# Patient Record
Sex: Female | Born: 1960
Health system: Southern US, Community
[De-identification: ages and names within clinical notes are randomized; demographics above are authoritative.]

## PROBLEM LIST (undated history)

## (undated) DIAGNOSIS — M8580 Other specified disorders of bone density and structure, unspecified site: Secondary | ICD-10-CM

## (undated) DIAGNOSIS — B019 Varicella without complication: Secondary | ICD-10-CM

## (undated) DIAGNOSIS — M1990 Unspecified osteoarthritis, unspecified site: Secondary | ICD-10-CM

## (undated) DIAGNOSIS — I2510 Atherosclerotic heart disease of native coronary artery without angina pectoris: Secondary | ICD-10-CM

## (undated) DIAGNOSIS — N200 Calculus of kidney: Secondary | ICD-10-CM

## (undated) DIAGNOSIS — I83813 Varicose veins of bilateral lower extremities with pain: Secondary | ICD-10-CM

## (undated) DIAGNOSIS — I10 Essential (primary) hypertension: Secondary | ICD-10-CM

## (undated) HISTORY — DX: Varicella without complication: B01.9

## (undated) HISTORY — DX: Atherosclerotic heart disease of native coronary artery without angina pectoris: I25.10

## (undated) HISTORY — DX: Varicose veins of bilateral lower extremities with pain: I83.813

## (undated) HISTORY — DX: Other specified disorders of bone density and structure, unspecified site: M85.80

## (undated) HISTORY — DX: Essential (primary) hypertension: I10

## (undated) HISTORY — DX: Unspecified osteoarthritis, unspecified site: M19.90

## (undated) HISTORY — DX: Calculus of kidney: N20.0

---

## 1984-11-24 HISTORY — PX: TUBAL LIGATION: SHX77

## 1997-05-16 ENCOUNTER — Emergency Department (HOSPITAL_COMMUNITY): Admission: EM | Admit: 1997-05-16 | Discharge: 1997-05-16 | Payer: Self-pay | Admitting: Emergency Medicine

## 2002-08-11 ENCOUNTER — Emergency Department (HOSPITAL_COMMUNITY): Admission: EM | Admit: 2002-08-11 | Discharge: 2002-08-11 | Payer: Self-pay | Admitting: Emergency Medicine

## 2002-08-11 ENCOUNTER — Encounter: Payer: Self-pay | Admitting: Emergency Medicine

## 2007-07-31 ENCOUNTER — Emergency Department (HOSPITAL_COMMUNITY): Admission: EM | Admit: 2007-07-31 | Discharge: 2007-07-31 | Payer: Self-pay | Admitting: Emergency Medicine

## 2010-10-30 LAB — URINALYSIS, ROUTINE W REFLEX MICROSCOPIC
Bilirubin Urine: NEGATIVE
Glucose, UA: NEGATIVE
Hgb urine dipstick: NEGATIVE
Ketones, ur: NEGATIVE
Nitrite: NEGATIVE
Protein, ur: NEGATIVE
Specific Gravity, Urine: 1.021
Urobilinogen, UA: 0.2
pH: 7

## 2010-10-30 LAB — URINE MICROSCOPIC-ADD ON

## 2010-10-30 LAB — PREGNANCY, URINE: Preg Test, Ur: NEGATIVE

## 2011-08-31 ENCOUNTER — Emergency Department (HOSPITAL_COMMUNITY)
Admission: EM | Admit: 2011-08-31 | Discharge: 2011-08-31 | Disposition: A | Discharge status: Home / Self Care | Payer: Federal, State, Local not specified - PPO | Attending: Emergency Medicine | Admitting: Emergency Medicine

## 2011-08-31 ENCOUNTER — Encounter (HOSPITAL_COMMUNITY): Payer: Self-pay | Admitting: Emergency Medicine

## 2011-08-31 ENCOUNTER — Emergency Department (HOSPITAL_COMMUNITY): Payer: Federal, State, Local not specified - PPO

## 2011-08-31 DIAGNOSIS — S93409A Sprain of unspecified ligament of unspecified ankle, initial encounter: Secondary | ICD-10-CM

## 2011-08-31 DIAGNOSIS — W07XXXA Fall from chair, initial encounter: Secondary | ICD-10-CM | POA: Insufficient documentation

## 2011-08-31 MED ORDER — HYDROCODONE-ACETAMINOPHEN 5-500 MG PO TABS: 1.0000 | ORAL_TABLET | Freq: Four times a day (QID) | ORAL | Status: AC | PRN

## 2011-08-31 MED ORDER — IBUPROFEN 800 MG PO TABS
800.0000 mg | ORAL_TABLET | Freq: Once | ORAL | Status: AC
  Administered 2011-08-31: 800 mg via ORAL
  Filled 2011-08-31: qty 1

## 2011-08-31 MED ORDER — IBUPROFEN 800 MG PO TABS: 800.0000 mg | ORAL_TABLET | Freq: Three times a day (TID) | ORAL | Status: AC

## 2011-08-31 MED ORDER — OXYCODONE-ACETAMINOPHEN 5-325 MG PO TABS: 2.0000 | ORAL_TABLET | Freq: Once | ORAL | Status: DC

## 2011-08-31 NOTE — ED Notes (Signed)
Pt states standing in chair to change her alarm battery and she fell off chair onto her right ankle

## 2011-08-31 NOTE — ED Provider Notes (Signed)
History     CSN: [id number]  Arrival date & time 08/31/11  1557   None     Chief Complaint  Patient presents with  . Ankle Pain    (Consider location/radiation/quality/duration/timing/severity/associated sxs/prior treatment) HPI  Patient presents to the emergency department with complaints of right ankle pain. She states that she was standing on a chair when she slipped off landing on her right ankle. She brought herself to the ER and walked inside. She states that she is able to walk but it hurts. This incident happened at 3 PM. She denies hitting her head or injuring her neck. NAD/ VSS. No obvious deformity.   History reviewed. No pertinent past medical history.  Past Surgical History  Procedure Date  . Tubal ligation     No family history on file.  History  Substance Use Topics  . Smoking status: Never Smoker   . Smokeless tobacco: Not on file  . Alcohol Use: No    OB History    Grav Para Term Preterm Abortions TAB SAB Ect Mult Living                  Review of Systems   HEENT: denies blurry vision or change in hearing PULMONARY: Denies difficulty breathing and SOB CARDIAC: denies chest pain or heart palpitations MUSCULOSKELETAL:  Pt unable to walk due to ankle pain ABDOMEN AL: denies abdominal pain GU: denies loss of bowel or urinary control NEURO: denies numbness and tingling in extremities SKIN: no new rashes PSYCH: patient denies anxiety or depression. NECK: Pt denies having neck pain     Allergies  Review of patient's allergies indicates no known allergies.  Home Medications   Current Outpatient Rx  Name Route Sig Dispense Refill  . CALCIUM + D PO Oral Take 1 tablet by mouth 2 (two) times daily.    . ADULT MULTIVITAMIN W/MINERALS CH Oral Take 1 tablet by mouth daily.    Marland Kitchen HYDROCODONE-ACETAMINOPHEN 5-500 MG PO TABS Oral Take 1 tablet by mouth every 6 (six) hours as needed for pain. 15 tablet 0  . IBUPROFEN 800 MG PO TABS Oral Take 1 tablet  (800 mg total) by mouth 3 (three) times daily. 21 tablet 0    BP 121/53  Pulse 71  Temp 98.4 F (36.9 C) (Oral)  Resp 20  SpO2 99%  Physical Exam  Nursing note and vitals reviewed. Constitutional: She appears well-developed and well-nourished. No distress.  HENT:  Head: Normocephalic and atraumatic.  Eyes: Pupils are equal, round, and reactive to light.  Neck: Normal range of motion. Neck supple.  Cardiovascular: Normal rate and regular rhythm.   Pulmonary/Chest: Effort normal.  Abdominal: Soft.  Musculoskeletal:       Right ankle: She exhibits decreased range of motion and swelling. She exhibits no ecchymosis, no deformity, no laceration and normal pulse. tenderness. Lateral malleolus tenderness found. No medial malleolus (no medial tenderness at all) tenderness found. Achilles tendon normal.       Feet:  Neurological: She is alert.  Skin: Skin is warm and dry.    ED Course  Procedures (including critical care time)  Labs Reviewed - No data to display Dg Ankle Complete Right  08/31/2011  *RADIOLOGY REPORT*  Clinical Data: Larey Seat today.  Diffuse ankle pain.  RIGHT ANKLE - COMPLETE 3+ VIEW  Comparison: None.  Findings: There is diffuse soft tissue swelling in the ankle. There is lucency at the medial malleolus, best seen on the oblique view.  This raises the  question of a minimally displaced fracture in this region.  The lateral malleolus appears intact.  Note is made of a plantar and Achilles calcaneal spurs.  IMPRESSION:  1.  Significant, diffuse soft tissue swelling. 2.  Possible medial malleolar fracture.  Original Report Authenticated By: Patterson Hammersmith, M.D.     1. Ankle sprain       MDM   Pt has no medial tenderness to correlate with xray results. I have reviewed adn discussed results with patient and still recommend ortho follow-up. Referral given. Pain meds given, R.I.C.E. Guidelines given. Pt given Cam Walker and Crutches as she does not feel comfortable being  only on crutches and not using both feet. Pt given note for work restrictions for 2 weeks as she lifts and walks for work.  Pt has been advised of the symptoms that warrant their return to the ED. Patient has voiced understanding and has agreed to follow-up with the PCP or specialist.         Dorthula Matas, PA 08/31/11 4540

## 2011-09-02 NOTE — ED Provider Notes (Signed)
Medical screening examination/treatment/procedure(s) were performed by non-physician practitioner and as supervising physician I was immediately available for consultation/collaboration.  Waseem Suess, MD 09/02/11 0006 

## 2016-09-10 DIAGNOSIS — D7389 Other diseases of spleen: Secondary | ICD-10-CM | POA: Diagnosis not present

## 2016-09-10 DIAGNOSIS — Z6839 Body mass index (BMI) 39.0-39.9, adult: Secondary | ICD-10-CM | POA: Diagnosis not present

## 2016-09-10 DIAGNOSIS — R112 Nausea with vomiting, unspecified: Secondary | ICD-10-CM | POA: Diagnosis not present

## 2016-09-10 DIAGNOSIS — K6389 Other specified diseases of intestine: Secondary | ICD-10-CM | POA: Diagnosis not present

## 2016-09-10 DIAGNOSIS — R188 Other ascites: Secondary | ICD-10-CM | POA: Diagnosis not present

## 2016-09-10 DIAGNOSIS — R197 Diarrhea, unspecified: Secondary | ICD-10-CM | POA: Diagnosis not present

## 2016-09-10 DIAGNOSIS — R1033 Periumbilical pain: Secondary | ICD-10-CM | POA: Diagnosis not present

## 2016-09-10 DIAGNOSIS — E669 Obesity, unspecified: Secondary | ICD-10-CM | POA: Diagnosis not present

## 2016-09-10 DIAGNOSIS — R1084 Generalized abdominal pain: Secondary | ICD-10-CM | POA: Diagnosis not present

## 2016-09-10 DIAGNOSIS — K529 Noninfective gastroenteritis and colitis, unspecified: Secondary | ICD-10-CM | POA: Diagnosis not present

## 2016-09-10 DIAGNOSIS — R1030 Lower abdominal pain, unspecified: Secondary | ICD-10-CM | POA: Diagnosis not present

## 2016-09-13 ENCOUNTER — Encounter (HOSPITAL_COMMUNITY): Payer: Self-pay | Admitting: Emergency Medicine

## 2016-09-13 ENCOUNTER — Emergency Department (HOSPITAL_COMMUNITY)
Admission: EM | Admit: 2016-09-13 | Discharge: 2016-09-13 | Disposition: A | Discharge status: Home / Self Care | Payer: Federal, State, Local not specified - PPO | Attending: Emergency Medicine | Admitting: Emergency Medicine

## 2016-09-13 DIAGNOSIS — Z79899 Other long term (current) drug therapy: Secondary | ICD-10-CM | POA: Diagnosis not present

## 2016-09-13 DIAGNOSIS — K529 Noninfective gastroenteritis and colitis, unspecified: Secondary | ICD-10-CM | POA: Diagnosis not present

## 2016-09-13 DIAGNOSIS — R197 Diarrhea, unspecified: Secondary | ICD-10-CM | POA: Diagnosis present

## 2016-09-13 LAB — COMPREHENSIVE METABOLIC PANEL
ALT: 18 U/L (ref 14–54)
AST: 24 U/L (ref 15–41)
Albumin: 3.1 g/dL — ABNORMAL LOW (ref 3.5–5.0)
Alkaline Phosphatase: 64 U/L (ref 38–126)
Anion gap: 8 (ref 5–15)
BUN: <5 mg/dL — ABNORMAL LOW (ref 6–20)
CO2: 26 mmol/L (ref 22–32)
Calcium: 8.7 mg/dL — ABNORMAL LOW (ref 8.9–10.3)
Chloride: 106 mmol/L (ref 101–111)
Creatinine, Ser: 0.92 mg/dL (ref 0.44–1.00)
GFR calc Af Amer: >60 mL/min (ref >60)
GFR calc non Af Amer: >60 mL/min (ref >60)
Glucose, Bld: 112 mg/dL — ABNORMAL HIGH (ref 65–99)
Potassium: 4 mmol/L (ref 3.5–5.1)
Sodium: 140 mmol/L (ref 135–145)
Total Bilirubin: 0.7 mg/dL (ref 0.3–1.2)
Total Protein: 6.6 g/dL (ref 6.5–8.1)

## 2016-09-13 LAB — URINALYSIS, ROUTINE W REFLEX MICROSCOPIC
Bilirubin Urine: NEGATIVE
Glucose, UA: NEGATIVE mg/dL
Ketones, ur: NEGATIVE mg/dL
Nitrite: NEGATIVE
Protein, ur: NEGATIVE mg/dL
RBC / HPF: NONE SEEN RBC/hpf (ref 0–5)
Specific Gravity, Urine: 1.002 — ABNORMAL LOW (ref 1.005–1.030)
pH: 6 (ref 5.0–8.0)

## 2016-09-13 LAB — CBC
HCT: 33.2 % — ABNORMAL LOW (ref 36.0–46.0)
Hemoglobin: 11.1 g/dL — ABNORMAL LOW (ref 12.0–15.0)
MCH: 28.7 pg (ref 26.0–34.0)
MCHC: 33.4 g/dL (ref 30.0–36.0)
MCV: 85.8 fL (ref 78.0–100.0)
Platelets: 281 10*3/uL (ref 150–400)
RBC: 3.87 MIL/uL (ref 3.87–5.11)
RDW: 13.9 % (ref 11.5–15.5)
WBC: 8.2 10*3/uL (ref 4.0–10.5)

## 2016-09-13 LAB — LIPASE, BLOOD: Lipase: 38 U/L (ref 11–51)

## 2016-09-13 MED ORDER — SODIUM CHLORIDE 0.9 % IV BOLUS (SEPSIS)
1000.0000 mL | Freq: Once | INTRAVENOUS | Status: AC
  Administered 2016-09-13: 1000 mL via INTRAVENOUS

## 2016-09-13 MED ORDER — LOPERAMIDE HCL 2 MG PO CAPS: 2.0000 mg | ORAL_CAPSULE | Freq: Four times a day (QID) | ORAL | 0 refills | Status: DC | PRN

## 2016-09-13 MED ORDER — LOPERAMIDE HCL 2 MG PO CAPS
4.0000 mg | ORAL_CAPSULE | ORAL | Status: DC | PRN
  Administered 2016-09-13: 4 mg via ORAL
  Filled 2016-09-13: qty 2

## 2016-09-13 NOTE — ED Triage Notes (Signed)
Reports n/v/d that started Thursday.  Seen in [place] on Thursday.  Diagnosed with colitis.  Reports pain relieved with Norco but comes right back.  Nausea relieved but still having diarrhea.

## 2016-09-13 NOTE — ED Provider Notes (Signed)
[facility]-EMERGENCY DEPT Provider Note   CSN: [id number] Arrival date & time: 09/13/16  0520     History   Chief Complaint Chief Complaint  Patient presents with  . Nausea  . Emesis    HPI Cheyenne Hancock is a 56 y.o. female.  HPI Patient presents to the emergency department with diarrhea.  The patient states that this past Tuesday she started with nausea, vomiting, diarrhea with abdominal discomfort.  The patient states that her vomiting and nausea stopped Thursday morning before she went to the hospital She states on Thursday she was seen at another hospital.  She states it did CT scan and blood work.  She states that they sent her home with pain medications, but no other medications.  The patient states that nothing seems make the condition better or worse.  She states she has 4-5 loose bowel movements a day.  The patient states that she did not take any other medications prior to arrival. The patient denies chest pain, shortness of breath, headache,blurred vision, neck pain, fever, cough, weakness, numbness, dizziness, anorexia, edema, abdominal pain, nausea, vomiting,  rash, back pain, dysuria, hematemesis, bloody stool, near syncope, or syncope. History reviewed. No pertinent past medical history.  There are no active problems to display for this patient.   Past Surgical History:  Procedure Laterality Date  . TUBAL LIGATION      OB History    No data available       Home Medications    Prior to Admission medications   Medication Sig Start Date End Date Taking? Authorizing Provider  Calcium Carbonate-Vitamin D (CALCIUM + D PO) Take 1 tablet by mouth 2 (two) times daily.   Yes [provider]  HYDROcodone-acetaminophen (NORCO/VICODIN) 5-325 MG tablet Take 1 tablet by mouth every 6 (six) hours as needed for moderate pain.   Yes [provider]  Multiple Vitamin (MULTIVITAMIN WITH MINERALS) TABS Take 1 tablet by mouth daily.   Yes [provider]    nabumetone (RELAFEN) 750 MG tablet Take 750 mg by mouth 2 (two) times daily with a meal. 08/07/16  Yes [provider]    Family History No family history on file.  Social History Social History  Substance Use Topics  . Smoking status: Never Smoker  . Smokeless tobacco: Never Used  . Alcohol use No     Allergies   Patient has no known allergies.   Review of Systems Review of Systems All other systems negative except as documented in the HPI. All pertinent positives and negatives as reviewed in the HPI.  Physical Exam Updated Vital Signs BP (!) 146/67 (BP Location: Left Arm)   Pulse 81   Temp 99 F (37.2 C) (Oral)   Resp 18   Ht 5\' 3"  (1.6 m)   Wt 95.3 kg (210 lb)   SpO2 94%   BMI 37.20 kg/m   Physical Exam  Constitutional: She is oriented to person, place, and time. She appears well-developed and well-nourished. No distress.  HENT:  Head: Normocephalic and atraumatic.  Mouth/Throat: Oropharynx is clear and moist.  Eyes: Pupils are equal, round, and reactive to light.  Neck: Normal range of motion. Neck supple.  Cardiovascular: Normal rate, regular rhythm and normal heart sounds.  Exam reveals no gallop and no friction rub.   No murmur heard. Pulmonary/Chest: Effort normal and breath sounds normal. No respiratory distress. She has no wheezes.  Abdominal: Soft. Bowel sounds are normal. She exhibits no distension. There is no tenderness.  Neurological: She is alert and oriented to person, place, and time. She exhibits normal muscle tone. Coordination normal.  Skin: Skin is warm and dry. Capillary refill takes less than 2 seconds. No rash noted. No erythema.  Psychiatric: She has a normal mood and affect. Her behavior is normal.  Nursing note and vitals reviewed.    ED Treatments / Results  Labs (all labs ordered are listed, but only abnormal results are displayed) Labs Reviewed  COMPREHENSIVE METABOLIC PANEL - Abnormal; Notable for the following:        Result Value   Glucose, Bld 112 (*)    BUN <5 (*)    Calcium 8.7 (*)    Albumin 3.1 (*)    All other components within normal limits  CBC - Abnormal; Notable for the following:    Hemoglobin 11.1 (*)    HCT 33.2 (*)    All other components within normal limits  URINALYSIS, ROUTINE W REFLEX MICROSCOPIC - Abnormal; Notable for the following:    Color, Urine STRAW (*)    Specific Gravity, Urine 1.002 (*)    Hgb urine dipstick SMALL (*)    Leukocytes, UA TRACE (*)    Bacteria, UA RARE (*)    Squamous Epithelial / LPF 0-5 (*)    All other components within normal limits  LIPASE, BLOOD    EKG  EKG Interpretation None       Radiology No results found.  Procedures Procedures (including critical care time)  Medications Ordered in ED Medications  loperamide (IMODIUM) capsule 4 mg (4 mg Oral Given 09/13/16 1042)  sodium chloride 0.9 % bolus 1,000 mL (1,000 mLs Intravenous New Bag/Given 09/13/16 0919)     Initial Impression / Assessment and Plan / ED Course  I have reviewed the triage vital signs and the nursing notes.  Pertinent labs & imaging results that were available during my care of the patient were reviewed by me and considered in my medical decision making (see chart for details).     The patient is given IV fluids here in the emergency department do feel the patient has a gastroenteritis with some continued diarrhea.  The patient will be advised to follow-up with her primary care Dr. told to return here as needed.  The patient is not had any vomiting.  She does not have any current abdominal pain.  I reviewed the CT scan from the previous hospital visit and there was no significant findings.  There was some mild colonic wall thickening that they noted was possible but felt this is related to under distention of the bowel and not any other acute process.  Patient is advised to increase her fluid intake and rest as much possible.  Patient is advised return here for any  worsening in her condition  Final Clinical Impressions(s) / ED Diagnoses   Final diagnoses:  None    New Prescriptions New Prescriptions   No medications on file     Charlestine Night, Cordelia Poche 09/13/16 1154    Shaune Pollack, MD 09/15/16 1239

## 2016-09-13 NOTE — Discharge Instructions (Signed)
Slowly increase her fluid intake, rest as much as possible.  Follow-up with your primary doctor.  Return here as needed.  Your testing here today did not show any significant abnormalities

## 2016-09-15 DIAGNOSIS — H209 Unspecified iridocyclitis: Secondary | ICD-10-CM | POA: Diagnosis not present

## 2016-09-15 DIAGNOSIS — H53149 Visual discomfort, unspecified: Secondary | ICD-10-CM | POA: Diagnosis not present

## 2016-09-15 DIAGNOSIS — H5713 Ocular pain, bilateral: Secondary | ICD-10-CM | POA: Diagnosis not present

## 2016-09-15 DIAGNOSIS — H1032 Unspecified acute conjunctivitis, left eye: Secondary | ICD-10-CM | POA: Diagnosis not present

## 2016-09-18 DIAGNOSIS — H2000 Unspecified acute and subacute iridocyclitis: Secondary | ICD-10-CM | POA: Diagnosis not present

## 2016-09-18 DIAGNOSIS — H35032 Hypertensive retinopathy, left eye: Secondary | ICD-10-CM | POA: Diagnosis not present

## 2016-09-18 DIAGNOSIS — H2513 Age-related nuclear cataract, bilateral: Secondary | ICD-10-CM | POA: Diagnosis not present

## 2016-09-24 ENCOUNTER — Ambulatory Visit: Payer: Federal, State, Local not specified - PPO | Admitting: Family Medicine

## 2016-10-02 DIAGNOSIS — H35032 Hypertensive retinopathy, left eye: Secondary | ICD-10-CM | POA: Diagnosis not present

## 2016-10-02 DIAGNOSIS — H2000 Unspecified acute and subacute iridocyclitis: Secondary | ICD-10-CM | POA: Diagnosis not present

## 2016-10-02 DIAGNOSIS — H2513 Age-related nuclear cataract, bilateral: Secondary | ICD-10-CM | POA: Diagnosis not present

## 2016-10-06 ENCOUNTER — Encounter: Payer: Self-pay | Admitting: Family Medicine

## 2016-10-06 ENCOUNTER — Ambulatory Visit (INDEPENDENT_AMBULATORY_CARE_PROVIDER_SITE_OTHER): Payer: Federal, State, Local not specified - PPO | Admitting: Family Medicine

## 2016-10-06 ENCOUNTER — Encounter: Payer: Self-pay | Admitting: Gastroenterology

## 2016-10-06 VITALS — BP 136/80 | HR 79 | Resp 12 | Ht 63.0 in | Wt 209.1 lb

## 2016-10-06 DIAGNOSIS — R739 Hyperglycemia, unspecified: Secondary | ICD-10-CM | POA: Diagnosis not present

## 2016-10-06 DIAGNOSIS — Z1239 Encounter for other screening for malignant neoplasm of breast: Secondary | ICD-10-CM

## 2016-10-06 DIAGNOSIS — Z6837 Body mass index (BMI) 37.0-37.9, adult: Secondary | ICD-10-CM | POA: Diagnosis not present

## 2016-10-06 DIAGNOSIS — E669 Obesity, unspecified: Secondary | ICD-10-CM

## 2016-10-06 DIAGNOSIS — I10 Essential (primary) hypertension: Secondary | ICD-10-CM | POA: Diagnosis not present

## 2016-10-06 DIAGNOSIS — R1030 Lower abdominal pain, unspecified: Secondary | ICD-10-CM

## 2016-10-06 DIAGNOSIS — R194 Change in bowel habit: Secondary | ICD-10-CM | POA: Diagnosis not present

## 2016-10-06 DIAGNOSIS — Z1231 Encounter for screening mammogram for malignant neoplasm of breast: Secondary | ICD-10-CM | POA: Diagnosis not present

## 2016-10-06 LAB — TSH: TSH: 1.74 u[IU]/mL (ref 0.35–4.50)

## 2016-10-06 LAB — C-REACTIVE PROTEIN: CRP: 1.1 mg/dL (ref 0.5–20.0)

## 2016-10-06 LAB — SEDIMENTATION RATE: Sed Rate: 22 mm/hr (ref 0–30)

## 2016-10-06 LAB — HEMOGLOBIN A1C: Hgb A1c MFr Bld: 5.6 % (ref 4.6–6.5)

## 2016-10-06 MED ORDER — DICYCLOMINE HCL 10 MG PO CAPS: 10.0000 mg | ORAL_CAPSULE | Freq: Three times a day (TID) | ORAL | 0 refills | Status: DC

## 2016-10-06 NOTE — Progress Notes (Signed)
HPI:   [name] is a 56 y.o. female, who is here today to establish care.  Former PCP: Dr Althea Grimmer Last preventive routine visit: 2010  Chronic medical problems: HTN, borderline diabetes,obesity and uveitis. She is currently following with ophthalmologist, every 2-3 months, left eye uveitis.  She follows with weight loss clinic, on 08/26/2016 she was started on Phentermine 37.5 mg. She discontinued medication after she started with GI issues. She is also reporting HCG injections x 2 given at the [facility].   HTN: Dx around 2009-2010. According to patient, pharmacologic treatment was recommended but she refused. Her BP has been well controlled with nonpharmacologic treatment, weight loss and low salt diet.  Currently she has not been consistent with regular exercise. Denies severe/frequent headache, visual changes, chest pain, dyspnea, palpitation, claudication, focal weakness, or edema.   Concerns today:  Abdominal pain:  She has been in the ER a couple times ( 09/10/2016 and 09/21/2016) because abdominal pain associated with diarrhea, nausea, and vomiting. She was instructed to arrange appointment with GI, Dr. Wynelle Beckmann, she didn't do so. Hydrocodone- Acetaminophen 5-325 mg and Loperamide 2 mg were recommended at the ER visit; the latter she has not taken lately.  She is reporting last colonoscopy in 2010, negative per patient report.  She has had intermittently abdominal lower abdominal pain that started a months ago, around 09/03/2016.  Sharp pain, not radiated, 10/10, 2-3 episodes per day. Pain has been stable.  She has not identified exacerbating factors. Alleviated by defecation.  Diarrhea has improved but she is still having a bowel movement daily instead her normal q 2-3 days. + Clear mucus. Nausea and vomiting resolved. She denies prior history. She denies antibiotic treatment for the past few months or history of overseas travel. No  sick contact.   When inquired about blood in his stool, she reports having about 3 episodes of red stools and? of blood on tissue. She thought it was related to watermelon intake.Denies dyschezia.  FHx for colon cancer or IBD negative.  09/10/16 CT ABDOMEN PELVIS W IV CONTRAST   1. Mild colonic wall thickening favored to be incidental/related to under distention rather than acute colitis. There is no adjacent inflammatory change.  2. Trace nonspecific pelvic fluid.   Lab Results  Component Value Date   ALT 18 09/13/2016   AST 24 09/13/2016   ALKPHOS 64 09/13/2016   BILITOT 0.7 09/13/2016   Lab Results  Component Value Date   CREATININE 0.92 09/13/2016   BUN <5 (L) 09/13/2016   NA 140 09/13/2016   K 4.0 09/13/2016   CL 106 09/13/2016   CO2 26 09/13/2016   Lab Results  Component Value Date   WBC 8.2 09/13/2016   HGB 11.1 (L) 09/13/2016   HCT 33.2 (L) 09/13/2016   MCV 85.8 09/13/2016   PLT 281 09/13/2016   Reviewing records, it seems like she had similar symptoms 03/09/2013.   -She is also requesting thyroid check, she denies history of thyroid disease. She is concerned because positive FHx for hyper and hypothyroidism and also noticing hair thinning for the past few months. She has not noted abnormal weight loss, tremor, palpitations, cold/heat intolerance.   Review of Systems  Constitutional: Negative for activity change, appetite change, fatigue, fever and unexpected weight change.  HENT: Negative for mouth sores, nosebleeds and trouble swallowing.   Eyes: Negative for redness and visual disturbance.  Respiratory: Negative for cough, shortness of breath and wheezing.   Cardiovascular: Negative  for chest pain, palpitations and leg swelling.  Gastrointestinal: Positive for abdominal pain and diarrhea. Negative for nausea and vomiting.       Negative for changes in bowel habits.  Endocrine: Negative for cold intolerance, heat intolerance, polydipsia, polyphagia and  polyuria.  Genitourinary: Negative for decreased urine volume, difficulty urinating, dysuria and hematuria.  Musculoskeletal: Negative for gait problem and myalgias.  Skin: Negative for pallor and rash.  Neurological: Negative for syncope, weakness, numbness and headaches.  Hematological: Negative for adenopathy. Does not bruise/bleed easily.  Psychiatric/Behavioral: Negative for confusion. The patient is not nervous/anxious.       Current Outpatient Prescriptions on File Prior to Visit  Medication Sig Dispense Refill  . Calcium Carbonate-Vitamin D (CALCIUM + D PO) Take 1 tablet by mouth 2 (two) times daily.    . Multiple Vitamin (MULTIVITAMIN WITH MINERALS) TABS Take 1 tablet by mouth daily.    . nabumetone (RELAFEN) 750 MG tablet Take 750 mg by mouth 2 (two) times daily with a meal.  1   No current facility-administered medications on file prior to visit.     Past Medical History:  Diagnosis Date  . Chicken pox   . Hypertension    Dx 2009-2010  . Kidney stones   . Osteopenia    DEXA 11/22/08  . Varicose veins of bilateral lower extremities with pain    No Known Allergies  Family History  Problem Relation Age of Onset  . Arthritis Mother   . Stroke Mother   . Hypertension Mother   . Cancer Sister        breast  . Cancer Paternal Uncle        lung and prostate    Social History   Social History  . Marital status: Single    Spouse name: N/A  . Number of children: N/A  . Years of education: N/A   Social History Main Topics  . Smoking status: Never Smoker  . Smokeless tobacco: Never Used  . Alcohol use No  . Drug use: No  . Sexual activity: Not Asked   Other Topics Concern  . None   Social History Narrative  . None    Vitals:   10/06/16 1120  BP: 136/80  Pulse: 79  Resp: 12  SpO2: 96%    Body mass index is 37.04 kg/m.   Physical Exam  Nursing note and vitals reviewed. Constitutional: She is oriented to person, place, and time. She appears  well-developed. No distress.  HENT:  Head: Normocephalic and atraumatic.  Mouth/Throat: Oropharynx is clear and moist and mucous membranes are normal.  Eyes: Pupils are equal, round, and reactive to light. Conjunctivae and EOM are normal.  Neck: No tracheal deviation present. No thyroid mass and no thyromegaly present.  Cardiovascular: Normal rate and regular rhythm.   No murmur heard. Pulses:      Dorsalis pedis pulses are 2+ on the right side, and 2+ on the left side.  Respiratory: Effort normal and breath sounds normal. No respiratory distress.  GI: Soft. Bowel sounds are normal. She exhibits no mass. There is no hepatomegaly. There is tenderness in the periumbilical area. There is no rigidity and no guarding.  Hyperactive bowel sounds.  Musculoskeletal: She exhibits no edema or tenderness.  Lymphadenopathy:    She has no cervical adenopathy.  Neurological: She is alert and oriented to person, place, and time. She has normal strength. Coordination and gait normal.  Skin: Skin is warm. No erythema.  Psychiatric: She has a  normal mood and affect.  Well groomed, good eye contact.    ASSESSMENT AND PLAN:   Ms. Cheyenne Hancock was seen today for establish care.  Diagnoses and all orders for this visit:  Abdominal pain, lower  We discussed possible diagnoses: IBS, infections, inflammatory, and endocrine among some. Referral to GI was placed today. Bentyl 10 mg 3 times per day recommended, some side effects discussed. Clearly instructed about warning signs. Further recommendations will be given according to lab results.  -     TSH -     Ambulatory referral to Gastroenterology -     Sedimentation rate -     C-reactive protein -     dicyclomine (BENTYL) 10 MG capsule; Take 1 capsule (10 mg total) by mouth 3 (three) times daily before meals.  Change in bowel habit  Diarrhea has improved. Because question of blood in stool, she may need colonoscopy. I don't have report of her last  one. Instructed about warning signs.  -     TSH -     Ambulatory referral to Gastroenterology -     dicyclomine (BENTYL) 10 MG capsule; Take 1 capsule (10 mg total) by mouth 3 (three) times daily before meals.  Hypertension, essential, benign  Adequately controlled on non-pharmacologic treatment. DASH and low salt diet to continue. F/U in 6-12 months, before if needed.  Hyperglycemia  Primary prevention through a healthy lifestyle recommended. Further recommendations would be given according to lab results.  -     Hemoglobin A1c  Breast cancer screening -     MM SCREENING BREAST TOMO BILATERAL; Future  Class 2 obesity without serious comorbidity with body mass index (BMI) of 37.0 to 37.9 in adult, unspecified obesity type  We discussed benefits of wt loss as well as adverse effects of obesity. Consistency with healthy diet and physical activity recommended. She will continue following with [facility].    Sharese Manrique G. [place], MD  [facility]. [place] office.

## 2016-10-06 NOTE — Patient Instructions (Addendum)
A few things to remember from today's visit:   Breast cancer screening - Plan: MM SCREENING BREAST TOMO BILATERAL  Hypertension, essential, benign  Hyperglycemia - Plan: Hemoglobin A1c  Abdominal pain, lower - Plan: TSH, Ambulatory referral to Gastroenterology, Sedimentation rate, C-reactive protein  Change in bowel habit - Plan: TSH, Ambulatory referral to Gastroenterology     Follow a bland diet for the next few days, small meals, adequate hydration.   GET HELP RIGHT AWAY IF:   The pain is does not go away within 2 hours.  Sudden severe/worsening pain.  You keep throwing up (vomiting).  The pain changes and is only in the right or left part of the belly.  Not being able to pass gas or poop.  You have bloody or tarry looking poop.   MAKE SURE YOU:   Understand these instructions.  Will watch your condition.  Will get help right away if you are not doing well or get worse.   If symptoms are persistent please arrange a follow up appointment.    Please be sure medication list is accurate. If a new problem present, please set up appointment sooner than planned today.

## 2016-10-07 ENCOUNTER — Telehealth: Payer: Self-pay | Admitting: Family Medicine

## 2016-10-07 NOTE — Telephone Encounter (Signed)
Are her labs back yet?

## 2016-10-07 NOTE — Telephone Encounter (Signed)
Informed patient of results and patient verbalized understanding.  

## 2016-10-07 NOTE — Telephone Encounter (Signed)
Pt states she got a call from our number, but I do not see any notes. Pt did have labs yesterday, but not reviewed yet.  Pt would like a call back for results anyway.

## 2016-10-07 NOTE — Telephone Encounter (Signed)
Please inform pt we will let her know when results are back, they were just done yesterday.  Thanks, BJ

## 2016-10-23 DIAGNOSIS — H2000 Unspecified acute and subacute iridocyclitis: Secondary | ICD-10-CM | POA: Diagnosis not present

## 2016-10-23 DIAGNOSIS — H35032 Hypertensive retinopathy, left eye: Secondary | ICD-10-CM | POA: Diagnosis not present

## 2016-10-23 DIAGNOSIS — H2513 Age-related nuclear cataract, bilateral: Secondary | ICD-10-CM | POA: Diagnosis not present

## 2016-10-29 ENCOUNTER — Ambulatory Visit
Admission: RE | Admit: 2016-10-29 | Discharge: 2016-10-29 | Disposition: A | Discharge status: Home / Self Care | Payer: Federal, State, Local not specified - PPO | Source: Ambulatory Visit | Attending: Family Medicine | Admitting: Family Medicine

## 2016-10-29 DIAGNOSIS — Z1231 Encounter for screening mammogram for malignant neoplasm of breast: Secondary | ICD-10-CM | POA: Diagnosis not present

## 2016-10-29 DIAGNOSIS — Z1239 Encounter for other screening for malignant neoplasm of breast: Secondary | ICD-10-CM

## 2016-11-12 ENCOUNTER — Other Ambulatory Visit: Payer: Federal, State, Local not specified - PPO

## 2016-11-12 ENCOUNTER — Ambulatory Visit (INDEPENDENT_AMBULATORY_CARE_PROVIDER_SITE_OTHER): Payer: Federal, State, Local not specified - PPO | Admitting: Gastroenterology

## 2016-11-12 ENCOUNTER — Encounter: Payer: Self-pay | Admitting: Gastroenterology

## 2016-11-12 VITALS — BP 110/78 | HR 74 | Ht 62.0 in | Wt 211.0 lb

## 2016-11-12 DIAGNOSIS — K529 Noninfective gastroenteritis and colitis, unspecified: Secondary | ICD-10-CM

## 2016-11-12 DIAGNOSIS — R1030 Lower abdominal pain, unspecified: Secondary | ICD-10-CM

## 2016-11-12 DIAGNOSIS — R195 Other fecal abnormalities: Secondary | ICD-10-CM

## 2016-11-12 MED ORDER — NA SULFATE-K SULFATE-MG SULF 17.5-3.13-1.6 GM/177ML PO SOLN: 1.0000 | Freq: Once | ORAL | 0 refills | Status: AC

## 2016-11-12 NOTE — Progress Notes (Signed)
[facility] Consult Note:  History: Cheyenne Hancock 11/12/2016  Referring physician: Martinique, Betty G, MD  Reason for consult/chief complaint: Abdominal Pain (lower pelvic location) and Blood In Stools (BRB, small amt. Pt has seen on toilet paper. Seen blood last night)   Subjective  HPI:  This is a 56 year old woman referred by primary care for abdominal pain and diarrhea. 2 months ago she had the rather acute onset of crampy bandlike lower abdominal pain that was worsening over a few days and finally brought her to an outside EGD. [name] believed it may have been prompted by a [place] and fruit diets she had been on for several days. We don't have access to all the records from that ED visit, but I do have a CT scan report. It indicates some questionable thickening of the transverse and descending colon, but the radiologist favored that to be likely from underdistention. There was no surrounding inflammation. The patient reports that with this acute illness there was vomiting that lasted about 2 days and finally subsided. Now she is left with lower abdominal cramps and diarrhea. She has 3-5 semi-formed to loose nonbloody stools per day with occasional nocturnal diarrhea. Sometimes there are some "streaks of blood", but that sounds as if it is mostly on the paper, there is been no frank rectal bleeding. She was taking phentermine for about 3 weeks prior to the ED visit, and stopped it at that time. She was then in the [facility] ED 3 days after that for the same symptoms, no repeat imaging was done, and she was given dicyclomine. That medicine seems to help with the abdominal cramps and diarrhea. However, her symptoms persist she says this is a definite change for her. She had not had any previous foreign travel or antibiotic use in at least 2 months prior to the onset of these symptoms.  ROS:  Review of Systems  Constitutional: Negative for appetite change and  unexpected weight change.  HENT: Negative for mouth sores and voice change.   Eyes: Negative for pain and redness.  Respiratory: Negative for cough and shortness of breath.   Cardiovascular: Negative for chest pain and palpitations.  Genitourinary: Negative for dysuria and hematuria.  Musculoskeletal: Negative for arthralgias and myalgias.  Skin: Negative for pallor and rash.  Neurological: Negative for weakness and headaches.  Hematological: Negative for adenopathy.     Past Medical History: Past Medical History:  Diagnosis Date  . Chicken pox   . Hypertension    Dx 2009-2010  . Kidney stones   . Osteopenia    DEXA 11/22/08  . Varicose veins of bilateral lower extremities with pain      Past Surgical History: Past Surgical History:  Procedure Laterality Date  . TUBAL LIGATION  11/24/1984     Family History: Family History  Problem Relation Age of Onset  . Arthritis Mother   . Stroke Mother   . Hypertension Mother   . Cancer Sister        breast  . Breast cancer Sister   . Cancer Paternal Uncle        lung and prostate  . Breast cancer Maternal Grandmother     Social History: Social History   Social History  . Marital status: Divorced    Spouse name: N/A  . Number of children: 2  . Years of education: N/A   Social History Main Topics  . Smoking status: Never Smoker  . Smokeless tobacco: Never Used  .  Alcohol use No  . Drug use: No  . Sexual activity: Not Asked   Other Topics Concern  . None   Social History Narrative  . None    Allergies: No Known Allergies  Outpatient Meds: Current Outpatient Prescriptions  Medication Sig Dispense Refill  . Biotin 10000 MCG TABS Take 1 tablet by mouth daily.    . Multiple Vitamin (MULTIVITAMIN WITH MINERALS) TABS Take 1 tablet by mouth daily.    Marland Kitchen PREDNISOLONE ACETATE OP Apply to eye.    . dicyclomine (BENTYL) 10 MG capsule Take 1 capsule (10 mg total) by mouth 3 (three) times daily before meals. 45  capsule 0  . Na Sulfate-K Sulfate-Mg Sulf 17.5-3.13-1.6 GM/180ML SOLN Take 1 kit by mouth once. 354 mL 0   No current facility-administered medications for this visit.       ___________________________________________________________________ Objective   Exam:  BP 110/78   Pulse 74   Ht '5\' 2"'  (1.575 m)   Wt 211 lb (95.7 kg)   BMI 38.59 kg/m    General: this is a(n) obese and well-appearing woman   Eyes: sclera anicteric, no redness  ENT: oral mucosa moist without lesions, no cervical or supraclavicular lymphadenopathy, good dentition  CV: RRR without murmur, S1/S2, no JVD, no peripheral edema  Resp: clear to auscultation bilaterally, normal RR and effort noted  GI: soft, no tenderness, with active bowel sounds. No guarding or palpable organomegaly noted.  Skin; warm and dry, no rash or jaundice noted  Neuro: awake, alert and oriented x 3. Normal gross motor function and fluent speech Rectal:  Thickened posterior fold versus palpation of the pelvic brim., scant stool, heme positive.   Labs:  CBC Latest Ref Rng & Units 09/13/2016  WBC 4.0 - 10.5 K/uL 8.2  Hemoglobin 12.0 - 15.0 g/dL 11.1(L)  Hematocrit 36.0 - 46.0 % 33.2(L)  Platelets 150 - 400 K/uL 281   CMP Latest Ref Rng & Units 09/13/2016  Glucose 65 - 99 mg/dL 112(H)  BUN 6 - 20 mg/dL <5(L)  Creatinine 0.44 - 1.00 mg/dL 0.92  Sodium 135 - 145 mmol/L 140  Potassium 3.5 - 5.1 mmol/L 4.0  Chloride 101 - 111 mmol/L 106  CO2 22 - 32 mmol/L 26  Calcium 8.9 - 10.3 mg/dL 8.7(L)  Total Protein 6.5 - 8.1 g/dL 6.6  Total Bilirubin 0.3 - 1.2 mg/dL 0.7  Alkaline Phos 38 - 126 U/L 64  AST 15 - 41 U/L 24  ALT 14 - 54 U/L 18     Radiologic Studies:  See CT.  ED 8/9/ and 8/12  Assessment: Encounter Diagnoses  Name Primary?  . Lower abdominal pain Yes  . Chronic diarrhea   . Heme positive stool     She has not had stool studies to rule out infection. If negative, this could be IBD or neoplasia. Phentermine  use and findings on initial CT suggested could've been ischemic colitis at that time, but there was no frank lower GI bleeding and that should not causing persistent symptoms. Postinfectious syndrome is a consideration, but would not explain heme positive stool.We have ordered ova and parasites and C. Difficile PCR. I've also scheduled her colonoscopy for the week after next.That will allow [place] time to get the stool studies back before her procedure.  Colonoscopy described in detail and she is agreeable.  The benefits and risks of the planned procedure were described in detail with the patient or (when appropriate) their health care proxy.  Risks were outlined as including,  but not limited to, bleeding, infection, perforation, adverse medication reaction leading to cardiac or pulmonary decompensation, or pancreatitis (if ERCP).  The limitation of incomplete mucosal visualization was also discussed.  No guarantees or warranties were given.   Thank you for the courtesy of this consult.  Please call me with any questions or concerns.  Nelida Meuse III  CC: [place], Betty G, MD

## 2016-11-12 NOTE — Patient Instructions (Signed)
If you are age 58 or older, your body mass index should be between 23-30. Your Body mass index is 38.59 kg/m. If this is out of the aforementioned range listed, please consider follow up with your Primary Care Provider.  If you are age 35 or younger, your body mass index should be between 19-25. Your Body mass index is 38.59 kg/m. If this is out of the aformentioned range listed, please consider follow up with your Primary Care Provider.   You have been scheduled for a colonoscopy. Please follow written instructions given to you at your visit today.  Please pick up your prep supplies at the pharmacy within the next 1-3 days. If you use inhalers (even only as needed), please bring them with you on the day of your procedure. Your physician has requested that you go to www.startemmi.com and enter the access code given to you at your visit today. This web site gives a general overview about your procedure. However, you should still follow specific instructions given to you by our office regarding your preparation for the procedure.  Thank you for choosing [facility]  Dr Amada Jupiter III

## 2016-11-13 ENCOUNTER — Other Ambulatory Visit: Payer: Federal, State, Local not specified - PPO

## 2016-11-13 DIAGNOSIS — R1030 Lower abdominal pain, unspecified: Secondary | ICD-10-CM

## 2016-11-13 DIAGNOSIS — K529 Noninfective gastroenteritis and colitis, unspecified: Secondary | ICD-10-CM

## 2016-11-13 DIAGNOSIS — R195 Other fecal abnormalities: Secondary | ICD-10-CM

## 2016-11-14 LAB — CLOSTRIDIUM DIFFICILE BY PCR: Toxigenic C. Difficile by PCR: DETECTED — AB

## 2016-11-16 ENCOUNTER — Other Ambulatory Visit: Payer: Self-pay

## 2016-11-16 LAB — OVA AND PARASITE EXAMINATION
CONCENTRATE RESULT:: NONE SEEN
MICRO NUMBER:: 81140136
SPECIMEN QUALITY:: ADEQUATE
TRICHROME RESULT:: NONE SEEN

## 2016-11-16 MED ORDER — VANCOMYCIN HCL 125 MG PO CAPS: 125.0000 mg | ORAL_CAPSULE | Freq: Four times a day (QID) | ORAL | 0 refills | Status: DC

## 2016-11-24 ENCOUNTER — Encounter: Payer: Federal, State, Local not specified - PPO | Admitting: Gastroenterology

## 2016-11-30 ENCOUNTER — Other Ambulatory Visit: Payer: Self-pay

## 2016-11-30 ENCOUNTER — Telehealth: Payer: Self-pay | Admitting: Gastroenterology

## 2016-11-30 NOTE — Telephone Encounter (Signed)
Patient has finished the antibiotics for C diff, she states she is feeling much better. Diarrhea stopped around day 10 of antibiotic course. Please advise when we can safely reschedule her colonoscopy. Thanks.

## 2016-12-01 NOTE — Telephone Encounter (Signed)
Spoke to patient, she is still doing okay. She understands to contact our office should her symptoms return. I have put a reminder in the system to contact patient at the end of November and will discuss scheduling her screening colonoscopy then.

## 2016-12-01 NOTE — Telephone Encounter (Signed)
She no longer needs the colonoscopy in order to find the cause of the symptoms since we discovered and treated the C diff and the symptoms are gone.  However, as I recall,.she has not had a screening colonoscopy.  If she would like to schedule a routine colonoscopy for colon cancer screening, that is fine.  If so, it should wait a few weeks more to allow the colon inflammation to completely resolve.  It's up to her, whatever timing is convenient for her.  If diarrhea and abdominal cramps should recur before the date of that procedure, she must let us know because some patients can have a relapse of C diff.

## 2016-12-01 NOTE — Telephone Encounter (Signed)
Left message for patient to call back  

## 2016-12-03 ENCOUNTER — Other Ambulatory Visit: Payer: Self-pay

## 2016-12-03 ENCOUNTER — Telehealth: Payer: Self-pay | Admitting: Gastroenterology

## 2016-12-03 MED ORDER — VANCOMYCIN HCL 125 MG PO CAPS: ORAL_CAPSULE | ORAL | 0 refills | Status: DC

## 2016-12-03 NOTE — Telephone Encounter (Signed)
If she is having the same symptoms as prior C diff infection, this is more than likely recurrence / relapse of C diff. It appears she completed a course of oral vancomycin initially, if that is the case her options are another pulsed dose course of Vancomycin versus Fidaxomycin which is much more expensive. Dr. Myrtie Neitheranis had recommended a probiotic which she should also be taking.   Recommend the following: Vancomycin pulsed-tapered regimen:  125 mg orally four times daily for 10 then  125 mg orally twice daily for 7 days, then  125 mg orally once daily for 7 days, then              125 mg orally every 2 or 3 days for 2 weeks  If this does not help she needs to contact us for reasssessment

## 2016-12-03 NOTE — Telephone Encounter (Signed)
Patient aware of verbal instructions, she is also continuing to take OTC probiotic. Rx sent to her preferred pharmacy. She understands to contact us if symptoms not getting better, she is aware that this will also delay her colonoscopy. Reminder still in Epic to contact patient later this month.

## 2016-12-03 NOTE — Telephone Encounter (Signed)
Patient of Dr. Myrtie Neitheranis, routed to DOD, patient tested positive for C diff on 10/12, she finished antibiotic on 11/26/16. She was symptom free until Tuesday 10/30. Started having abdominal cramping then watery diarrhea yesterday and today, no fever.

## 2016-12-18 DIAGNOSIS — H35032 Hypertensive retinopathy, left eye: Secondary | ICD-10-CM | POA: Diagnosis not present

## 2016-12-18 DIAGNOSIS — H2513 Age-related nuclear cataract, bilateral: Secondary | ICD-10-CM | POA: Diagnosis not present

## 2016-12-18 DIAGNOSIS — H2000 Unspecified acute and subacute iridocyclitis: Secondary | ICD-10-CM | POA: Diagnosis not present

## 2017-01-04 ENCOUNTER — Encounter: Payer: Federal, State, Local not specified - PPO | Admitting: Family Medicine

## 2017-01-12 ENCOUNTER — Encounter: Payer: Federal, State, Local not specified - PPO | Admitting: Gastroenterology

## 2017-01-19 ENCOUNTER — Encounter: Payer: Federal, State, Local not specified - PPO | Admitting: Family Medicine

## 2017-03-01 ENCOUNTER — Ambulatory Visit (AMBULATORY_SURGERY_CENTER): Payer: Self-pay

## 2017-03-01 ENCOUNTER — Other Ambulatory Visit: Payer: Self-pay

## 2017-03-01 VITALS — Ht 62.0 in | Wt 213.6 lb

## 2017-03-01 DIAGNOSIS — Z1211 Encounter for screening for malignant neoplasm of colon: Secondary | ICD-10-CM

## 2017-03-01 MED ORDER — NA SULFATE-K SULFATE-MG SULF 17.5-3.13-1.6 GM/177ML PO SOLN: 1.0000 | Freq: Once | ORAL | 0 refills | Status: AC

## 2017-03-01 NOTE — Progress Notes (Signed)
Denies allergies to eggs or soy products. Denies complication of anesthesia or sedation. Denies use of weight loss medication. Denies use of O2.   Emmi instructions declined.    Patient states that she has taken Phentermine in the past but has not taken it in the past year. Patient still has some of this medication at home. Patient was instructed to not start taking it again until after her procedure. Patient verbalizes understanding the importance of not taking Phentermine 10 days prior to procedure.

## 2017-03-03 ENCOUNTER — Encounter: Payer: Self-pay | Admitting: Gastroenterology

## 2017-03-15 ENCOUNTER — Ambulatory Visit (AMBULATORY_SURGERY_CENTER): Payer: Federal, State, Local not specified - PPO | Admitting: Gastroenterology

## 2017-03-15 ENCOUNTER — Encounter: Payer: Self-pay | Admitting: Gastroenterology

## 2017-03-15 VITALS — BP 150/75 | HR 77 | Temp 96.8°F | Resp 16 | Ht 62.0 in | Wt 213.0 lb

## 2017-03-15 DIAGNOSIS — Z1211 Encounter for screening for malignant neoplasm of colon: Secondary | ICD-10-CM

## 2017-03-15 DIAGNOSIS — Z1212 Encounter for screening for malignant neoplasm of rectum: Secondary | ICD-10-CM

## 2017-03-15 MED ORDER — SODIUM CHLORIDE 0.9 % IV SOLN: 500.0000 mL | Freq: Once | INTRAVENOUS | Status: DC

## 2017-03-15 NOTE — Progress Notes (Signed)
Pt's states no medical or surgical changes since previsit or office visit. 

## 2017-03-15 NOTE — Progress Notes (Signed)
To PACU, VSS. Report to RN.tb 

## 2017-03-15 NOTE — Op Note (Signed)
[facility] Patient Name: Cheyenne Hancock Procedure Date: 03/15/2017 1:17 PM MRN: [id number] Endoscopist: Sherilyn Cooter L. Myrtie Neither , MD Age: 57 Referring MD:  Date of Birth: May 23, 1960 Gender: Female Account #: [id number] Procedure:                Colonoscopy Indications:              Screening for colorectal malignant neoplasm, This                            is the patient's first colonoscopy Medicines:                Monitored Anesthesia Care Procedure:                Pre-Anesthesia Assessment:                           - Prior to the procedure, a History and Physical                            was performed, and patient medications and                            allergies were reviewed. The patient's tolerance of                            previous anesthesia was also reviewed. The risks                            and benefits of the procedure and the sedation                            options and risks were discussed with the patient.                            All questions were answered, and informed consent                            was obtained. Prior Anticoagulants: The patient has                            taken no previous anticoagulant or antiplatelet                            agents. ASA Grade Assessment: II - A patient with                            mild systemic disease. After reviewing the risks                            and benefits, the patient was deemed in                            satisfactory condition to undergo the procedure.  After obtaining informed consent, the colonoscope                            was passed under direct vision. Throughout the                            procedure, the patient's blood pressure, pulse, and                            oxygen saturations were monitored continuously. The                            Colonoscope was introduced through the anus and                            advanced to the the  cecum, identified by                            appendiceal orifice and ileocecal valve. The                            colonoscopy was performed without difficulty. The                            patient tolerated the procedure well. The quality                            of the bowel preparation was excellent. The                            ileocecal valve, appendiceal orifice, and rectum                            were photographed. The bowel preparation used was                            SUPREP. Scope In: 1:24:35 PM Scope Out: 1:40:32 PM Scope Withdrawal Time: 0 hours 10 minutes 0 seconds  Total Procedure Duration: 0 hours 15 minutes 57 seconds  Findings:                 The perianal and digital rectal examinations were                            normal.                           The entire examined colon appeared normal.                           Retroflexion in the rectum was not performed due to                            anatomy. Complications:            No immediate complications. Estimated Blood Loss:  Estimated blood loss: none. Impression:               - The entire examined colon is normal.                           - No specimens collected. Recommendation:           - Patient has a contact number available for                            emergencies. The signs and symptoms of potential                            delayed complications were discussed with the                            patient. Return to normal activities tomorrow.                            Written discharge instructions were provided to the                            patient.                           - Resume previous diet.                           - Continue present medications.                           - Repeat colonoscopy in 10 years for screening                            purposes.  L. Myrtie Neither, MD 03/15/2017 1:46:18 PM This report has been signed electronically.

## 2017-03-15 NOTE — Patient Instructions (Signed)
YOU HAD AN ENDOSCOPIC PROCEDURE TODAY AT THE [facility]:   Refer to the procedure report that was given to you for any specific questions about what was found during the examination.  If the procedure report does not answer your questions, please call your gastroenterologist to clarify.  If you requested that your care partner not be given the details of your procedure findings, then the procedure report has been included in a sealed envelope for you to review at your convenience later.  YOU SHOULD EXPECT: Some feelings of bloating in the abdomen. Passage of more gas than usual.  Walking can help get rid of the air that was put into your GI tract during the procedure and reduce the bloating. If you had a lower endoscopy (such as a colonoscopy or flexible sigmoidoscopy) you may notice spotting of blood in your stool or on the toilet paper. If you underwent a bowel prep for your procedure, you may not have a normal bowel movement for a few days.  Please Note:  You might notice some irritation and congestion in your nose or some drainage.  This is from the oxygen used during your procedure.  There is no need for concern and it should clear up in a day or so.  SYMPTOMS TO REPORT IMMEDIATELY:   Following lower endoscopy (colonoscopy or flexible sigmoidoscopy):  Excessive amounts of blood in the stool  Significant tenderness or worsening of abdominal pains  Swelling of the abdomen that is new, acute  Fever of 100F or higher  For urgent or emergent issues, a gastroenterologist can be reached at any hour by calling [phone].   DIET:  We do recommend a small meal at first, but then you may proceed to your regular diet.  Drink plenty of fluids but you should avoid alcoholic beverages for 24 hours.  MEDICATIONS:  Continue present medications.  ACTIVITY:  You should plan to take it easy for the rest of today and you should NOT DRIVE or use heavy machinery until tomorrow (because of the  sedation medicines used during the test).    FOLLOW UP: Our staff will call the number listed on your records the next business day following your procedure to check on you and address any questions or concerns that you may have regarding the information given to you following your procedure. If we do not reach you, we will leave a message.  However, if you are feeling well and you are not experiencing any problems, there is no need to return our call.  We will assume that you have returned to your regular daily activities without incident.  If any biopsies were taken you will be contacted by phone or by letter within the next 1-3 weeks.  Please call us at [phone] if you have not heard about the biopsies in 3 weeks.   Thank you for allowing us to provide for your healthcare needs today.   SIGNATURES/CONFIDENTIALITY: You and/or your care partner have signed paperwork which will be entered into your electronic medical record.  These signatures attest to the fact that that the information above on your After Visit Summary has been reviewed and is understood.  Full responsibility of the confidentiality of this discharge information lies with you and/or your care-partner. 

## 2017-03-16 ENCOUNTER — Telehealth: Payer: Self-pay

## 2017-03-16 ENCOUNTER — Telehealth: Payer: Self-pay | Admitting: *Deleted

## 2017-03-16 NOTE — Telephone Encounter (Signed)
Called [phone] and left a messaged we tried to reach pt for a follow up call. maw

## 2017-03-16 NOTE — Telephone Encounter (Signed)
Message left

## 2017-03-18 ENCOUNTER — Telehealth: Payer: Self-pay | Admitting: Gastroenterology

## 2017-03-18 NOTE — Telephone Encounter (Signed)
Spoke to patient, she has not had a bowel movement since 2/11 but has not had much of an appetite. Encouraged her to start eating more and try miralax to help get her bowels moving. Instructed to call back if she continues to have problems. Let her know that it sometimes takes awhile to start back having normal bm's.

## 2017-03-31 ENCOUNTER — Encounter: Payer: Federal, State, Local not specified - PPO | Admitting: Family Medicine

## 2017-04-06 ENCOUNTER — Emergency Department (HOSPITAL_COMMUNITY)
Admission: EM | Admit: 2017-04-06 | Discharge: 2017-04-06 | Disposition: A | Discharge status: Home / Self Care | Payer: Federal, State, Local not specified - PPO | Attending: Emergency Medicine | Admitting: Emergency Medicine

## 2017-04-06 ENCOUNTER — Encounter (HOSPITAL_COMMUNITY): Payer: Self-pay | Admitting: Emergency Medicine

## 2017-04-06 ENCOUNTER — Other Ambulatory Visit: Payer: Self-pay

## 2017-04-06 DIAGNOSIS — Z79899 Other long term (current) drug therapy: Secondary | ICD-10-CM | POA: Diagnosis not present

## 2017-04-06 DIAGNOSIS — N3000 Acute cystitis without hematuria: Secondary | ICD-10-CM

## 2017-04-06 DIAGNOSIS — I10 Essential (primary) hypertension: Secondary | ICD-10-CM | POA: Insufficient documentation

## 2017-04-06 DIAGNOSIS — N390 Urinary tract infection, site not specified: Secondary | ICD-10-CM | POA: Insufficient documentation

## 2017-04-06 DIAGNOSIS — R300 Dysuria: Secondary | ICD-10-CM | POA: Diagnosis not present

## 2017-04-06 LAB — URINALYSIS, ROUTINE W REFLEX MICROSCOPIC
Bacteria, UA: NONE SEEN
Bilirubin Urine: NEGATIVE
Glucose, UA: NEGATIVE mg/dL
Ketones, ur: NEGATIVE mg/dL
Nitrite: POSITIVE — AB
Protein, ur: 100 mg/dL — AB
Specific Gravity, Urine: 1.014 (ref 1.005–1.030)
Squamous Epithelial / LPF: NONE SEEN
pH: 7 (ref 5.0–8.0)

## 2017-04-06 MED ORDER — OXYCODONE-ACETAMINOPHEN 5-325 MG PO TABS
1.0000 | ORAL_TABLET | Freq: Once | ORAL | Status: AC
  Administered 2017-04-06: 1 via ORAL
  Filled 2017-04-06: qty 1

## 2017-04-06 MED ORDER — CIPROFLOXACIN HCL 500 MG PO TABS: 500.0000 mg | ORAL_TABLET | Freq: Two times a day (BID) | ORAL | 0 refills | Status: DC

## 2017-04-06 NOTE — Discharge Instructions (Signed)
Cipro as prescribed.  Return to the emergency department if you develop severe abdominal pain, high fevers, or other new and concerning symptoms.

## 2017-04-06 NOTE — ED Triage Notes (Signed)
Pt c/o dysuria, frequency, retention, low back pain. Denies fever, denies n/v.

## 2017-04-06 NOTE — ED Provider Notes (Signed)
[facility]   CSN: [id number] Arrival date & time: 04/06/17  0449     History   Chief Complaint Chief Complaint  Patient presents with  . Urinary Frequency    HPI Cheyenne Hancock is a 57 y.o. female.  Patient is a 57 year old female presenting with a 2-day history of dysuria, low back pain, and urinary frequency.  She has a history of prior UTIs and this feels similar.  She denies any fevers, chills, vomiting, or significant abdominal pain.   The history is provided by the patient.  Urinary Frequency  This is a new problem. The current episode started 2 days ago. The problem occurs constantly. The problem has been gradually worsening. Pertinent negatives include no abdominal pain. Exacerbated by: Urinating. Nothing relieves the symptoms. Treatments tried: Azo. The treatment provided no relief.    Past Medical History:  Diagnosis Date  . Chicken pox   . Hypertension    Dx 2009-2010  . Kidney stones   . Osteopenia    DEXA 11/22/08  . Varicose veins of bilateral lower extremities with pain     Patient Active Problem List   Diagnosis Date Noted  . Hypertension, essential, benign 10/06/2016  . Class 2 obesity with body mass index (BMI) of 37.0 to 37.9 in adult 10/06/2016    Past Surgical History:  Procedure Laterality Date  . TUBAL LIGATION  11/24/1984    OB History    No data available       Home Medications    Prior to Admission medications   Medication Sig Start Date End Date Taking? Authorizing Provider  Biotin 4098110000 MCG TABS Take 1 tablet by mouth daily.    [provider]  dicyclomine (BENTYL) 10 MG capsule Take 1 capsule (10 mg total) by mouth 3 (three) times daily before meals. 10/06/16 11/05/16  [place], Betty G, MD    Family History Family History  Problem Relation Age of Onset  . Arthritis Mother   . Stroke Mother   . Hypertension Mother   . Cancer Sister        breast  .  Breast cancer Sister   . Cancer Paternal Uncle        lung and prostate  . Breast cancer Maternal Grandmother   . Stomach cancer Maternal Aunt   . Colon cancer Neg Hx   . Esophageal cancer Neg Hx   . Liver cancer Neg Hx   . Pancreatic cancer Neg Hx   . Rectal cancer Neg Hx     Social History Social History   Tobacco Use  . Smoking status: Never Smoker  . Smokeless tobacco: Never Used  Substance Use Topics  . Alcohol use: No  . Drug use: No     Allergies   Patient has no known allergies.   Review of Systems Review of Systems  Gastrointestinal: Negative for abdominal pain.  Genitourinary: Positive for frequency.  All other systems reviewed and are negative.    Physical Exam Updated Vital Signs BP (!) 164/71 (BP Location: Right Arm)   Pulse 83   Temp 98.1 F (36.7 C) (Oral)   Resp 16   Ht 5\' 2"  (1.575 m)   Wt 93 kg (205 lb)   SpO2 99%   BMI 37.49 kg/m   Physical Exam  Constitutional: She is oriented to person, place, and time. She appears well-developed and well-nourished. No distress.  HENT:  Head: Normocephalic and atraumatic.  Neck: Normal range of  motion. Neck supple.  Cardiovascular: Normal rate and regular rhythm. Exam reveals no gallop and no friction rub.  No murmur heard. Pulmonary/Chest: Effort normal and breath sounds normal. No respiratory distress. She has no wheezes.  Abdominal: Soft. Bowel sounds are normal. She exhibits no distension. There is no tenderness.  Musculoskeletal: Normal range of motion.  Neurological: She is alert and oriented to person, place, and time.  Skin: Skin is warm and dry. She is not diaphoretic.  Nursing note and vitals reviewed.    ED Treatments / Results  Labs (all labs ordered are listed, but only abnormal results are displayed) Labs Reviewed  URINALYSIS, ROUTINE W REFLEX MICROSCOPIC    EKG  EKG Interpretation None       Radiology No results found.  Procedures Procedures (including critical care  time)  Medications Ordered in ED Medications - No data to display   Initial Impression / Assessment and Plan / ED Course  I have reviewed the triage vital signs and the nursing notes.  Pertinent labs & imaging results that were available during my care of the patient were reviewed by me and considered in my medical decision making (see chart for details).  Patient presents with dysuria, frequency, and low back pain.  Urinalysis is consistent with a UTI.  She will be treated with Cipro and follow-up as needed.  Final Clinical Impressions(s) / ED Diagnoses   Final diagnoses:  None    ED Discharge Orders    None       Geoffery Lyons, MD 04/06/17 [phone]

## 2017-04-13 NOTE — Progress Notes (Signed)
HPI:   [name] is a 57 y.o. female, who is here today for her routine physical.  Last CPE: 2010 I saw her last in 10/2016. She was evaluated recently in the ER due to acute cystitis, 04/06/2017.  Regular exercise 3 or more time per week: Not consistently. Following a healthy diet: Not consistently. She lives alone.  Chronic medical problems: Hypertension, borderline diabetes, obesity, and uveitis.  Lab Results  Component Value Date   HGBA1C 5.6 10/06/2016    Pap smear 8 years ago. Hx of abnormal pap smears: Denies Hx of STD's Denies.  M: 9 LMP 38 G: 2 L:2  There is no immunization history on file for this patient.  Mammogram: 10/2016 Colonoscopy: 03/2017. DEXA: 11/2008 Osteopenia.   Hep C screening: She does not recall having it done.  She has some concerns today.  Dx with UTI recently, completed Cipro and still having "tingling" sensation. Frequency has resolved. She has not noted vaginal discharge or bleeding. No vaginal pruritus.   A month of numbness on lateral aspect of RLE, burning/numbness. Intermittently. Alleviated by movement and rubbing leg.  Exacerbated with prolonged sitting or lying down.  Hx of lower back pain, intermittent, no radiated, 6/10, and no worse than usual.  Moles on face itching. She would like a referral to dermatologists.  She has had these lesions for about 9 years.   Review of Systems  Constitutional: Negative for appetite change, fatigue and fever.  HENT: Negative for dental problem, hearing loss, nosebleeds, sore throat, trouble swallowing and voice change.   Eyes: Negative for redness and visual disturbance.  Respiratory: Negative for cough, shortness of breath and wheezing.   Cardiovascular: Negative for chest pain and leg swelling.  Gastrointestinal: Negative for abdominal pain, blood in stool, nausea and vomiting.       No changes in bowel habits.  Endocrine: Negative for cold intolerance,  heat intolerance, polydipsia, polyphagia and polyuria.  Genitourinary: Positive for dysuria. Negative for decreased urine volume, hematuria, vaginal bleeding and vaginal discharge.       No breast tenderness or nipple discharge.  Musculoskeletal: Positive for back pain. Negative for gait problem.  Skin: Negative for rash and wound.  Neurological: Positive for numbness. Negative for syncope, weakness and headaches.  Hematological: Negative for adenopathy. Does not bruise/bleed easily.  Psychiatric/Behavioral: Negative for confusion and sleep disturbance. The patient is not nervous/anxious.   All other systems reviewed and are negative.     Current Outpatient Medications on File Prior to Visit  Medication Sig Dispense Refill  . ciprofloxacin (CIPRO) 500 MG tablet Take 1 tablet (500 mg total) by mouth 2 (two) times daily. One po bid x 7 days (Patient not taking: Reported on 04/14/2017) 14 tablet 0  . Phenazopyridine HCl (AZO URINARY PAIN PO) Take 1 tablet by mouth 4 (four) times daily as needed (pain).     Current Facility-Administered Medications on File Prior to Visit  Medication Dose Route Frequency Provider Last Rate Last Dose  . 0.9 %  sodium chloride infusion  500 mL Intravenous Once Sherrilyn Ristanis, Henry L III, MD         Past Medical History:  Diagnosis Date  . Chicken pox   . Hypertension    Dx 2009-2010  . Kidney stones   . Osteopenia    DEXA 11/22/08  . Varicose veins of bilateral lower extremities with pain     Past Surgical History:  Procedure Laterality Date  . TUBAL LIGATION  11/24/1984  No Known Allergies  Family History  Problem Relation Age of Onset  . Arthritis Mother   . Stroke Mother   . Hypertension Mother   . Cancer Sister        breast  . Breast cancer Sister   . Cancer Paternal Uncle        lung and prostate  . Breast cancer Maternal Grandmother   . Stomach cancer Maternal Aunt   . Colon cancer Neg Hx   . Esophageal cancer Neg Hx   . Liver cancer  Neg Hx   . Pancreatic cancer Neg Hx   . Rectal cancer Neg Hx     Social History   Socioeconomic History  . Marital status: Divorced    Spouse name: None  . Number of children: 2  . Years of education: None  . Highest education level: None  Social Needs  . Financial resource strain: None  . Food insecurity - worry: None  . Food insecurity - inability: None  . Transportation needs - medical: None  . Transportation needs - non-medical: None  Occupational History  . None  Tobacco Use  . Smoking status: Never Smoker  . Smokeless tobacco: Never Used  Substance and Sexual Activity  . Alcohol use: No  . Drug use: No  . Sexual activity: None  Other Topics Concern  . None  Social History Narrative  . None     Vitals:   04/14/17 0901  BP: 123/74  Pulse: 75  Resp: 12  Temp: 98.2 F (36.8 C)  SpO2: 98%   Body mass index is 37.91 kg/m.   Wt Readings from Last 3 Encounters:  04/14/17 207 lb 4 oz (94 kg)  04/06/17 205 lb (93 kg)  03/15/17 213 lb (96.6 kg)      Physical Exam  Nursing note and vitals reviewed. Constitutional: She is oriented to person, place, and time. She appears well-developed. No distress.  HENT:  Head: Normocephalic and atraumatic.  Right Ear: Hearing, tympanic membrane, external ear and ear canal normal.  Left Ear: Hearing, tympanic membrane, external ear and ear canal normal.  Mouth/Throat: Uvula is midline, oropharynx is clear and moist and mucous membranes are normal.  Eyes: Conjunctivae and EOM are normal. Pupils are equal, round, and reactive to light.  Neck: No tracheal deviation present. No thyromegaly present.  Cardiovascular: Normal rate and regular rhythm.  No murmur heard. Pulses:      Dorsalis pedis pulses are 2+ on the right side, and 2+ on the left side.  Tortuous varicose veins on LE,bilateral.  Respiratory: Effort normal and breath sounds normal. No respiratory distress.  GI: Soft. She exhibits no mass. There is no  hepatomegaly. There is no tenderness.  Genitourinary: There is no rash, tenderness or lesion on the right labia. There is no rash, tenderness or lesion on the left labia. Cervix exhibits no motion tenderness, no discharge and no friability. Right adnexum displays no mass, no tenderness and no fullness. Left adnexum displays no mass, no tenderness and no fullness. No erythema or bleeding in the vagina. No vaginal discharge found.  Genitourinary Comments: Breast: No masses, skin abnormalities, or nipple discharge appreciated bilaterally. Pap smear collected.    Musculoskeletal: She exhibits no edema or tenderness.       Thoracic back: She exhibits no tenderness and no bony tenderness.       Lumbar back: She exhibits no tenderness and no bony tenderness.  No major deformity or signs of synovitis appreciated.  Lymphadenopathy:  She has no cervical adenopathy.    She has no axillary adenopathy.       Right: No inguinal and no supraclavicular adenopathy present.       Left: No inguinal and no supraclavicular adenopathy present.  Neurological: She is alert and oriented to person, place, and time. She has normal strength. No cranial nerve deficit. Coordination and gait normal.  Reflex Scores:      Bicep reflexes are 2+ on the right side and 2+ on the left side.      Patellar reflexes are 2+ on the right side and 2+ on the left side. Skin: Skin is warm. No rash noted. No erythema.  Psychiatric: She has a normal mood and affect. Her speech is normal.  Well groomed, good eye contact.     ASSESSMENT AND PLAN:  Cheyenne Hancock was here today annual physical examination.   Orders Placed This Encounter  Procedures  . Culture, Urine  . DEXAScan  . Tdap vaccine greater than or equal to 7yo IM  . Vitamin B12  . CBC  . TSH  . Basic metabolic panel  . Lipid panel  . Hepatitis C antibody screen  . Ambulatory referral to Dermatology  . POCT Urinalysis Dipstick (Automated)  . NCV  with EMG(electromyography)   Lab Results  Component Value Date   VITAMINB12 260 04/15/2017   Lab Results  Component Value Date   CREATININE 0.84 04/15/2017   BUN 8 04/15/2017   NA 140 04/15/2017   K 4.1 04/15/2017   CL 105 04/15/2017   CO2 27 04/15/2017   Lab Results  Component Value Date   TSH 5.42 (H) 04/15/2017   Lab Results  Component Value Date   WBC 6.6 04/15/2017   HGB 11.3 (L) 04/15/2017   HCT 33.3 (L) 04/15/2017   MCV 88.3 04/15/2017   PLT 269.0 04/15/2017    Routine general medical examination at a health care facility  We discussed the importance of regular physical activity and healthy diet for prevention of chronic illness and/or complications. Preventive guidelines reviewed. Vaccination updated.  Ca++ and vit D supplementation recommended. Next CPE in a year.  The 10-year ASCVD risk score Denman George DC Montez Hageman., et al., 2013) is: 3.9%   Values used to calculate the score:     Age: 57 years     Sex: Female     Is Non-Hispanic African American: Yes     Diabetic: No     Tobacco smoker: No     Systolic Blood Pressure: 123 mmHg     Is BP treated: No     HDL Cholesterol: 52.5 mg/dL     Total Cholesterol: 195 mg/dL   Screening for lipid disorders -     Lipid panel; Future  Hypertension, essential, benign  Adequately controlled. No changes in current management. DASH diet recommended. Eye exam recommended annually. F/U in 6 months, before if needed.  -     Basic metabolic panel; Future   Dysuria  Urinary symptoms resolved,she reports a "tingling" sensation upon urination. Ucx was done done at the time she was Dx with UTI. Ucx sent today and further recommendations will be given accordingly.  -     POCT Urinalysis Dipstick (Automated) -     Culture, Urine  Numbness in right leg  Possible etiologies discussed: ? Neuropathy,radiculopathy,vein disease among some. Further recommendations will be given according to labs/EMG results.  -     Vitamin  B12; Future -     CBC;  Future -     TSH; Future -     NCV with EMG(electromyography); Future -     TSH -     CBC -     Vitamin B12  Change in pigmented skin lesion of face -     Ambulatory referral to Dermatology  Asymptomatic postmenopausal estrogen deficiency -     DEXAScan; Future  Encounter for HCV screening test for high risk patient -     Hepatitis C antibody screen; Future   Cervical cancer screening -     Cytology - PAP ([place])  Varicose veins of both lower extremities, unspecified whether complicated  This problem could also cause LE burning/numbness discomfort. Compression stocking recommended. We could consider vein specialist referral,she prefers to hold on this for now.  Need for Tdap vaccination -     Tdap vaccine greater than or equal to 7yo IM      No problem-specific Assessment & Plan notes found for this encounter.    Return in 6 months (on 10/15/2017) for HTN. Labs tomorrow..          Joren Rehm G. [place], MD  [facility]. [place] office.

## 2017-04-14 ENCOUNTER — Encounter: Payer: Self-pay | Admitting: Family Medicine

## 2017-04-14 ENCOUNTER — Ambulatory Visit (INDEPENDENT_AMBULATORY_CARE_PROVIDER_SITE_OTHER): Payer: Federal, State, Local not specified - PPO | Admitting: Family Medicine

## 2017-04-14 ENCOUNTER — Other Ambulatory Visit (HOSPITAL_COMMUNITY)
Admission: RE | Admit: 2017-04-14 | Discharge: 2017-04-14 | Disposition: A | Discharge status: Home / Self Care | Payer: Federal, State, Local not specified - PPO | Source: Ambulatory Visit | Attending: Family Medicine | Admitting: Family Medicine

## 2017-04-14 VITALS — BP 123/74 | HR 75 | Temp 98.2°F | Resp 12 | Ht 62.0 in | Wt 207.2 lb

## 2017-04-14 DIAGNOSIS — Z13220 Encounter for screening for lipoid disorders: Secondary | ICD-10-CM | POA: Diagnosis not present

## 2017-04-14 DIAGNOSIS — I10 Essential (primary) hypertension: Secondary | ICD-10-CM

## 2017-04-14 DIAGNOSIS — L819 Disorder of pigmentation, unspecified: Secondary | ICD-10-CM | POA: Diagnosis not present

## 2017-04-14 DIAGNOSIS — R200 Anesthesia of skin: Secondary | ICD-10-CM

## 2017-04-14 DIAGNOSIS — I8393 Asymptomatic varicose veins of bilateral lower extremities: Secondary | ICD-10-CM | POA: Diagnosis not present

## 2017-04-14 DIAGNOSIS — Z780 Asymptomatic menopausal state: Secondary | ICD-10-CM | POA: Diagnosis not present

## 2017-04-14 DIAGNOSIS — R87810 Cervical high risk human papillomavirus (HPV) DNA test positive: Secondary | ICD-10-CM | POA: Diagnosis not present

## 2017-04-14 DIAGNOSIS — Z23 Encounter for immunization: Secondary | ICD-10-CM

## 2017-04-14 DIAGNOSIS — Z124 Encounter for screening for malignant neoplasm of cervix: Secondary | ICD-10-CM | POA: Insufficient documentation

## 2017-04-14 DIAGNOSIS — Z0000 Encounter for general adult medical examination without abnormal findings: Secondary | ICD-10-CM | POA: Diagnosis not present

## 2017-04-14 DIAGNOSIS — Z9189 Other specified personal risk factors, not elsewhere classified: Secondary | ICD-10-CM

## 2017-04-14 DIAGNOSIS — Z1159 Encounter for screening for other viral diseases: Secondary | ICD-10-CM

## 2017-04-14 DIAGNOSIS — R300 Dysuria: Secondary | ICD-10-CM

## 2017-04-14 LAB — POC URINALSYSI DIPSTICK (AUTOMATED)
Bilirubin, UA: NEGATIVE
Glucose, UA: NEGATIVE
Nitrite, UA: NEGATIVE
Spec Grav, UA: >=1.03 — AB (ref 1.010–1.025)
Urobilinogen, UA: 0.2 E.U./dL
pH, UA: 5.5 (ref 5.0–8.0)

## 2017-04-14 NOTE — Patient Instructions (Addendum)
A few things to remember from today's visit:   Routine general medical examination at a health care facility  Screening for lipid disorders - Plan: Lipid panel  Hypertension, essential, benign - Plan: Basic metabolic panel  Dysuria - Plan: POCT Urinalysis Dipstick (Automated), Culture, Urine  Numbness in right leg - Plan: Vitamin B12, CBC, TSH, NCV with EMG(electromyography)  Change in pigmented skin lesion of face - Plan: Ambulatory referral to Dermatology  Asymptomatic postmenopausal estrogen deficiency - Plan: DEXAScan  Encounter for HCV screening test for high risk patient - Plan: Hepatitis C antibody screen  Cervical cancer screening - Plan: Cytology - PAP ([place])  Today you have you routine preventive visit.  At least 150 minutes of moderate exercise per week, daily brisk walking for 15-30 min is a good exercise option. Healthy diet low in saturated (animal) fats and sweets and consisting of fresh fruits and vegetables, lean meats such as fish and white chicken and whole grains.  These are some of recommendations for screening depending of age and risk factors:   - Vaccines:  Tdap vaccine every 10 years.  Shingles vaccine recommended at age 27, could be given after 59 years of age but not sure about insurance coverage.   Pneumonia vaccines:  Prevnar 13 at 65 and Pneumovax at 66. Sometimes Pneumovax is giving earlier if history of smoking, lung disease,diabetes,kidney disease among some.    Screening for diabetes at age 47 and every 3 years.  Cervical cancer prevention:  Pap smear starts at 59 years of age and continues periodically until 59 years old in low risk women. Pap smear every 3 years between 36 and 15 years old. Pap smear every 3-5 years between women 30 and older if pap smear negative and HPV screening negative.   -Breast cancer: Mammogram: There is disagreement between experts about when to start screening in low risk asymptomatic female but recent  recommendations are to start screening at 14 and not later than 59 years old , every 1-2 years and after 59 yo q 2 years. Screening is recommended until 59 years old but some women can continue screening depending of healthy issues.   Colon cancer screening: starts at 59 years old until 59 years old.  Cholesterol disorder screening at age 78 and every 3 years.  Also recommended:  1. Dental visit- Brush and floss your teeth twice daily; visit your dentist twice a year. 2. Eye doctor- Get an eye exam at least every 2 years. 3. Helmet use- Always wear a helmet when riding a bicycle, motorcycle, rollerblading or skateboarding. 4. Safe sex- If you may be exposed to sexually transmitted infections, use a condom. 5. Seat belts- Seat belts can save your live; always wear one. 6. Smoke/Carbon Monoxide detectors- These detectors need to be installed on the appropriate level of your home. Replace batteries at least once a year. 7. Skin cancer- When out in the sun please cover up and use sunscreen 15 SPF or higher. 8. Violence- If anyone is threatening or hurting you, please tell your healthcare provider.  9. Drink alcohol in moderation- Limit alcohol intake to one drink or less per day. Never drink and drive.  Please be sure medication list is accurate. If a new problem present, please set up appointment sooner than planned today.        Varicose Veins Varicose veins are veins that have become enlarged and twisted. They are usually seen in the legs but can occur in other parts of the body as  well. What are the causes? This condition is the result of valves in the veins not working properly. Valves in the veins help to return blood from the leg to the heart. If these valves are damaged, blood flows backward and backs up into the veins in the leg near the skin. This causes the veins to become larger. What increases the risk? People who are on their feet a lot, who are pregnant, or who are  overweight are more likely to develop varicose veins. What are the signs or symptoms?  Bulging, twisted-appearing, bluish veins, most commonly found on the legs.  Leg pain or a feeling of heaviness. These symptoms may be worse at the end of the day.  Leg swelling.  Changes in skin color. How is this diagnosed? A health care provider can usually diagnose varicose veins by examining your legs. Your health care provider may also recommend an ultrasound of your leg veins. How is this treated? Most varicose veins can be treated at home.However, other treatments are available for people who have persistent symptoms or want to improve the cosmetic appearance of the varicose veins. These treatment options include:  Sclerotherapy. A solution is injected into the vein to close it off.  Laser treatment. A laser is used to heat the vein to close it off.  Radiofrequency vein ablation. An electrical current produced by radio waves is used to close off the vein.  Phlebectomy. The vein is surgically removed through small incisions made over the varicose vein.  Vein ligation and stripping. The vein is surgically removed through incisions made over the varicose vein after the vein has been tied (ligated).  Follow these instructions at home:  Do not stand or sit in one position for long periods of time. Do not sit with your legs crossed. Rest with your legs raised during the day.  Wear compression stockings as directed by your health care provider. These stockings help to prevent blood clots and reduce swelling in your legs.  Do not wear other tight, encircling garments around your legs, pelvis, or waist.  Walk as much as possible to increase blood flow.  Raise the foot of your bed at night with 2-inch blocks.  If you get a cut in the skin over the vein and the vein bleeds, lie down with your leg raised and press on it with a clean cloth until the bleeding stops. Then place a bandage (dressing) on  the cut. See your health care provider if it continues to bleed. Contact a health care provider if:  The skin around your ankle starts to break down.  You have pain, redness, tenderness, or hard swelling in your leg over a vein.  You are uncomfortable because of leg pain. This information is not intended to replace advice given to you by your health care provider. Make sure you discuss any questions you have with your health care provider. Document Released: 10/29/2004 Document Revised: 06/27/2015 Document Reviewed: 07/23/2015 Elsevier Interactive Patient Education  2017 [employer]

## 2017-04-15 DIAGNOSIS — Z1159 Encounter for screening for other viral diseases: Secondary | ICD-10-CM | POA: Diagnosis not present

## 2017-04-15 DIAGNOSIS — Z9189 Other specified personal risk factors, not elsewhere classified: Secondary | ICD-10-CM | POA: Diagnosis not present

## 2017-04-15 LAB — CBC
HCT: 33.3 % — ABNORMAL LOW (ref 36.0–46.0)
Hemoglobin: 11.3 g/dL — ABNORMAL LOW (ref 12.0–15.0)
MCHC: 33.8 g/dL (ref 30.0–36.0)
MCV: 88.3 fl (ref 78.0–100.0)
Platelets: 269 10*3/uL (ref 150.0–400.0)
RBC: 3.77 Mil/uL — ABNORMAL LOW (ref 3.87–5.11)
RDW: 14.5 % (ref 11.5–15.5)
WBC: 6.6 10*3/uL (ref 4.0–10.5)

## 2017-04-15 LAB — BASIC METABOLIC PANEL
BUN: 8 mg/dL (ref 6–23)
CO2: 27 mEq/L (ref 19–32)
Calcium: 9.8 mg/dL (ref 8.4–10.5)
Chloride: 105 mEq/L (ref 96–112)
Creatinine, Ser: 0.84 mg/dL (ref 0.40–1.20)
GFR: 89.32 mL/min (ref >60.00)
Glucose, Bld: 100 mg/dL — ABNORMAL HIGH (ref 70–99)
Potassium: 4.1 mEq/L (ref 3.5–5.1)
Sodium: 140 mEq/L (ref 135–145)

## 2017-04-15 LAB — LIPID PANEL
Cholesterol: 195 mg/dL (ref 0–200)
HDL: 52.5 mg/dL (ref >39.00)
LDL Cholesterol: 122 mg/dL — ABNORMAL HIGH (ref 0–99)
NonHDL: 142.73
Total CHOL/HDL Ratio: 4
Triglycerides: 103 mg/dL (ref 0.0–149.0)
VLDL: 20.6 mg/dL (ref 0.0–40.0)

## 2017-04-15 LAB — URINE CULTURE
MICRO NUMBER:: 90319707
Result:: NO GROWTH
SPECIMEN QUALITY:: ADEQUATE

## 2017-04-15 LAB — TSH: TSH: 5.42 u[IU]/mL — ABNORMAL HIGH (ref 0.35–4.50)

## 2017-04-15 LAB — VITAMIN B12: Vitamin B-12: 260 pg/mL (ref 211–911)

## 2017-04-16 LAB — CYTOLOGY - PAP
Diagnosis: NEGATIVE
HPV: DETECTED — AB

## 2017-04-16 LAB — HEPATITIS C ANTIBODY
Hepatitis C Ab: NONREACTIVE
SIGNAL TO CUT-OFF: 0.6 (ref <1.00)

## 2017-04-18 ENCOUNTER — Encounter: Payer: Self-pay | Admitting: Family Medicine

## 2017-04-26 ENCOUNTER — Other Ambulatory Visit: Payer: Federal, State, Local not specified - PPO

## 2017-05-05 ENCOUNTER — Other Ambulatory Visit: Payer: Self-pay

## 2017-05-05 ENCOUNTER — Emergency Department (HOSPITAL_COMMUNITY): Payer: Federal, State, Local not specified - PPO

## 2017-05-05 ENCOUNTER — Emergency Department (HOSPITAL_COMMUNITY)
Admission: EM | Admit: 2017-05-05 | Discharge: 2017-05-05 | Disposition: A | Discharge status: Home / Self Care | Payer: Federal, State, Local not specified - PPO | Attending: Emergency Medicine | Admitting: Emergency Medicine

## 2017-05-05 ENCOUNTER — Encounter (HOSPITAL_COMMUNITY): Payer: Self-pay | Admitting: Emergency Medicine

## 2017-05-05 DIAGNOSIS — M1712 Unilateral primary osteoarthritis, left knee: Secondary | ICD-10-CM

## 2017-05-05 DIAGNOSIS — I10 Essential (primary) hypertension: Secondary | ICD-10-CM | POA: Insufficient documentation

## 2017-05-05 DIAGNOSIS — M25562 Pain in left knee: Secondary | ICD-10-CM | POA: Diagnosis not present

## 2017-05-05 MED ORDER — OXYCODONE-ACETAMINOPHEN 5-325 MG PO TABS
1.0000 | ORAL_TABLET | Freq: Once | ORAL | Status: AC
  Administered 2017-05-05: 1 via ORAL
  Filled 2017-05-05: qty 1

## 2017-05-05 MED ORDER — NAPROXEN 500 MG PO TABS: 500.0000 mg | ORAL_TABLET | Freq: Two times a day (BID) | ORAL | 0 refills | Status: DC

## 2017-05-05 NOTE — ED Provider Notes (Signed)
[facility] EMERGENCY DEPARTMENT Provider Note   CSN: [id number] Arrival date & time: 05/05/17  1191     History   Chief Complaint Chief Complaint  Patient presents with  . Knee Pain    HPI Cheyenne Hancock is a 57 y.o. female with a past medical history of osteopenia, who presents to ED for evaluation of 24-hour history of left knee pain that is worse with movement.  She knelt down on her left knee to close a filing cabinet drawer at work and when she tried to stand back up, she was having difficulty due to pain in her left knee.  She took 1 dose of ibuprofen 800 mg with no improvement in her symptoms.  She states that she is ambulatory but has to keep her knee stiff to prevent worsening of her pain.  No previous history of similar symptoms in the past.  Denies any changes in sensation, history of gout, other injuries or falls, fever, prior fracture, dislocations or procedures in the area, recent surgeries, recent prolonged travel, hormone use, prior DVT, recent immobilization.  HPI  Past Medical History:  Diagnosis Date  . Chicken pox   . Hypertension    Dx 2009-2010  . Kidney stones   . Osteopenia    DEXA 11/22/08  . Varicose veins of bilateral lower extremities with pain     Patient Active Problem List   Diagnosis Date Noted  . Varicose veins of both lower extremities 04/14/2017  . Hypertension, essential, benign 10/06/2016  . Class 2 obesity with body mass index (BMI) of 37.0 to 37.9 in adult 10/06/2016    Past Surgical History:  Procedure Laterality Date  . TUBAL LIGATION  11/24/1984     OB History   None      Home Medications    Prior to Admission medications   Medication Sig Start Date End Date Taking? Authorizing Provider  ciprofloxacin (CIPRO) 500 MG tablet Take 1 tablet (500 mg total) by mouth 2 (two) times daily. One po bid x 7 days Patient not taking: Reported on 04/14/2017 04/06/17   Geoffery Lyons, MD  naproxen (NAPROSYN)  500 MG tablet Take 1 tablet (500 mg total) by mouth 2 (two) times daily. 05/05/17   Derion Kreiter, PA-C  Phenazopyridine HCl (AZO URINARY PAIN PO) Take 1 tablet by mouth 4 (four) times daily as needed (pain).    [provider]    Family History Family History  Problem Relation Age of Onset  . Arthritis Mother   . Stroke Mother   . Hypertension Mother   . Cancer Sister        breast  . Breast cancer Sister   . Cancer Paternal Uncle        lung and prostate  . Breast cancer Maternal Grandmother   . Stomach cancer Maternal Aunt   . Colon cancer Neg Hx   . Esophageal cancer Neg Hx   . Liver cancer Neg Hx   . Pancreatic cancer Neg Hx   . Rectal cancer Neg Hx     Social History Social History   Tobacco Use  . Smoking status: Never Smoker  . Smokeless tobacco: Never Used  Substance Use Topics  . Alcohol use: No  . Drug use: No     Allergies   Patient has no known allergies.   Review of Systems Review of Systems  Constitutional: Negative for chills and fever.  Gastrointestinal: Negative for nausea and vomiting.  Musculoskeletal: Positive for arthralgias  and gait problem. Negative for back pain, joint swelling, myalgias, neck pain and neck stiffness.  Skin: Negative for color change and rash.  Neurological: Negative for weakness and numbness.     Physical Exam Updated Vital Signs BP 124/63   Pulse 75   Temp 97.9 F (36.6 C) (Oral)   Resp 16   Ht 5\' 2"  (1.575 m)   Wt 93 kg (205 lb)   SpO2 100%   BMI 37.49 kg/m   Physical Exam  Constitutional: She appears well-developed and well-nourished. No distress.  HENT:  Head: Normocephalic and atraumatic.  Eyes: Conjunctivae and EOM are normal. No scleral icterus.  Neck: Normal range of motion.  Pulmonary/Chest: Effort normal. No respiratory distress.  Musculoskeletal: She exhibits tenderness. She exhibits no edema or deformity.  Tenderness to palpation of the anterior and medial left patella.  Able to perform  straight leg raise.  Difficulty with flexion of the left knee 2/2 pain but improved after pain medication. Sensation intact to light touch of BLE. No visible deformity. No overlying erythema, edema or warmth of joint noted. 2+ DP pulse noted. No calf tenderness or popliteal swelling.  Neurological: She is alert.  Skin: No rash noted. She is not diaphoretic.  Psychiatric: She has a normal mood and affect.  Nursing note and vitals reviewed.    ED Treatments / Results  Labs (all labs ordered are listed, but only abnormal results are displayed) Labs Reviewed - No data to display  EKG None  Radiology Dg Knee Complete 4 Views Left  Result Date: 05/05/2017 CLINICAL DATA:  Pain EXAM: LEFT KNEE - COMPLETE 4+ VIEW COMPARISON:  None. FINDINGS: Frontal, lateral, and bilateral oblique views were obtained. There is no fracture or dislocation. No joint effusion. There are phleboliths anterior to the patella. There is severe narrowing of the patellofemoral joint with spurring along the posterior patella. Other joint spaces appear unremarkable. IMPRESSION: Extensive osteoarthritic change in the patellofemoral joint. Small phleboliths anterior to the patella. No fracture or joint effusion. Electronically Signed   By: Bretta BangWilliam  Woodruff III M.D.   On: 05/05/2017 07:01    Procedures Procedures (including critical care time)  Medications Ordered in ED Medications  oxyCODONE-acetaminophen (PERCOCET/ROXICET) 5-325 MG per tablet 1 tablet (1 tablet Oral Given 05/05/17 0723)     Initial Impression / Assessment and Plan / ED Course  I have reviewed the triage vital signs and the nursing notes.  Pertinent labs & imaging results that were available during my care of the patient were reviewed by me and considered in my medical decision making (see chart for details).     Patient presents to ED for evaluation of 24-hour history of left knee pain that is worse with movement.  Symptoms began after she knelt down on  her left knee to close a filing cabinet drawer at work.  She had pain when trying to stand up.  She does have a history of osteopenia.  No improvement with 1 dose of ibuprofen.  On physical exam there is no lower extremity edema, erythema or calf tenderness noted.  There is no warmth or erythema of the joint.  Initial flexion of the left knee was limited secondary to pain.  X-ray shows evidence of extensive osteoarthritis in the area with no effusion present.  Normal sensation and pulses noted on examination.  After pain medication was administered, she has had improvement in her range of motion of the knee although still reports pain.  I have low suspicion for DVT  being the cause of her symptoms based on her absence of risk factors including no recent surgeries, recent prolonged travel or immobilization, hormone use, prior DVT.  I suspect that her symptoms are due to her arthritis rather than infectious or vascular cause.  Will give patient anti-inflammatories, advised rice therapy and advised her to follow-up with orthopedics for further evaluation if symptoms persist.  Patient appears stable for discharge at this time.  Strict return precautions given.  Portions of this note were generated with Scientist, clinical (histocompatibility and immunogenetics). Dictation errors may occur despite best attempts at proofreading.   Final Clinical Impressions(s) / ED Diagnoses   Final diagnoses:  Osteoarthritis of left knee, unspecified osteoarthritis type    ED Discharge Orders        Ordered    naproxen (NAPROSYN) 500 MG tablet  2 times daily     05/05/17 0754       Dietrich Pates, PA-C 05/05/17 0758    Glynn Octave, MD 05/05/17 [id number]

## 2017-05-05 NOTE — ED Notes (Signed)
ED Provider at bedside. 

## 2017-05-05 NOTE — ED Triage Notes (Addendum)
Pt reports onset of L knee pain, weakness beginning Tue AM. States she knelt down on her L knee to shut a drawer at work and had trouble getting up. Used ice and 800mg  ibuprofen, without improvement. Denies any trauma or previous injury. Pt ambulatory from lobby.

## 2017-05-07 ENCOUNTER — Ambulatory Visit: Payer: Federal, State, Local not specified - PPO | Admitting: Family Medicine

## 2017-05-07 DIAGNOSIS — Z0289 Encounter for other administrative examinations: Secondary | ICD-10-CM

## 2017-05-09 NOTE — Progress Notes (Signed)
HPI:  Chief Complaint  Patient presents with  . Follow-up    ed follow-up, left knee pain    [name] is a 57 y.o. female, who is here today to follow on recent ER visit.  She was seen in the ER on 05/05/17 for left knee pain. Left knee X ray: Extensive osteoarthritic change in the patellofemoral joint. Small phleboliths anterior to the patella. No fracture or joint effusion.   She started with left knee pain after kneeling down to close filing cabinet drawer, felt pain when she was standing back up. Sudden onset, limitation of ROM, and edema. No erythema.  Pain is exacerbated by movement, prolonged standing,and working. Alleviated by rest.  She is on Ibuprofen 800 mg tid and local ice. She did not fill Rx for Naproxen prescribed in the ER. Pain has improved, now 5/10,sharp/achy pain. ROM has also improved.  Hx of OA: knees, cervical,lower back, and IP pain.  She has followed with ortho in the past due to right knee pain, she completed PT.  She needs FMLA completed.  She is also reporting that she has lost about 10 pounds. She stopped eating bread, increased vegetable intake, and she is not longer eating fried food.  Review of Systems  Constitutional: Negative for chills, fatigue and fever.  Respiratory: Negative for shortness of breath and wheezing.   Cardiovascular: Negative for chest pain and leg swelling.  Gastrointestinal: Negative for abdominal pain, nausea and vomiting.  Musculoskeletal: Positive for arthralgias, back pain, joint swelling and neck pain.  Skin: Negative for rash and wound.  Neurological: Negative for weakness, numbness and headaches.      Current Outpatient Medications on File Prior to Visit  Medication Sig Dispense Refill  . ciprofloxacin (CIPRO) 500 MG tablet Take 1 tablet (500 mg total) by mouth 2 (two) times daily. One po bid x 7 days (Patient not taking: Reported on 04/14/2017) 14 tablet 0  . naproxen  (NAPROSYN) 500 MG tablet Take 1 tablet (500 mg total) by mouth 2 (two) times daily. (Patient not taking: Reported on 05/10/2017) 30 tablet 0  . Phenazopyridine HCl (AZO URINARY PAIN PO) Take 1 tablet by mouth 4 (four) times daily as needed (pain).     Current Facility-Administered Medications on File Prior to Visit  Medication Dose Route Frequency Provider Last Rate Last Dose  . 0.9 %  sodium chloride infusion  500 mL Intravenous Once Sherrilyn Ristanis, Henry L III, MD         Past Medical History:  Diagnosis Date  . Chicken pox   . Hypertension    Dx 2009-2010  . Kidney stones   . Osteopenia    DEXA 11/22/08  . Varicose veins of bilateral lower extremities with pain    No Known Allergies  Social History   Socioeconomic History  . Marital status: Divorced    Spouse name: Not on file  . Number of children: 2  . Years of education: Not on file  . Highest education level: Not on file  Occupational History  . Not on file  Social Needs  . Financial resource strain: Not on file  . Food insecurity:    Worry: Not on file    Inability: Not on file  . Transportation needs:    Medical: Not on file    Non-medical: Not on file  Tobacco Use  . Smoking status: Never Smoker  . Smokeless tobacco: Never Used  Substance and Sexual Activity  .  Alcohol use: No  . Drug use: No  . Sexual activity: Not on file  Lifestyle  . Physical activity:    Days per week: Not on file    Minutes per session: Not on file  . Stress: Not on file  Relationships  . Social connections:    Talks on phone: Not on file    Gets together: Not on file    Attends religious service: Not on file    Active member of club or organization: Not on file    Attends meetings of clubs or organizations: Not on file    Relationship status: Not on file  Other Topics Concern  . Not on file  Social History Narrative  . Not on file    Vitals:   05/10/17 0918  BP: 124/70  Pulse: 96  Resp: 12  Temp: 98.9 F (37.2 C)  SpO2:  98%   Body mass index is 36.03 kg/m.  Wt Readings from Last 3 Encounters:  05/10/17 197 lb (89.4 kg)  05/05/17 205 lb (93 kg)  04/14/17 207 lb 4 oz (94 kg)     Physical Exam  Nursing note and vitals reviewed. Constitutional: She is oriented to person, place, and time. She appears well-developed. She does not appear ill. No distress.  HENT:  Head: Normocephalic and atraumatic.  Eyes: Conjunctivae are normal.  Cardiovascular: Normal rate and regular rhythm.  No murmur heard. Pulses:      Dorsalis pedis pulses are 2+ on the right side, and 2+ on the left side.  Varicose veins LE,R>L  Respiratory: Effort normal and breath sounds normal. No respiratory distress.  Musculoskeletal: She exhibits no edema.       Right knee: She exhibits normal range of motion and no effusion. Tenderness found. Medial joint line tenderness noted.  No signs of synovitis. Left knee: on inspection no effusion, erythema, or deformities.  Pain upon palpation of medial interarticular line. Pain is not elicited with movement, not limited. Valgus and varus stress normal, anterior and posterior drawer test negative. Patellar apprehension test negative.  Neurological: She is alert and oriented to person, place, and time. She has normal strength. Gait normal.  Skin: Skin is warm. No rash noted. No erythema.  Psychiatric: She has a normal mood and affect. Her speech is normal.  Well groomed, good eye contact.      ASSESSMENT AND PLAN:   Ms. Cheyenne Hancock was seen today for follow-up.  Diagnoses and all orders for this visit:   Generalized osteoarthritis of multiple sites Continue ibuprofen 800 mg 3 times daily as needed, educated about side effects. Cervical ROM recommended. Topical icy hot or similar products can also be used. Follow-up as needed.  Class 2 obesity with body mass index (BMI) of 37.0 to 37.9 in adult Congratulated for weight loss. Encouraged to continue healthy diet and regular physical  activity. We discussed adverse effects of obesity as well as benefits of weight loss.   Osteoarthritis of left knee Improving. She has done PT for right knee OA in the past, states that she knows the exercises and has the handouts.So not interested in PT evaluation, she is planning on starting left knee PT exercises. Instructed to let me know if symptoms get worse, in which case orthopedist evaluation will be recommended. Follow-up as needed.    FMLA will be completed: 05/04/17 to 05/12/17.   -[name] was advised to seek immediate medical attention if sudden worsening symptoms or to follow if they persist or if  new concerns arise.       Srinivas Lippman G. Martin[place], MD  [facility]. [place] office.

## 2017-05-10 ENCOUNTER — Encounter: Payer: Self-pay | Admitting: *Deleted

## 2017-05-10 ENCOUNTER — Encounter: Payer: Self-pay | Admitting: Family Medicine

## 2017-05-10 ENCOUNTER — Ambulatory Visit (INDEPENDENT_AMBULATORY_CARE_PROVIDER_SITE_OTHER): Payer: Federal, State, Local not specified - PPO | Admitting: Family Medicine

## 2017-05-10 VITALS — BP 124/70 | HR 96 | Temp 98.9°F | Resp 12 | Ht 62.0 in | Wt 197.0 lb

## 2017-05-10 DIAGNOSIS — Z6837 Body mass index (BMI) 37.0-37.9, adult: Secondary | ICD-10-CM | POA: Diagnosis not present

## 2017-05-10 DIAGNOSIS — M1712 Unilateral primary osteoarthritis, left knee: Secondary | ICD-10-CM | POA: Diagnosis not present

## 2017-05-10 DIAGNOSIS — M159 Polyosteoarthritis, unspecified: Secondary | ICD-10-CM | POA: Insufficient documentation

## 2017-05-10 DIAGNOSIS — E6609 Other obesity due to excess calories: Secondary | ICD-10-CM | POA: Diagnosis not present

## 2017-05-10 NOTE — Assessment & Plan Note (Signed)
Congratulated for weight loss. Encouraged to continue healthy diet and regular physical activity. We discussed adverse effects of obesity as well as benefits of weight loss.

## 2017-05-10 NOTE — Assessment & Plan Note (Addendum)
Improving. She has done PT for right knee OA in the past, states that she knows the exercises and has the handouts.So not interested in PT evaluation, she is planning on starting left knee PT exercises. Instructed to let me know if symptoms get worse, in which case orthopedist evaluation will be recommended. Follow-up as needed.

## 2017-05-10 NOTE — Assessment & Plan Note (Signed)
Continue ibuprofen 800 mg 3 times daily as needed, educated about side effects. Cervical ROM recommended. Topical icy hot or similar products can also be used. Follow-up as needed.

## 2017-05-10 NOTE — Patient Instructions (Addendum)
A few things to remember from today's visit:   Osteoarthritis of left knee, unspecified osteoarthritis type  Generalized osteoarthritis of multiple sites  Caution advised with ibuprofen, take it with food as needed. Continue working on weight loss. Continue topical over-the-counter medication. Start PT exercises on left knee. Will call you when FMLA is completed.  Please be sure medication list is accurate. If a new problem present, please set up appointment sooner than planned today.

## 2017-05-12 ENCOUNTER — Telehealth: Payer: Self-pay | Admitting: *Deleted

## 2017-05-12 NOTE — Telephone Encounter (Signed)
Returned call to patient and informed her that paperwork isn't completed yet and once it done, I would call her for pick-up. Patient verbalized understanding.

## 2017-05-12 NOTE — Telephone Encounter (Signed)
  Copied from CRM [id number]. Topic: Inquiry >> May 12, 2017 10:53 AM Eston Mouldavis, Cheri B wrote: Reason for CRM: Pt called about FMLA paperwork she left at office on  Monday, she is asking if those papers are ready for pick up

## 2017-05-14 NOTE — Telephone Encounter (Signed)
Copy sent to be scanned. ?

## 2017-05-14 NOTE — Telephone Encounter (Signed)
Forms were completed by Dr SwazilandJordan. I called the pt and left a detailed message these were left at the front desk for pick up.

## 2017-07-22 ENCOUNTER — Emergency Department (HOSPITAL_COMMUNITY): Payer: Federal, State, Local not specified - PPO

## 2017-07-22 ENCOUNTER — Encounter (HOSPITAL_COMMUNITY): Payer: Self-pay | Admitting: Emergency Medicine

## 2017-07-22 ENCOUNTER — Emergency Department (HOSPITAL_COMMUNITY)
Admission: EM | Admit: 2017-07-22 | Discharge: 2017-07-22 | Disposition: A | Discharge status: Home / Self Care | Payer: Federal, State, Local not specified - PPO | Attending: Emergency Medicine | Admitting: Emergency Medicine

## 2017-07-22 DIAGNOSIS — Y92410 Unspecified street and highway as the place of occurrence of the external cause: Secondary | ICD-10-CM | POA: Diagnosis not present

## 2017-07-22 DIAGNOSIS — Y9389 Activity, other specified: Secondary | ICD-10-CM | POA: Insufficient documentation

## 2017-07-22 DIAGNOSIS — S199XXA Unspecified injury of neck, initial encounter: Secondary | ICD-10-CM | POA: Diagnosis not present

## 2017-07-22 DIAGNOSIS — S39012A Strain of muscle, fascia and tendon of lower back, initial encounter: Secondary | ICD-10-CM | POA: Diagnosis not present

## 2017-07-22 DIAGNOSIS — Z79899 Other long term (current) drug therapy: Secondary | ICD-10-CM | POA: Diagnosis not present

## 2017-07-22 DIAGNOSIS — I10 Essential (primary) hypertension: Secondary | ICD-10-CM | POA: Insufficient documentation

## 2017-07-22 DIAGNOSIS — M546 Pain in thoracic spine: Secondary | ICD-10-CM | POA: Diagnosis not present

## 2017-07-22 DIAGNOSIS — S161XXA Strain of muscle, fascia and tendon at neck level, initial encounter: Secondary | ICD-10-CM | POA: Insufficient documentation

## 2017-07-22 DIAGNOSIS — Y999 Unspecified external cause status: Secondary | ICD-10-CM | POA: Insufficient documentation

## 2017-07-22 DIAGNOSIS — M545 Low back pain: Secondary | ICD-10-CM | POA: Diagnosis not present

## 2017-07-22 DIAGNOSIS — S3992XA Unspecified injury of lower back, initial encounter: Secondary | ICD-10-CM | POA: Diagnosis not present

## 2017-07-22 DIAGNOSIS — S0990XA Unspecified injury of head, initial encounter: Secondary | ICD-10-CM | POA: Diagnosis not present

## 2017-07-22 DIAGNOSIS — R200 Anesthesia of skin: Secondary | ICD-10-CM | POA: Diagnosis not present

## 2017-07-22 MED ORDER — CYCLOBENZAPRINE HCL 10 MG PO TABS: 10.0000 mg | ORAL_TABLET | Freq: Three times a day (TID) | ORAL | 0 refills | Status: DC | PRN

## 2017-07-22 MED ORDER — ONDANSETRON HCL 4 MG PO TABS
4.0000 mg | ORAL_TABLET | Freq: Once | ORAL | Status: AC
  Administered 2017-07-22: 4 mg via ORAL
  Filled 2017-07-22: qty 1

## 2017-07-22 MED ORDER — CYCLOBENZAPRINE HCL 10 MG PO TABS
10.0000 mg | ORAL_TABLET | Freq: Once | ORAL | Status: AC
  Administered 2017-07-22: 10 mg via ORAL
  Filled 2017-07-22: qty 1

## 2017-07-22 MED ORDER — HYDROCODONE-ACETAMINOPHEN 5-325 MG PO TABS: ORAL_TABLET | ORAL | 0 refills | Status: DC

## 2017-07-22 MED ORDER — HYDROCODONE-ACETAMINOPHEN 5-325 MG PO TABS
2.0000 | ORAL_TABLET | Freq: Once | ORAL | Status: AC
  Administered 2017-07-22: 2 via ORAL
  Filled 2017-07-22: qty 2
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
## 2017-07-26 ENCOUNTER — Encounter: Payer: Self-pay | Admitting: Family Medicine

## 2017-07-26 ENCOUNTER — Ambulatory Visit: Payer: Federal, State, Local not specified - PPO | Admitting: Family Medicine

## 2017-07-26 ENCOUNTER — Ambulatory Visit (INDEPENDENT_AMBULATORY_CARE_PROVIDER_SITE_OTHER): Payer: Federal, State, Local not specified - PPO | Admitting: Family Medicine

## 2017-07-26 VITALS — BP 118/58 | HR 88 | Temp 98.5°F | Resp 12 | Ht 62.0 in | Wt 201.6 lb

## 2017-07-26 DIAGNOSIS — M549 Dorsalgia, unspecified: Secondary | ICD-10-CM

## 2017-07-26 DIAGNOSIS — I10 Essential (primary) hypertension: Secondary | ICD-10-CM | POA: Diagnosis not present

## 2017-07-26 DIAGNOSIS — M545 Low back pain, unspecified: Secondary | ICD-10-CM

## 2017-07-26 MED ORDER — CYCLOBENZAPRINE HCL 10 MG PO TABS: 10.0000 mg | ORAL_TABLET | Freq: Three times a day (TID) | ORAL | 0 refills | Status: AC | PRN

## 2017-07-26 MED ORDER — NAPROXEN 500 MG PO TABS: 500.0000 mg | ORAL_TABLET | Freq: Two times a day (BID) | ORAL | 0 refills | Status: AC
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
## 2017-07-26 NOTE — Patient Instructions (Addendum)
A few things to remember from today's visit:   Hypertension, essential, benign  Acute upper back pain  Acute bilateral low back pain without sciatica  For now he will continue with naproxen and Flexeril. Monitor her at home. As far as the pain is progressively getting better I do not think there is something to be concerned about.  If pain gets worse or you start with numbness or tingling please follow-up as soon as possible.  Please be sure medication list is accurate. If a new problem present, please set up appointment sooner than planned today.

## 2017-12-06 ENCOUNTER — Encounter: Payer: Self-pay | Admitting: Family Medicine

## 2017-12-06 ENCOUNTER — Ambulatory Visit: Payer: Federal, State, Local not specified - PPO | Admitting: Family Medicine

## 2017-12-06 VITALS — BP 120/76 | HR 65 | Temp 97.7°F | Resp 12 | Ht 62.0 in | Wt 197.4 lb

## 2017-12-06 DIAGNOSIS — R296 Repeated falls: Secondary | ICD-10-CM

## 2017-12-06 DIAGNOSIS — R7989 Other specified abnormal findings of blood chemistry: Secondary | ICD-10-CM | POA: Diagnosis not present

## 2017-12-06 DIAGNOSIS — R200 Anesthesia of skin: Secondary | ICD-10-CM

## 2017-12-06 DIAGNOSIS — M159 Polyosteoarthritis, unspecified: Secondary | ICD-10-CM | POA: Diagnosis not present

## 2017-12-06 DIAGNOSIS — D649 Anemia, unspecified: Secondary | ICD-10-CM

## 2017-12-06 DIAGNOSIS — R202 Paresthesia of skin: Secondary | ICD-10-CM

## 2017-12-06 NOTE — Patient Instructions (Signed)
A few things to remember from today's visit:   Frequent falls  Numbness and tingling of right lower extremity - Plan: CBC, Vitamin B12, Ambulatory referral to Neurology  Abnormal TSH - Plan: TSH, T4, free  Anemia, unspecified type - Plan: Ferritin   Fall Prevention in the Home Falls can cause injuries. They can happen to people of all ages. There are many things you can do to make your home safe and to help prevent falls. What can I do on the outside of my home?  Regularly fix the edges of walkways and driveways and fix any cracks.  Remove anything that might make you trip as you walk through a door, such as a raised step or threshold.  Trim any bushes or trees on the path to your home.  Use bright outdoor lighting.  Clear any walking paths of anything that might make someone trip, such as rocks or tools.  Regularly check to see if handrails are loose or broken. Make sure that both sides of any steps have handrails.  Any raised decks and porches should have guardrails on the edges.  Have any leaves, snow, or ice cleared regularly.  Use sand or salt on walking paths during winter.  Clean up any spills in your garage right away. This includes oil or grease spills. What can I do in the bathroom?  Use night lights.  Install grab bars by the toilet and in the tub and shower. Do not use towel bars as grab bars.  Use non-skid mats or decals in the tub or shower.  If you need to sit down in the shower, use a plastic, non-slip stool.  Keep the floor dry. Clean up any water that spills on the floor as soon as it happens.  Remove soap buildup in the tub or shower regularly.  Attach bath mats securely with double-sided non-slip rug tape.  Do not have throw rugs and other things on the floor that can make you trip. What can I do in the bedroom?  Use night lights.  Make sure that you have a light by your bed that is easy to reach.  Do not use any sheets or blankets that  are too big for your bed. They should not hang down onto the floor.  Have a firm chair that has side arms. You can use this for support while you get dressed.  Do not have throw rugs and other things on the floor that can make you trip. What can I do in the kitchen?  Clean up any spills right away.  Avoid walking on wet floors.  Keep items that you use a lot in easy-to-reach places.  If you need to reach something above you, use a strong step stool that has a grab bar.  Keep electrical cords out of the way.  Do not use floor polish or wax that makes floors slippery. If you must use wax, use non-skid floor wax.  Do not have throw rugs and other things on the floor that can make you trip. What can I do with my stairs?  Do not leave any items on the stairs.  Make sure that there are handrails on both sides of the stairs and use them. Fix handrails that are broken or loose. Make sure that handrails are as long as the stairways.  Check any carpeting to make sure that it is firmly attached to the stairs. Fix any carpet that is loose or worn.  Avoid having throw rugs  at the top or bottom of the stairs. If you do have throw rugs, attach them to the floor with carpet tape.  Make sure that you have a light switch at the top of the stairs and the bottom of the stairs. If you do not have them, ask someone to add them for you. What else can I do to help prevent falls?  Wear shoes that: ? Do not have high heels. ? Have rubber bottoms. ? Are comfortable and fit you well. ? Are closed at the toe. Do not wear sandals.  If you use a stepladder: ? Make sure that it is fully opened. Do not climb a closed stepladder. ? Make sure that both sides of the stepladder are locked into place. ? Ask someone to hold it for you, if possible.  Clearly mark and make sure that you can see: ? Any grab bars or handrails. ? First and last steps. ? Where the edge of each step is.  Use tools that help you  move around (mobility aids) if they are needed. These include: ? Canes. ? Walkers. ? Scooters. ? Crutches.  Turn on the lights when you go into a dark area. Replace any light bulbs as soon as they burn out.  Set up your furniture so you have a clear path. Avoid moving your furniture around.  If any of your floors are uneven, fix them.  If there are any pets around you, be aware of where they are.  Review your medicines with your doctor. Some medicines can make you feel dizzy. This can increase your chance of falling. Ask your doctor what other things that you can do to help prevent falls. This information is not intended to replace advice given to you by your health care provider. Make sure you discuss any questions you have with your health care provider. Document Released: 11/15/2008 Document Revised: 06/27/2015 Document Reviewed: 02/23/2014 Elsevier Interactive Patient Education  [employer].  Please be sure medication list is accurate. If a new problem present, please set up appointment sooner than planned today.
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
## 2017-12-07 LAB — CBC
HCT: 36 % (ref 36.0–46.0)
Hemoglobin: 12 g/dL (ref 12.0–15.0)
MCHC: 33.4 g/dL (ref 30.0–36.0)
MCV: 89.8 fl (ref 78.0–100.0)
Platelets: 317 10*3/uL (ref 150.0–400.0)
RBC: 4.01 Mil/uL (ref 3.87–5.11)
RDW: 14.3 % (ref 11.5–15.5)
WBC: 7.1 10*3/uL (ref 4.0–10.5)

## 2017-12-07 LAB — VITAMIN B12: Vitamin B-12: 261 pg/mL (ref 211–911)

## 2017-12-07 LAB — T4, FREE: Free T4: 0.82 ng/dL (ref 0.60–1.60)

## 2017-12-07 LAB — TSH: TSH: 1.43 u[IU]/mL (ref 0.35–4.50)

## 2017-12-07 LAB — FERRITIN: Ferritin: 56.3 ng/mL (ref 10.0–291.0)

## 2017-12-10 ENCOUNTER — Encounter: Payer: Self-pay | Admitting: Neurology
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
## 2018-02-03 ENCOUNTER — Encounter

## 2018-02-03 ENCOUNTER — Ambulatory Visit: Payer: Federal, State, Local not specified - PPO | Admitting: Neurology

## 2018-04-11 ENCOUNTER — Encounter: Payer: Self-pay | Admitting: Family Medicine

## 2018-04-11 ENCOUNTER — Ambulatory Visit: Payer: Federal, State, Local not specified - PPO | Admitting: Family Medicine

## 2018-04-11 VITALS — BP 124/82 | HR 86 | Temp 98.5°F | Resp 12 | Ht 62.0 in | Wt 200.4 lb

## 2018-04-11 DIAGNOSIS — M545 Low back pain, unspecified: Secondary | ICD-10-CM

## 2018-04-11 DIAGNOSIS — R1030 Lower abdominal pain, unspecified: Secondary | ICD-10-CM

## 2018-04-11 DIAGNOSIS — J029 Acute pharyngitis, unspecified: Secondary | ICD-10-CM | POA: Diagnosis not present

## 2018-04-11 LAB — POCT RAPID STREP A (OFFICE): Rapid Strep A Screen: NEGATIVE

## 2018-04-11 MED ORDER — CYCLOBENZAPRINE HCL 10 MG PO TABS: 10.0000 mg | ORAL_TABLET | Freq: Every day | ORAL | 0 refills | Status: AC

## 2018-04-11 NOTE — Progress Notes (Signed)
ACUTE VISIT  HPI:  Chief Complaint  Patient presents with  . Sore Throat    x 3 days with swollen tonsils    Cheyenne Hancock is a 58 y.o.female here today complaining of 3 days of respiratory symptoms.  Sore throat and enlarged tonsils, she denies history of hypertrophic tonsils. Negative for stridor or dysphasia. She states that 4 days before symptoms started she got with from the rain, she thinks this is the reason she is having symptoms. Nasal congestion, rhinorrhea, and postnasal drainage. Occasionally coughs to try to bring postnasal drainage up, she sees mucus mixed with blood when coughing hard.  She has not noted gum/nose bleeding.  Sore Throat   This is a new problem. The current episode started in the past 7 days. The problem has been unchanged. There has been no fever. The pain is moderate. Associated symptoms include abdominal pain and congestion. Pertinent negatives include no coughing, diarrhea, ear pain, headaches, shortness of breath, trouble swallowing or vomiting. She has had no exposure to strep or mono. She has tried gargles for the symptoms. The treatment provided moderate relief.   No Hx of recent travel or visitors from overseas. No sick contact. No known insect bite.  Hx of allergies: Denies.  OTC medications for this problem: Throat lozenges.   Lower back pain that started a month ago. She states that she has "little" history of lower back pain but not like this one. She feels like a "catch" mid lower back that last a few seconds, 10/10, no radiated.  After this pain resolves she has lower back soreness for a few hours. She has pain sometimes during the day when she bends forward but also at night when she is in bed.  She has not identified exacerbating or alleviating factors. Denies lower extremity numbness or tingling, saddle anesthesia. No history of recent trauma. She has not taking OTC analgesic.  Today she is also  complaining about a month of suprapubic/lower abdominal pain, intermittently, 6/10 "soreness."  This does not happen at the same time lower back pain, she is not sure if they are both related. She reports this problem is new.  She has not identified exacerbating or alleviating factors. No changes in bowel habits, she has a bowel movement every 2 to 3 days. Denies fever, chills, nausea, or vomiting. She has not tried OTC medication.   Review of Systems  Constitutional: Positive for fatigue. Negative for activity change, appetite change and fever.  HENT: Positive for congestion, postnasal drip, rhinorrhea and sore throat. Negative for ear pain, mouth sores, sinus pressure, trouble swallowing and voice change.   Eyes: Negative for discharge and redness.  Respiratory: Negative for cough, shortness of breath and wheezing.   Gastrointestinal: Positive for abdominal pain. Negative for blood in stool, diarrhea, nausea and vomiting.  Genitourinary: Negative for decreased urine volume, dysuria and hematuria.  Musculoskeletal: Positive for back pain. Negative for gait problem.  Skin: Negative for rash.  Allergic/Immunologic: Negative for environmental allergies.  Neurological: Negative for weakness and headaches.  Hematological: Negative for adenopathy. Does not bruise/bleed easily.    No current outpatient medications on file prior to visit.   Current Facility-Administered Medications on File Prior to Visit  Medication Dose Route Frequency Provider Last Rate Last Dose  . 0.9 %  sodium chloride infusion  500 mL Intravenous Once Sherrilyn Rist, MD         Past Medical History:  Diagnosis Date  .  Chicken pox   . Hypertension    Dx 2009-2010  . Kidney stones   . Osteopenia    DEXA 11/22/08  . Varicose veins of bilateral lower extremities with pain    No Known Allergies  Social History   Socioeconomic History  . Marital status: Divorced    Spouse name: Not on file  . Number of  children: 2  . Years of education: Not on file  . Highest education level: Not on file  Occupational History  . Not on file  Social Needs  . Financial resource strain: Not on file  . Food insecurity:    Worry: Not on file    Inability: Not on file  . Transportation needs:    Medical: Not on file    Non-medical: Not on file  Tobacco Use  . Smoking status: Never Smoker  . Smokeless tobacco: Never Used  Substance and Sexual Activity  . Alcohol use: No  . Drug use: No  . Sexual activity: Not on file  Lifestyle  . Physical activity:    Days per week: Not on file    Minutes per session: Not on file  . Stress: Not on file  Relationships  . Social connections:    Talks on phone: Not on file    Gets together: Not on file    Attends religious service: Not on file    Active member of club or organization: Not on file    Attends meetings of clubs or organizations: Not on file    Relationship status: Not on file  Other Topics Concern  . Not on file  Social History Narrative  . Not on file    Vitals:   04/11/18 1046  BP: 124/82  Pulse: 86  Resp: 12  Temp: 98.5 F (36.9 C)   Body mass index is 36.65 kg/m.  Physical Exam  Nursing note and vitals reviewed. Constitutional: She is oriented to person, place, and time. She appears well-developed. She does not appear ill. No distress.  HENT:  Head: Normocephalic and atraumatic.  Nose: Rhinorrhea present. Right sinus exhibits no maxillary sinus tenderness and no frontal sinus tenderness. Left sinus exhibits no maxillary sinus tenderness and no frontal sinus tenderness.  Mouth/Throat: Uvula is midline and mucous membranes are normal. Posterior oropharyngeal erythema present. No oropharyngeal exudate or posterior oropharyngeal edema.  Postnasal drainage. Hypertrophic tonsils with mild erythema.  Eyes: Conjunctivae are normal.  Neck: No muscular tenderness present.  Cardiovascular: Normal rate and regular rhythm.  No murmur  heard. Respiratory: Effort normal and breath sounds normal. No respiratory distress.  GI: Soft. She exhibits no mass. There is no abdominal tenderness.  Musculoskeletal:     Lumbar back: She exhibits no tenderness and no bony tenderness.  Lymphadenopathy:       Head (right side): No submandibular adenopathy present.       Head (left side): No submandibular adenopathy present.    She has no cervical adenopathy.  Neurological: She is alert and oriented to person, place, and time. She has normal strength. Gait normal.  SLR negative ,bilateral.  Skin: Skin is warm. No rash noted. No erythema.  Psychiatric: Her mood appears anxious.  Well groomed, good eye contact.    ASSESSMENT AND PLAN:  Ms.Derhonda was seen today for sore throat.  Diagnoses and all orders for this visit:  Sore throat -     POC Rapid Strep A -     Culture, Group A Strep  Pharyngitis, unspecified etiology Rapid strep  test negative. Most likely viral. Symptomatic treatment recommended. Will follow strep Cx and make recommendations accordingly.  Lower abdominal pain She has seen Dr. Myrtie Neither in 11/2016 complaining about lower abdominal pain. ? Constipation. Increase fluid and fiber intake. Abdomen examination today negative. Colonoscopy in 03/2017.  Instructed about warning signs.  -     Urinalysis, Routine w reflex microscopic  Midline low back pain without sciatica, unspecified chronicity  In 07/2017 lumbar x-ray was done because of lower back pain. History and findings today during examination do not suggest a serious problem. I do not think imaging is needed at this time. Recommend trying Flexeril 10 mg at bedtime for 15 to 20 days. Side effects of medication discussed. Follow-up as needed.  -     cyclobenzaprine (FLEXERIL) 10 MG tablet; Take 1 tablet (10 mg total) by mouth at bedtime for 20 days.    Return if symptoms worsen or fail to improve.    Betty G. Swaziland, MD  [facility]. [place] office.

## 2018-04-11 NOTE — Patient Instructions (Addendum)
A few things to remember from today's visit:   Sore throat - Plan: POC Rapid Strep A  Lower abdominal pain - Plan: Urinalysis, Routine w reflex microscopic, cyclobenzaprine (FLEXERIL) 10 MG tablet  Pharyngitis, unspecified etiology  Midline low back pain without sciatica, unspecified chronicity   Symptomatic treatment: Over the counter Acetaminophen 500 mg and/or Ibuprofen (400-600 mg) if there is not contraindications; you can alternate in between both every 4-6 hours. Gargles with saline water and throat lozenges might also help. Cold fluids.    Seek prompt medical evaluation if you are having difficulty breathing, mouth swelling, throat closing up, not able to swallow liquids (drooling), skin rash/bruising, or worsening symptoms.  Please follow up in 2 weeks if not any better.    Please be sure medication list is accurate. If a new problem present, please set up appointment sooner than planned today.

## 2018-04-13 LAB — CULTURE, GROUP A STREP
MICRO NUMBER:: 294614
SPECIMEN QUALITY:: ADEQUATE

## 2018-04-13 LAB — URINALYSIS, ROUTINE W REFLEX MICROSCOPIC
Bilirubin Urine: NEGATIVE
Hgb urine dipstick: NEGATIVE
Ketones, ur: NEGATIVE
Leukocytes,Ua: NEGATIVE
Nitrite: NEGATIVE
RBC / HPF: NONE SEEN (ref 0–?)
Specific Gravity, Urine: 1.01 (ref 1.000–1.030)
Total Protein, Urine: NEGATIVE
Urine Glucose: NEGATIVE
Urobilinogen, UA: 0.2 (ref 0.0–1.0)
WBC, UA: NONE SEEN (ref 0–?)
pH: 6 (ref 5.0–8.0)

## 2018-04-19 ENCOUNTER — Telehealth: Payer: Self-pay | Admitting: *Deleted

## 2018-04-19 NOTE — Telephone Encounter (Signed)
Patient given results on 04/18/2018 by J. Funderburke, CMA.

## 2018-04-19 NOTE — Telephone Encounter (Signed)
Copied from CRM [id number]. Topic: General - Other >> Apr 14, 2018 12:58 PM Darletta Moll L wrote: Reason for CRM: Patient requesting results from 04/11/2018

## 2018-06-16 ENCOUNTER — Telehealth: Payer: Self-pay | Admitting: Family Medicine

## 2018-06-16 NOTE — Telephone Encounter (Signed)
Copied from CRM [id number]. Topic: General - Inquiry >> Jun 16, 2018  3:39 PM Maia Petties wrote: Reason for CRM: [facility] will be contacting Dr. Swaziland for compression hose/compression socks. Pt has to get a new RX every year. They told her they would be sending a form to Dr. Swaziland. Please f/u if needed.

## 2018-06-17 NOTE — Telephone Encounter (Signed)
Faxed placed on provider's desk for completion. Will fax when completed.

## 2018-06-20 NOTE — Telephone Encounter (Signed)
Form faxed with receipt confirmation.

## 2018-09-28 ENCOUNTER — Encounter: Payer: Self-pay | Admitting: Family Medicine

## 2018-10-14 ENCOUNTER — Other Ambulatory Visit: Payer: Self-pay

## 2018-10-14 ENCOUNTER — Encounter: Payer: Self-pay | Admitting: Family Medicine

## 2018-10-14 ENCOUNTER — Ambulatory Visit: Payer: Federal, State, Local not specified - PPO | Admitting: Family Medicine

## 2018-10-14 VITALS — BP 122/80 | HR 66 | Temp 96.8°F | Resp 12 | Ht 62.0 in | Wt 217.0 lb

## 2018-10-14 DIAGNOSIS — E785 Hyperlipidemia, unspecified: Secondary | ICD-10-CM | POA: Diagnosis not present

## 2018-10-14 DIAGNOSIS — R0602 Shortness of breath: Secondary | ICD-10-CM

## 2018-10-14 DIAGNOSIS — E039 Hypothyroidism, unspecified: Secondary | ICD-10-CM

## 2018-10-14 DIAGNOSIS — Z6839 Body mass index (BMI) 39.0-39.9, adult: Secondary | ICD-10-CM

## 2018-10-14 MED ORDER — LEVOTHYROXINE SODIUM 25 MCG PO TABS: 25.0000 ug | ORAL_TABLET | Freq: Every day | ORAL | 2 refills | Status: DC

## 2018-10-14 NOTE — Progress Notes (Signed)
ACUTE VISIT   HPI:  Chief Complaint  Patient presents with  . abnormal labs    Labs ordered by wt loss provider    [name] is a 58 y.o. female, who is here today concerned about results of labs done recently. She is on obesity treatment,wt clinic,labs ordered recently and asked to see her PCP to review results. She is on Phentermine 37.5 mh 1/2 tab daily.  On 09/28/18 she had uric acid,CMP,FLP,TSH,T,and CBC. Labs in normal except for: TSH 7.02 LDH 250 TC 249,LDL 174,HDL 56,and TG 95.  Concerned about "tinning" hair and SOB when "working." She works at [employer] and has to do a lot of lifting and carrying. Denies constipation or cold/heat intolerance.  She does not have SOB when exercising or with other activities. Denies associated chest pain,palpitations,diaphoresis,abdominal pain,nausea,or vomiting. She is not on pharmacologic treatment. She is eating healthier. She does not exercise regularly but she is active through work.   Lab Results  Component Value Date   TSH 1.43 12/06/2017    Review of Systems  Constitutional: Positive for fatigue. Negative for activity change, appetite change and fever.  HENT: Negative for mouth sores, nosebleeds and trouble swallowing.   Eyes: Negative for redness and visual disturbance.  Respiratory: Negative for cough and wheezing.   Cardiovascular: Negative for leg swelling.  Gastrointestinal: Negative for abdominal pain, nausea and vomiting.       Negative for changes in bowel habits.  Genitourinary: Negative for decreased urine volume, dysuria and hematuria.  Skin: Negative for rash.  Neurological: Negative for weakness, numbness and headaches.  Rest see pertinent positives and negatives per HPI.   No current outpatient medications on file prior to visit.   Current Facility-Administered Medications on File Prior to Visit  Medication Dose Route Frequency Provider Last Rate Last Dose  . 0.9 %   sodium chloride infusion  500 mL Intravenous Once Sherrilyn Ristanis, Henry L III, MD         Past Medical History:  Diagnosis Date  . Chicken pox   . Hypertension    Dx 2009-2010  . Kidney stones   . Osteopenia    DEXA 11/22/08  . Varicose veins of bilateral lower extremities with pain    No Known Allergies  Social History   Socioeconomic History  . Marital status: Divorced    Spouse name: Not on file  . Number of children: 2  . Years of education: Not on file  . Highest education level: Not on file  Occupational History  . Not on file  Social Needs  . Financial resource strain: Not on file  . Food insecurity    Worry: Not on file    Inability: Not on file  . Transportation needs    Medical: Not on file    Non-medical: Not on file  Tobacco Use  . Smoking status: Never Smoker  . Smokeless tobacco: Never Used  Substance and Sexual Activity  . Alcohol use: No  . Drug use: No  . Sexual activity: Not on file  Lifestyle  . Physical activity    Days per week: Not on file    Minutes per session: Not on file  . Stress: Not on file  Relationships  . Social Musicianconnections    Talks on phone: Not on file    Gets together: Not on file    Attends religious service: Not on file    Active member of club or organization: Not on  file    Attends meetings of clubs or organizations: Not on file    Relationship status: Not on file  Other Topics Concern  . Not on file  Social History Narrative  . Not on file    Vitals:   10/14/18 0908  BP: 122/80  Pulse: 66  Resp: 12  Temp: (!) 96.8 F (36 C)  SpO2: 100%   Body mass index is 39.69 kg/m.   Physical Exam  Nursing note and vitals reviewed. Constitutional: She is oriented to person, place, and time. She appears well-developed. No distress.  HENT:  Head: Normocephalic and atraumatic.  Mouth/Throat: Oropharynx is clear and moist and mucous membranes are normal.  Eyes: Pupils are equal, round, and reactive to light. Conjunctivae are  normal.  Neck: No tracheal deviation present. No thyroid mass present.  Cardiovascular: Normal rate and regular rhythm.  No murmur heard. Pulses:      Dorsalis pedis pulses are 2+ on the right side and 2+ on the left side.  Respiratory: Effort normal and breath sounds normal. No respiratory distress.  GI: Soft. She exhibits no mass. There is no hepatomegaly. There is no abdominal tenderness.  Musculoskeletal:        General: No edema.  Lymphadenopathy:    She has no cervical adenopathy.  Neurological: She is alert and oriented to person, place, and time. She has normal strength. No cranial nerve deficit. Gait normal.  Skin: Skin is warm. No rash noted. No erythema.  Psychiatric: She has a normal mood and affect.  Well groomed, good eye contact.    ASSESSMENT AND PLAN:  Ms. Cheyenne Hancock was seen today for abnormal labs.  Diagnoses and all orders for this visit:  Acquired hypothyroidism Educated about Dx and treatment options. Levothyroxine 25 mcg daily. THS in 6-8 weeks.  -     levothyroxine (SYNTHROID) 25 MCG tablet; Take 1 tablet (25 mcg total) by mouth daily before breakfast. -     T4, free; Future -     TSH; Future  Hyperlipidemia, unspecified hyperlipidemia type Mildly worse when compared with FLP 04/2017. Low fat diet recommended for now. F/U in 4 months.  Shortness of breath Possible etiologies discussed. Hx does not suggest a serious process. ? Deconditioning. Instructed about warning signs.  Class 2 severe obesity due to excess calories with serious comorbidity and body mass index (BMI) of 39.0 to 39.9 in adult [facility]) We discussed benefits of wt loss as well as adverse effects of obesity. Consistency with healthy diet and physical activity recommended. Daily brisk walking for 15-30 min as tolerated.   Return in about 4 months (around 02/13/2019) for 6-8 wks labs. 4 months f/u.    -[name] was advised to seek immediate medical attention if  sudden worsening symptoms or to follow if they persist or if new concerns arise.       Cheyenne Jay G. Martinique, MD  [facility]. [place] office.

## 2018-10-14 NOTE — Patient Instructions (Addendum)
A few things to remember from today's visit:   Acquired hypothyroidism - Plan: levothyroxine (SYNTHROID) 25 MCG tablet, T4, free, TSH  Hyperlipidemia, unspecified hyperlipidemia type  Shortness of breath  Thyroid needs to be checked in 6 to 8 weeks. For now continue low-fat diet, engage in regular physical activity, and we will repeat cholesterol in 4 months. I do not think shortness of breath are related to your heart or your lungs but if it is persistent after thyroid function is normal, we may need to do further work-up.  Please be sure medication list is accurate. If a new problem present, please set up appointment sooner than planned today.

## 2018-10-20 ENCOUNTER — Telehealth: Payer: Self-pay | Admitting: Family Medicine

## 2018-10-20 NOTE — Telephone Encounter (Signed)
LMVM for the patient to contact the office to schedule a lab appointment by 11/25/2018 and a F/U in 4 months.

## 2018-10-20 NOTE — Telephone Encounter (Signed)
-----   Message from Betty G Martinique, MD sent at 10/16/2018 12:11 AM EDT ----- 4 months f/u.

## 2018-11-14 ENCOUNTER — Emergency Department (HOSPITAL_COMMUNITY)
Admission: EM | Admit: 2018-11-14 | Discharge: 2018-11-14 | Disposition: A | Discharge status: Home / Self Care | Payer: Federal, State, Local not specified - PPO | Attending: Emergency Medicine | Admitting: Emergency Medicine

## 2018-11-14 ENCOUNTER — Other Ambulatory Visit: Payer: Self-pay

## 2018-11-14 ENCOUNTER — Emergency Department (HOSPITAL_COMMUNITY): Payer: Federal, State, Local not specified - PPO

## 2018-11-14 ENCOUNTER — Emergency Department (HOSPITAL_BASED_OUTPATIENT_CLINIC_OR_DEPARTMENT_OTHER): Payer: Federal, State, Local not specified - PPO

## 2018-11-14 ENCOUNTER — Ambulatory Visit: Payer: Self-pay | Admitting: *Deleted

## 2018-11-14 DIAGNOSIS — Z20828 Contact with and (suspected) exposure to other viral communicable diseases: Secondary | ICD-10-CM | POA: Diagnosis not present

## 2018-11-14 DIAGNOSIS — M79661 Pain in right lower leg: Secondary | ICD-10-CM | POA: Diagnosis not present

## 2018-11-14 DIAGNOSIS — Z79899 Other long term (current) drug therapy: Secondary | ICD-10-CM | POA: Insufficient documentation

## 2018-11-14 DIAGNOSIS — R0789 Other chest pain: Secondary | ICD-10-CM

## 2018-11-14 DIAGNOSIS — M79609 Pain in unspecified limb: Secondary | ICD-10-CM | POA: Diagnosis not present

## 2018-11-14 DIAGNOSIS — R0602 Shortness of breath: Secondary | ICD-10-CM | POA: Diagnosis not present

## 2018-11-14 DIAGNOSIS — I10 Essential (primary) hypertension: Secondary | ICD-10-CM | POA: Diagnosis not present

## 2018-11-14 DIAGNOSIS — M79604 Pain in right leg: Secondary | ICD-10-CM

## 2018-11-14 DIAGNOSIS — J189 Pneumonia, unspecified organism: Secondary | ICD-10-CM

## 2018-11-14 DIAGNOSIS — R079 Chest pain, unspecified: Secondary | ICD-10-CM | POA: Diagnosis not present

## 2018-11-14 LAB — BASIC METABOLIC PANEL
Anion gap: 11 (ref 5–15)
BUN: 11 mg/dL (ref 6–20)
CO2: 20 mmol/L — ABNORMAL LOW (ref 22–32)
Calcium: 8.9 mg/dL (ref 8.9–10.3)
Chloride: 106 mmol/L (ref 98–111)
Creatinine, Ser: 0.84 mg/dL (ref 0.44–1.00)
GFR calc Af Amer: >60 mL/min (ref >60)
GFR calc non Af Amer: >60 mL/min (ref >60)
Glucose, Bld: 104 mg/dL — ABNORMAL HIGH (ref 70–99)
Potassium: 4.1 mmol/L (ref 3.5–5.1)
Sodium: 137 mmol/L (ref 135–145)

## 2018-11-14 LAB — CBC
HCT: 36.1 % (ref 36.0–46.0)
Hemoglobin: 11.5 g/dL — ABNORMAL LOW (ref 12.0–15.0)
MCH: 29.1 pg (ref 26.0–34.0)
MCHC: 31.9 g/dL (ref 30.0–36.0)
MCV: 91.4 fL (ref 80.0–100.0)
Platelets: 306 10*3/uL (ref 150–400)
RBC: 3.95 MIL/uL (ref 3.87–5.11)
RDW: 13.3 % (ref 11.5–15.5)
WBC: 4.4 10*3/uL (ref 4.0–10.5)
nRBC: 0 % (ref 0.0–0.2)

## 2018-11-14 LAB — TROPONIN I (HIGH SENSITIVITY)
Troponin I (High Sensitivity): 3 ng/L (ref <18)
Troponin I (High Sensitivity): 4 ng/L (ref <18)

## 2018-11-14 LAB — D-DIMER, QUANTITATIVE: D-Dimer, Quant: 1.09 ug/mL-FEU — ABNORMAL HIGH (ref 0.00–0.50)

## 2018-11-14 MED ORDER — IOHEXOL 350 MG/ML SOLN
100.0000 mL | Freq: Once | INTRAVENOUS | Status: AC | PRN
  Administered 2018-11-14: 100 mL via INTRAVENOUS

## 2018-11-14 MED ORDER — SODIUM CHLORIDE 0.9% FLUSH: 3.0000 mL | Freq: Once | INTRAVENOUS | Status: DC

## 2018-11-14 MED ORDER — AZITHROMYCIN 250 MG PO TABS: 250.0000 mg | ORAL_TABLET | Freq: Every day | ORAL | 0 refills | Status: DC

## 2018-11-14 MED ORDER — DOXYCYCLINE HYCLATE 100 MG PO CAPS: 100.0000 mg | ORAL_CAPSULE | Freq: Two times a day (BID) | ORAL | 0 refills | Status: DC

## 2018-11-14 NOTE — ED Provider Notes (Signed)
[facility] EMERGENCY DEPARTMENT Provider Note   CSN: [id number] Arrival date & time: 11/14/18  4098     History   Chief Complaint Chief Complaint  Patient presents with   Leg Pain   Chest Pain    HPI Cheyenne Hancock is a 58 y.o. female.     Patient is a 58 year old female with past medical history of hypertension, renal stones, osteoarthritis, obesity presenting to the emergency department for right lower extremity pain and shortness of breath with chest tightness.  Patient reports that last Thursday she began to feel pain and tightness in her calf extending into her knee on the right side.  The pain is worse with movement.  Reports that she also began to have intermittent chest tightness in the center of her chest.  This is also intermittent without exacerbating or relieving factors.  Last episode was in the lobby but she is currently chest pain-free.  Reports that she has been having some exertional dyspnea.  Denies any leg swelling, fever, chills, Covid exposure, nausea, vomiting.  She has not tried anything for relief.  She does feel like she has stiffness in joint pain in her right knee as well, history of osteoarthritis in the left knee.     Past Medical History:  Diagnosis Date   Chicken pox    Hypertension    Dx 2009-2010   Kidney stones    Osteopenia    DEXA 11/22/08   Varicose veins of bilateral lower extremities with pain     Patient Active Problem List   Diagnosis Date Noted   Generalized osteoarthritis of multiple sites 05/10/2017   Osteoarthritis of left knee 05/10/2017   Varicose veins of both lower extremities 04/14/2017   Hypertension, essential, benign 10/06/2016   Class 2 obesity with body mass index (BMI) of 37.0 to 37.9 in adult 10/06/2016    Past Surgical History:  Procedure Laterality Date   TUBAL LIGATION  11/24/1984     OB History   No obstetric history on file.      Home Medications     Prior to Admission medications   Medication Sig Start Date End Date Taking? Authorizing Provider  levothyroxine (SYNTHROID) 25 MCG tablet Take 1 tablet (25 mcg total) by mouth daily before breakfast. 10/14/18   [place], Betty G, MD    Family History Family History  Problem Relation Age of Onset   Arthritis Mother    Stroke Mother    Hypertension Mother    Cancer Sister        breast   Breast cancer Sister    Cancer Paternal Uncle        lung and prostate   Breast cancer Maternal Grandmother    Stomach cancer Maternal Aunt    Colon cancer Neg Hx    Esophageal cancer Neg Hx    Liver cancer Neg Hx    Pancreatic cancer Neg Hx    Rectal cancer Neg Hx     Social History Social History   Tobacco Use   Smoking status: Never Smoker   Smokeless tobacco: Never Used  Substance Use Topics   Alcohol use: No   Drug use: No     Allergies   Patient has no known allergies.   Review of Systems Review of Systems  Constitutional: Negative for appetite change, chills and fever.  HENT: Negative for congestion and sore throat.   Respiratory: Positive for chest tightness and shortness of breath. Negative for apnea, cough, wheezing and  stridor.   Cardiovascular: Negative for chest pain, palpitations and leg swelling.  Gastrointestinal: Negative for abdominal pain, nausea and vomiting.  Genitourinary: Negative for dysuria.  Musculoskeletal: Positive for arthralgias and myalgias. Negative for back pain, gait problem, joint swelling, neck pain and neck stiffness.  Skin: Negative for rash.  Neurological: Negative for dizziness, light-headedness and headaches.     Physical Exam Updated Vital Signs BP 126/69    Pulse 69    Temp 98.2 F (36.8 C) (Oral)    Resp 18    SpO2 100%   Physical Exam Vitals signs and nursing note reviewed.  Constitutional:      General: She is not in acute distress.    Appearance: Normal appearance. She is well-developed. She is obese. She is  not ill-appearing, toxic-appearing or diaphoretic.  HENT:     Head: Normocephalic and atraumatic.  Eyes:     Conjunctiva/sclera: Conjunctivae normal.     Pupils: Pupils are equal, round, and reactive to light.  Neck:     Vascular: No JVD.  Cardiovascular:     Rate and Rhythm: Normal rate and regular rhythm.     Heart sounds: Normal heart sounds.  Pulmonary:     Effort: Pulmonary effort is normal.     Breath sounds: Normal breath sounds.  Chest:     Chest wall: No mass, tenderness or edema.  Abdominal:     Palpations: Abdomen is soft.  Musculoskeletal:     Comments: Diffuse anterior tenderness to the right knee.  Tenderness to palpation of the right calf.  Normal bilateral lower extremity strength, sensation and distal pedal pulses.  Skin:    General: Skin is dry.  Neurological:     General: No focal deficit present.     Mental Status: She is alert.  Psychiatric:        Mood and Affect: Mood normal.      ED Treatments / Results  Labs (all labs ordered are listed, but only abnormal results are displayed) Labs Reviewed  BASIC METABOLIC PANEL - Abnormal; Notable for the following components:      Result Value   CO2 20 (*)    Glucose, Bld 104 (*)    All other components within normal limits  CBC - Abnormal; Notable for the following components:   Hemoglobin 11.5 (*)    All other components within normal limits  D-DIMER, QUANTITATIVE (NOT AT [facility])  TROPONIN I (HIGH SENSITIVITY)  TROPONIN I (HIGH SENSITIVITY)    EKG None  Radiology Dg Chest 2 View  Result Date: 11/14/2018 CLINICAL DATA:  Chest pain and tightness since 11/10/2018. EXAM: CHEST - 2 VIEW COMPARISON:  None. FINDINGS: Lungs clear. Heart size normal. No pneumothorax or pleural fluid. No acute or focal bony abnormality. IMPRESSION: Normal chest. Electronically Signed   By: Drusilla Kannerhomas  Dalessio M.D.   On: 11/14/2018 11:15   Vas [place] Lower Extremity Venous (dvt) (only [facility] 7a-7p)  Result Date: 11/14/2018   Lower Venous Study Indications: Pain.  Limitations: Body habitus. Comparison Study: No prior study on file for comparison Performing Technologist: Sherren Kernsandace Kanady RVS  Examination Guidelines: A complete evaluation includes B-mode imaging, spectral Doppler, color Doppler, and power Doppler as needed of all accessible portions of each vessel. Bilateral testing is considered an integral part of a complete examination. Limited examinations for reoccurring indications may be performed as noted.  +---------+---------------+---------+-----------+----------+-------------------+  RIGHT     Compressibility Phasicity Spontaneity Properties Thrombus Aging       +---------+---------------+---------+-----------+----------+-------------------+  CFV  Full            Yes       Yes                                         +---------+---------------+---------+-----------+----------+-------------------+  SFJ       Full                                                                  +---------+---------------+---------+-----------+----------+-------------------+  FV Prox   Full                                                                  +---------+---------------+---------+-----------+----------+-------------------+  FV Mid    Full                                                                  +---------+---------------+---------+-----------+----------+-------------------+  FV Distal                 Yes       Yes                    patent by color and                                                              Doppler              +---------+---------------+---------+-----------+----------+-------------------+  PFV       Full                                                                  +---------+---------------+---------+-----------+----------+-------------------+  POP       Full            Yes       Yes                                          +---------+---------------+---------+-----------+----------+-------------------+  PTV       Full                                                                  +---------+---------------+---------+-----------+----------+-------------------+  PERO      Full                                                                  +---------+---------------+---------+-----------+----------+-------------------+   +----+---------------+---------+-----------+----------+--------------+  LEFT Compressibility Phasicity Spontaneity Properties Thrombus Aging  +----+---------------+---------+-----------+----------+--------------+  CFV  Full            Yes       Yes                                    +----+---------------+---------+-----------+----------+--------------+     Summary: Right: There is no evidence of deep vein thrombosis in the lower extremity. Left: No evidence of common femoral vein obstruction.  *See table(s) above for measurements and observations.    Preliminary     Procedures Procedures (including critical care time)  Medications Ordered in ED Medications  sodium chloride flush (NS) 0.9 % injection 3 mL (3 mLs Intravenous Not Given 11/14/18 1354)     Initial Impression / Assessment and Plan / ED Course  I have reviewed the triage vital signs and the nursing notes.  Pertinent labs & imaging results that were available during my care of the patient were reviewed by me and considered in my medical decision making (see chart for details).  Clinical Course as of Nov 13 1537  Mon Nov 14, 2018  1143 Patient here for right leg calf and knee pain with chest tightness and shortness of breath for the last 5 days.  Initial troponin is 3.  Chest x-ray, CBC BMP reassuring, rLE doppler negative for DVT. Pending ddimer, otherwise unremarkable workup. Heart score 5 and trop 4 and 3 respectively   [KM]  5956 Plan is to d/c home if ddimer negative and close f/u with PMD. CTA if dimer + and dispo pending those  results. Patient remains stable and pain free at this time and is actually asking to leave. I advised her the importance of obtaining a ddimer given her symptoms.    [KM]  1536 Patient signed out to Regan Lemming resident due to change of shift.   [KM]    Clinical Course User Index [KM] Alveria Apley, PA-C         Final Clinical Impressions(s) / ED Diagnoses   Final diagnoses:  Pain of right lower extremity  Atypical chest pain    ED Discharge Orders    None       Kristine Royal 11/14/18 1539    Pattricia Boss, MD 11/16/18 1433

## 2018-11-14 NOTE — ED Notes (Signed)
Patient transported to CT 

## 2018-11-14 NOTE — Telephone Encounter (Signed)
Pt has arrived [facility] ED.  

## 2018-11-14 NOTE — Telephone Encounter (Signed)
Will monitor for ED arrival.  

## 2018-11-14 NOTE — Progress Notes (Signed)
VASCULAR LAB PRELIMINARY  PRELIMINARY  PRELIMINARY  PRELIMINARY  Right lower extremity venous duplex completed.    Preliminary report:  See CV proc for preliminary results.   Gave Madilyn Hook, PA-C results.  Facundo Allemand, RVT 11/14/2018, 2:19 PM

## 2018-11-14 NOTE — Discharge Instructions (Addendum)
Your CT scan did not show evidence of a blood clot, but did show the possible development of a pneumonia. You are stable for discharge but we would like to prescribe you antibiotics to cover for pneumonia and have you follow-up with your PCP in a couple of days. We have COVID swabbed you and you should get your results within the next 24 hours.

## 2018-11-14 NOTE — ED Notes (Signed)
Pt back in hallway spot, sts she had to go out to call her brother.

## 2018-11-14 NOTE — ED Triage Notes (Signed)
Pt reports chest tightness, right leg and calf pain. Reports dyspnea with exertion. No acute distress noted at present. VSS.

## 2018-11-14 NOTE — ED Notes (Signed)
Pt observed to not be in H019 spot, tech sts she observed pt walking out.

## 2018-11-14 NOTE — Telephone Encounter (Signed)
   Reason for Disposition . Chest pain  Answer Assessment - Initial Assessment Questions 1. LOCATION: "Where does it hurt?"       Tightness- no pain 2. RADIATION: "Does the pain go anywhere else?" (e.g., into neck, jaw, arms, back)  Almost like muscle contraction 3. ONSET: "When did the chest pain begin?" (Minutes, hours or days)      Friday at work , late Sunday evening 4. PATTERN "Does the pain come and go, or has it been constant since it started?"  "Does it get worse with exertion?"      Comes and goes, no 5. DURATION: "How long does it last" (e.g., seconds, minutes, hours)     1-2 minutes 6. SEVERITY: "How bad is the pain?"  (e.g., Scale 1-10; mild, moderate, or severe)    - MILD (1-3): doesn't interfere with normal activities     - MODERATE (4-7): interferes with normal activities or awakens from sleep    - SEVERE (8-10): excruciating pain, unable to do any normal activities       No pain- tightness -5 7. CARDIAC RISK FACTORS: "Do you have any history of heart problems or risk factors for heart disease?" (e.g., angina, prior heart attack; diabetes, high blood pressure, high cholesterol, smoker, or strong family history of heart disease)     no 8. PULMONARY RISK FACTORS: "Do you have any history of lung disease?"  (e.g., blood clots in lung, asthma, emphysema, birth control pills)     no 9. CAUSE: "What do you think is causing the chest pain?"     No- possible side effects from new medication 10. OTHER SYMPTOMS: "Do you have any other symptoms?" (e.g., dizziness, nausea, vomiting, sweating, fever, difficulty breathing, cough)       no 11. PREGNANCY: "Is there any chance you are pregnant?" "When was your last menstrual period?"       n/a  Answer Assessment - Initial Assessment Questions 1. ONSET: "When did the pain start?"      Thursday morning 2. LOCATION: "Where is the pain located?"      Knee and calf of R leg 3. PAIN: "How bad is the pain?"    (Scale 1-10; or mild,  moderate, severe)   -  MILD (1-3): doesn't interfere with normal activities    -  MODERATE (4-7): interferes with normal activities (e.g., work or school) or awakens from sleep, limping    -  SEVERE (8-10): excruciating pain, unable to do any normal activities, unable to walk     Constant- moderate 4. WORK OR EXERCISE: "Has there been any recent work or exercise that involved this part of the body?"      no 5. CAUSE: "What do you think is causing the leg pain?"     Patient thinks related to new medication 6. OTHER SYMPTOMS: "Do you have any other symptoms?" (e.g., chest pain, back pain, breathing difficulty, swelling, rash, fever, numbness, weakness)     Chest tightness 7. PREGNANCY: "Is there any chance you are pregnant?" "When was your last menstrual period?"     n/a  Protocols used: LEG PAIN-A-AH, CHEST PAIN-A-AH

## 2018-11-14 NOTE — ED Provider Notes (Signed)
  Physical Exam  BP 126/69   Pulse 69   Temp 98.2 F (36.8 C) (Oral)   Resp 18   SpO2 100%   Physical Exam Vitals signs and nursing note reviewed.  Constitutional:      General: She is not in acute distress.    Appearance: She is well-developed.  HENT:     Head: Normocephalic and atraumatic.  Eyes:     Conjunctiva/sclera: Conjunctivae normal.  Neck:     Musculoskeletal: Neck supple.  Cardiovascular:     Rate and Rhythm: Normal rate and regular rhythm.  Pulmonary:     Effort: Pulmonary effort is normal. No respiratory distress.     Breath sounds: Normal breath sounds.  Abdominal:     Palpations: Abdomen is soft.     Tenderness: There is no abdominal tenderness.  Skin:    General: Skin is warm and dry.  Neurological:     Mental Status: She is alert.     ED Course/Procedures   Clinical Course as of Nov 14 2043  Mon Nov 14, 2018  1143 Patient here for right leg calf and knee pain with chest tightness and shortness of breath for the last 5 days.  Initial troponin is 3.  Chest x-ray, CBC BMP reassuring, rLE doppler negative for DVT. Pending ddimer, otherwise unremarkable workup. Heart score 5 and trop 4 and 3 respectively   [KM]  9604 Plan is to d/c home if ddimer negative and close f/u with PMD. CTA if dimer + and dispo pending those results. Patient remains stable and pain free at this time and is actually asking to leave. I advised her the importance of obtaining a ddimer given her symptoms.    [KM]  1536 Patient signed out to [facility] resident due to change of shift.   [KM]  1630 D-Dimer, Quant(!): 1.09 [JL]    Clinical Course User Index [JL] Regan Lemming, MD [KM] Alveria Apley, PA-C    Procedures  MDM    The patient presents with leg pain, chest pain, and SOB for 5 days. RLE Doppler US for DVT normal, EKG, CXR, trops x2 neg. Waiting on d-dimer due to [facility] positive. Patient wanted to leave AMA. Can DC if normal.  The patient's d-dimer resulted positive  at 1.09. A CTA was performed which revealed no evidence of pulmonary embolism but did revealed to possible development of an early pneumonia in the left lower lobe. The patient was COVID swabbed and provided prescriptions for Azithromycin and Doxycycline to cover for CAP. She was well appearing without an oxygen requirement. She was deemed stable for discharge with a plan to follow-up with her PCP.       Regan Lemming, MD 11/14/18 2049    Deno Etienne, DO 11/14/18 2112

## 2018-11-16 DIAGNOSIS — Z20828 Contact with and (suspected) exposure to other viral communicable diseases: Secondary | ICD-10-CM | POA: Diagnosis not present

## 2018-11-18 ENCOUNTER — Encounter: Payer: Self-pay | Admitting: Family Medicine

## 2018-11-18 ENCOUNTER — Other Ambulatory Visit: Payer: Self-pay

## 2018-11-18 ENCOUNTER — Ambulatory Visit: Payer: Federal, State, Local not specified - PPO | Admitting: Family Medicine

## 2018-11-18 VITALS — BP 120/80 | HR 72 | Temp 97.1°F | Resp 16 | Ht 62.0 in | Wt 214.4 lb

## 2018-11-18 DIAGNOSIS — R202 Paresthesia of skin: Secondary | ICD-10-CM

## 2018-11-18 DIAGNOSIS — M545 Low back pain, unspecified: Secondary | ICD-10-CM

## 2018-11-18 DIAGNOSIS — R200 Anesthesia of skin: Secondary | ICD-10-CM

## 2018-11-18 DIAGNOSIS — E538 Deficiency of other specified B group vitamins: Secondary | ICD-10-CM | POA: Diagnosis not present

## 2018-11-18 DIAGNOSIS — E039 Hypothyroidism, unspecified: Secondary | ICD-10-CM | POA: Diagnosis not present

## 2018-11-18 DIAGNOSIS — G8929 Other chronic pain: Secondary | ICD-10-CM

## 2018-11-18 LAB — T4, FREE: Free T4: 0.95 ng/dL (ref 0.60–1.60)

## 2018-11-18 LAB — VITAMIN B12: Vitamin B-12: 1050 pg/mL — ABNORMAL HIGH (ref 211–911)

## 2018-11-18 LAB — TSH: TSH: 2.49 u[IU]/mL (ref 0.35–4.50)

## 2018-11-18 MED ORDER — DULOXETINE HCL 30 MG PO CPEP: 30.0000 mg | ORAL_CAPSULE | Freq: Every day | ORAL | 1 refills | Status: DC

## 2018-11-18 NOTE — Progress Notes (Signed)
HPI:   Cheyenne Hancock is a 58 y.o. female, who is here today to follow on recent ER.   Presented to the ER on 11/14/2018 complaining about right lower extremity pain and chest tightness associated with dyspnea. She was diagnosed with CAP, she is on azithromycin and doxycycline. She has tolerated medication well. Symptoms have improved. Negative for dyspnea, wheezing,or CP.   CXR normal.  Lab Results  Component Value Date   DDIMER 1.09 (H) 11/14/2018   1. No evidence of acute pulmonary embolus. 2. Patchy asymmetric opacity at the left lung base might be asymmetric atelectasis, but developing pneumonia is difficult to exclude. No pleural effusion  Lab Results  Component Value Date   WBC 4.4 11/14/2018   HGB 11.5 (L) 11/14/2018   HCT 36.1 11/14/2018   MCV 91.4 11/14/2018   PLT 306 11/14/2018   Lab Results  Component Value Date   CREATININE 0.84 11/14/2018   BUN 11 11/14/2018   NA 137 11/14/2018   K 4.1 11/14/2018   CL 106 11/14/2018   CO2 20 (L) 11/14/2018    She has had RLE pain for a while. Pain is burning like, from knee down,lateral aspect. It is constant for the past week, day and night. Pain I"easy up" when active during the day, no limitations in daily activities. Local heat also helps.  Lower back pain,no radiated. Achy pain, intermittent, 6/10.  Hx of varicose veins, wears compression stockings. No LE edema,erythema,or ulcers.   Denies fever,chills, saddle anesthesia or urine/bowel dysfunction.  Hypothyroidism: She is on Levothyroxine 25 mcg daily. Tolerating medication well. Negative for abnormal wt loss,cold.heat intolerance.   Lab Results  Component Value Date   TSH 1.43 12/06/2017   B12 deficiency: She is not on vit B12 supplementation. Lab Results  Component Value Date   VITAMINB12 261 12/06/2017    Review of Systems  Constitutional: Negative for activity change, appetite change and fatigue.  HENT: Negative  for mouth sores, nosebleeds and trouble swallowing.   Eyes: Negative for redness and visual disturbance.  Respiratory: Negative for cough and chest tightness.   Cardiovascular: Negative for palpitations and leg swelling.  Gastrointestinal: Negative for abdominal pain, nausea and vomiting.       Negative for changes in bowel habits.  Genitourinary: Negative for decreased urine volume, dysuria and hematuria.  Musculoskeletal: Negative for gait problem.  Neurological: Negative for tremors, syncope, weakness and headaches.  Rest see pertinent positives and negatives per HPI.   Current Outpatient Medications on File Prior to Visit  Medication Sig Dispense Refill  . azithromycin (ZITHROMAX) 250 MG tablet Take 1 tablet (250 mg total) by mouth daily. Take first 2 tablets together, then 1 every day until finished. 6 tablet 0  . Diethylpropion HCl CR 75 MG TB24 Take 75 mg by mouth daily.    Marland Kitchen doxycycline (VIBRAMYCIN) 100 MG capsule Take 1 capsule (100 mg total) by mouth 2 (two) times daily. 20 capsule 0  . levothyroxine (SYNTHROID) 25 MCG tablet Take 1 tablet (25 mcg total) by mouth daily before breakfast. 30 tablet 2   Current Facility-Administered Medications on File Prior to Visit  Medication Dose Route Frequency Provider Last Rate Last Dose  . 0.9 %  sodium chloride infusion  500 mL Intravenous Once Sherrilyn Rist, MD         Past Medical History:  Diagnosis Date  . Chicken pox   . Hypertension    Dx 2009-2010  . Kidney stones   .  Osteopenia    DEXA 11/22/08  . Varicose veins of bilateral lower extremities with pain    No Known Allergies  Social History   Socioeconomic History  . Marital status: Divorced    Spouse name: Not on file  . Number of children: 2  . Years of education: Not on file  . Highest education level: Not on file  Occupational History  . Not on file  Social Needs  . Financial resource strain: Not on file  . Food insecurity    Worry: Not on file     Inability: Not on file  . Transportation needs    Medical: Not on file    Non-medical: Not on file  Tobacco Use  . Smoking status: Never Smoker  . Smokeless tobacco: Never Used  Substance and Sexual Activity  . Alcohol use: No  . Drug use: No  . Sexual activity: Not on file  Lifestyle  . Physical activity    Days per week: Not on file    Minutes per session: Not on file  . Stress: Not on file  Relationships  . Social Herbalist on phone: Not on file    Gets together: Not on file    Attends religious service: Not on file    Active member of club or organization: Not on file    Attends meetings of clubs or organizations: Not on file    Relationship status: Not on file  Other Topics Concern  . Not on file  Social History Narrative  . Not on file    Vitals:   11/18/18 1026  BP: 120/80  Pulse: 72  Resp: 16  Temp: (!) 97.1 F (36.2 C)  SpO2: 99%   Body mass index is 39.21 kg/m.   Physical Exam  Nursing note and vitals reviewed. Constitutional: She is oriented to person, place, and time. She appears well-developed. She does not appear ill. No distress.  HENT:  Head: Normocephalic and atraumatic.  Eyes: Pupils are equal, round, and reactive to light. Conjunctivae are normal.  Cardiovascular: Normal rate and regular rhythm.  No murmur heard. Respiratory: Effort normal and breath sounds normal. No respiratory distress.  GI: Soft. She exhibits no mass. There is no hepatomegaly. There is no abdominal tenderness.  Musculoskeletal:        General: No edema.     Lumbar back: She exhibits no bony tenderness and no deformity.     Comments: No significant deformity appreciated. Mild tenderness upon palpation of right lumbar paraspinal muscles. Pain is not elicited with movement on exam table during examination. No local edema or erythema appreciated, no suspicious lesions.    Lymphadenopathy:    She has no cervical adenopathy.       Right: No supraclavicular  adenopathy present.       Left: No supraclavicular adenopathy present.  Neurological: She is alert and oriented to person, place, and time. She has normal strength. Gait normal.  Reflex Scores:      Patellar reflexes are 2+ on the right side and 2+ on the left side.      Achilles reflexes are 2+ on the right side and 2+ on the left side. SLR negative bilateral.  Skin: Skin is warm. No rash noted. No erythema.  Psychiatric: Her mood appears anxious.  Well groomed, good eye contact.    ASSESSMENT AND PLAN:  Ms. Cheyenne Hancock was seen today for er follow-up.  Diagnoses and all orders for this visit: Orders Placed This Encounter  Procedures  . MR Lumbar Spine Wo Contrast  . Vitamin B12   Lab Results  Component Value Date   VITAMINB12 1,050 (H) 11/18/2018   Lab Results  Component Value Date   TSH 2.49 11/18/2018    Acquired hypothyroidism No changes in current management, will follow labs done today and will give further recommendations accordingly.  -     TSH -     T4, free  Numbness and tingling of right lower extremity And burning, it seems to be getting worse. ? Radiculopathy. Cymbalta may help,she will start 30 mg daily. Some side effects discussed. Instructed about warning signs.  -     DULoxetine (CYMBALTA) 30 MG capsule; Take 1 capsule (30 mg total) by mouth daily.  Chronic right-sided low back pain, unspecified whether sciatica present Cymbalta 30 mg added today. Wt loss will also help. Instructed about warning signs.  -     DULoxetine (CYMBALTA) 30 MG capsule; Take 1 capsule (30 mg total) by mouth daily. -     MR Lumbar Spine Wo Contrast; Future  B12 deficiency Further recommendations will be given according to B12 results.    Return in about 6 weeks (around 12/30/2018) for Back paina nd RLE burning sensation.    Ellory Khurana G. [place], MD  [facility]. [place] office.

## 2018-11-18 NOTE — Patient Instructions (Signed)
A few things to remember from today's visit:   Acquired hypothyroidism - Plan: TSH, T4, free  Numbness and tingling of right lower extremity - Plan: DULoxetine (CYMBALTA) 30 MG capsule  Chronic right-sided low back pain, unspecified whether sciatica present - Plan: DULoxetine (CYMBALTA) 30 MG capsule, MR Lumbar Spine Wo Contrast  B12 deficiency - Plan: Vitamin B12  Cymbalta 30 mg started today.   Please be sure medication list is accurate. If a new problem present, please set up appointment sooner than planned today.

## 2018-11-20 ENCOUNTER — Encounter: Payer: Self-pay | Admitting: Family Medicine

## 2018-11-22 ENCOUNTER — Telehealth: Payer: Self-pay | Admitting: Family Medicine

## 2018-11-22 NOTE — Telephone Encounter (Signed)
Patient called in asking is it okay to send or bring in [facility] paperwork . Patient would like someone to give her a call back on the home phone if no answer please leave detailed message .. thank you

## 2018-11-23 NOTE — Telephone Encounter (Signed)
LMVM for the patient to contact the office. It is okay for her to bring in her FMLA paperwork. She just needs to call in the office when she arrives for [place] to pre-screen her before coming in.  The number to call is [phone]

## 2018-11-25 ENCOUNTER — Other Ambulatory Visit: Payer: Federal, State, Local not specified - PPO

## 2018-11-29 ENCOUNTER — Other Ambulatory Visit: Payer: Self-pay

## 2018-11-29 ENCOUNTER — Ambulatory Visit: Payer: Federal, State, Local not specified - PPO | Admitting: Family Medicine

## 2018-11-29 ENCOUNTER — Encounter: Payer: Self-pay | Admitting: Family Medicine

## 2018-11-29 VITALS — BP 120/68 | HR 66 | Temp 98.1°F | Resp 16 | Ht 62.0 in | Wt 214.0 lb

## 2018-11-29 DIAGNOSIS — M5410 Radiculopathy, site unspecified: Secondary | ICD-10-CM

## 2018-11-29 DIAGNOSIS — R600 Localized edema: Secondary | ICD-10-CM | POA: Diagnosis not present

## 2018-11-29 DIAGNOSIS — I8393 Asymptomatic varicose veins of bilateral lower extremities: Secondary | ICD-10-CM

## 2018-11-29 DIAGNOSIS — Z6839 Body mass index (BMI) 39.0-39.9, adult: Secondary | ICD-10-CM

## 2018-11-29 NOTE — Patient Instructions (Signed)
A few things to remember from today's visit:   Varicose veins of both lower extremities, unspecified whether complicated  Numbness and tingling of right lower extremity  I will be completing FMLA so you can work just 8 hours daily.  Vein disease is a condition that can affect the veins in the legs. It can cause leg pain, varicose veins, swollen legs, or open sores. Varicose veins are swollen and twisted veins. Things that may help: leg exercises (ankle flexion, walking),compression stocking, OTC horse chestnut seed extract 300 mg twice daily, for itchy skin cortisone and moisturizers.  Compression stockings- [facility] in [place]  Varicose Veins Varicose veins are veins that have become enlarged, bulged, and twisted. They most often appear in the legs. What are the causes? This condition is caused by damage to the valves in the vein. These valves help blood return to your heart. When they are damaged and they stop working properly, blood may flow backward and back up in the veins near the skin, causing the veins to get larger and appear twisted. The condition can result from any issue that causes blood to back up, like pregnancy, prolonged standing, or obesity. What increases the risk? This condition is more likely to develop in people who are:  On their feet a lot.  Pregnant.  Overweight. What are the signs or symptoms? Symptoms of this condition include:  Bulging, twisted, and bluish veins.  A feeling of heaviness. This may be worse at the end of the day.  Leg pain. This may be worse at the end of the day.  Swelling in the leg.  Changes in skin color over the veins. How is this diagnosed? This condition may be diagnosed based on your symptoms, a physical exam, and an ultrasound test. How is this treated? Treatment for this condition may involve:  Avoiding sitting or standing in one position for long periods of time.  Wearing compression stockings. These stockings  help to prevent blood clots and reduce swelling in the legs.  Raising (elevating) the legs when resting.  Losing weight.  Exercising regularly. If you have persistent symptoms or want to improve the way your varicose veins look, you may choose to have a procedure to close the varicose veins off or to remove them. Treatments to close off the veins include:  Sclerotherapy. In this treatment, a solution is injected into a vein to close it off.  Laser treatment. In this treatment, the vein is heated with a laser to close it off.  Radiofrequency vein ablation. In this treatment, an electrical current produced by radio waves is used to close off the vein. Treatments to remove the veins include:  Phlebectomy. In this treatment, the veins are removed through small incisions made over the veins.  Vein ligation and stripping. In this treatment, incisions are made over the veins. The veins are then removed after being tied (ligated) with stitches (sutures). Follow these instructions at home: Activity  Walk as much as possible. Walking increases blood flow. This helps blood return to the heart and takes pressure off your veins. It also increases your cardiovascular strength.  Follow your health care provider's instructions about exercising.  Do not stand or sit in one position for a long period of time.  Do not sit with your legs crossed.  Rest with your legs raised during the day. General instructions   Follow any diet instructions given to you by your health care provider.  Wear compression stockings as directed by your health  care provider. Do not wear other kinds of tight clothing around your legs, pelvis, or waist.  Elevate your legs at night to above the level of your heart.  If you get a cut in the skin over the varicose vein and the vein bleeds: ? Lie down with your leg raised. ? Apply firm pressure to the cut with a clean cloth until the bleeding stops. ? Place a bandage  (dressing) on the cut. Contact a health care provider if:  The skin around your varicose veins starts to break down.  You have pain, redness, tenderness, or hard swelling over a vein.  You are uncomfortable because of pain.  You get a cut in the skin over a varicose vein and it will not stop bleeding. Summary  Varicose veins are veins that have become enlarged, bulged, and twisted. They most often appear in the legs.  This condition is caused by damage to the valves in the vein. These valves help blood return to your heart.  Treatment for this condition includes frequent movements, wearing compression stockings, losing weight, and exercising regularly. In some cases, procedures are done to close off or remove the veins.  Treatment for this condition may include wearing compression stockings, elevating the legs, losing weight, and engaging in regular activity. In some cases, procedures are done to close off or remove the veins. This information is not intended to replace advice given to you by your health care provider. Make sure you discuss any questions you have with your health care provider. Document Released: 10/29/2004 Document Revised: 03/17/2018 Document Reviewed: 02/12/2016 Elsevier Patient Education  [place].  Please be sure medication list is accurate. If a new problem present, please set up appointment sooner than planned today.

## 2018-11-29 NOTE — Progress Notes (Signed)
HPI:  Chief Complaint  Patient presents with  . Leg Swelling    [name] is a 58 y.o. female, who is here today complaining of bilateral lower extremity edema. Problem is worse since she has been working longer hours, standing for about 10 hours straight.  Problem is exacerbated by prolonged standing/walking and alleviated by lower extremity elevation. It is greatly improved in the morning when she first gets up.  She has not been able to wear compression stockings because it makes right lower extremity burning sensation worse. She has not tried OTC medications.  Denies orthopnea, PND,sleep apnea, decreased urine output,foam in urine,or gross hematuria.  Lab Results  Component Value Date   CREATININE 0.84 11/14/2018   BUN 11 11/14/2018   NA 137 11/14/2018   K 4.1 11/14/2018   CL 106 11/14/2018   CO2 20 (L) 11/14/2018    She has not been exercising regularly. In regards to her diet, she decreased sugar and salt intake. She is on pharmacologic treatment for weight loss.  Complaining about right lower extremity pain and burning sensation on lateral aspect of thigh.  She was last seen on 11/18/2018, when Cymbalta 30 mg was started to help with lower back pain and lower extremity. She has been having nonradiating lower back pain and right lower extremity pain for a while. Lumbar MRI is pending.  Problem interferes with his sleep. It seems to be alleviated by placing a pillow under right leg. She has not noted erythema or skin lesions.  She has not started Cymbalta because she is afraid of interaction with medication she is taking for weight loss.  She is on Dithylproprion, she receives med from wt loss provider.  Review of Systems  Constitutional: Negative for activity change, appetite change, fatigue and fever.  HENT: Negative for mouth sores and nosebleeds.   Respiratory: Negative for cough and wheezing.   Cardiovascular: Negative for  chest pain and palpitations.  Gastrointestinal: Negative for abdominal pain, nausea and vomiting.       Negative for changes in bowel habits.  Genitourinary: Negative for decreased urine volume and hematuria.  Musculoskeletal: Negative for gait problem.  Skin: Negative for rash and wound.  Neurological: Negative for syncope, weakness and headaches.  Psychiatric/Behavioral: Positive for sleep disturbance. Negative for confusion. The patient is nervous/anxious.   Rest see pertinent positives and negatives per HPI.   Current Outpatient Medications on File Prior to Visit  Medication Sig Dispense Refill  . Diethylpropion HCl CR 75 MG TB24 Take 75 mg by mouth daily.    . DULoxetine (CYMBALTA) 30 MG capsule Take 1 capsule (30 mg total) by mouth daily. 30 capsule 1  . levothyroxine (SYNTHROID) 25 MCG tablet Take 1 tablet (25 mcg total) by mouth daily before breakfast. 30 tablet 2   Current Facility-Administered Medications on File Prior to Visit  Medication Dose Route Frequency Provider Last Rate Last Dose  . 0.9 %  sodium chloride infusion  500 mL Intravenous Once Sherrilyn Rist, MD         Past Medical History:  Diagnosis Date  . Chicken pox   . Hypertension    Dx 2009-2010  . Kidney stones   . Osteopenia    DEXA 11/22/08  . Varicose veins of bilateral lower extremities with pain    No Known Allergies  Social History   Socioeconomic History  . Marital status: Divorced    Spouse name: Not on file  .  Number of children: 2  . Years of education: Not on file  . Highest education level: Not on file  Occupational History  . Not on file  Social Needs  . Financial resource strain: Not on file  . Food insecurity    Worry: Not on file    Inability: Not on file  . Transportation needs    Medical: Not on file    Non-medical: Not on file  Tobacco Use  . Smoking status: Never Smoker  . Smokeless tobacco: Never Used  Substance and Sexual Activity  . Alcohol use: No  . Drug  use: No  . Sexual activity: Not on file  Lifestyle  . Physical activity    Days per week: Not on file    Minutes per session: Not on file  . Stress: Not on file  Relationships  . Social Herbalist on phone: Not on file    Gets together: Not on file    Attends religious service: Not on file    Active member of club or organization: Not on file    Attends meetings of clubs or organizations: Not on file    Relationship status: Not on file  Other Topics Concern  . Not on file  Social History Narrative  . Not on file    Vitals:   11/29/18 1020  BP: 120/68  Pulse: 66  Resp: 16  Temp: 98.1 F (36.7 C)  SpO2: 98%   Body mass index is 39.14 kg/m.  Wt Readings from Last 3 Encounters:  11/29/18 214 lb (97.1 kg)  11/18/18 214 lb 6 oz (97.2 kg)  10/14/18 217 lb (98.4 kg)    Physical Exam  Nursing note and vitals reviewed. Constitutional: She is oriented to person, place, and time. She appears well-developed. No distress.  HENT:  Head: Normocephalic and atraumatic.  Mouth/Throat: Oropharynx is clear and moist and mucous membranes are normal.  Eyes: Pupils are equal, round, and reactive to light. Conjunctivae are normal.  Cardiovascular: Normal rate and regular rhythm.  No murmur heard. Pulses:      Dorsalis pedis pulses are 2+ on the right side and 2+ on the left side.  Bilateral tortuous varicose veins.  Respiratory: Effort normal and breath sounds normal. No respiratory distress.  GI: Soft. She exhibits no mass. There is no hepatomegaly. There is no abdominal tenderness.  Musculoskeletal:        General: No tenderness or edema.  Lymphadenopathy:    She has no cervical adenopathy.  Neurological: She is alert and oriented to person, place, and time. She has normal strength. No cranial nerve deficit. Gait normal.  Skin: Skin is warm. No rash noted. No erythema.  Psychiatric: Her mood appears anxious.  Well groomed, good eye contact.    ASSESSMENT AND PLAN:    [name] was seen today for leg swelling.  Diagnoses and all orders for this visit:  Bilateral lower extremity edema Possible etiologies discussed. Hx and examination today do not suggest a serious process. LE elevation. I do not think further work up is needed today.  Varicose veins of both lower extremities, unspecified whether complicated This could be the contributing factor for LE edema. Compression stocking main line of treatment. We discussed the option of having vein specialist but she prefers to hold for now,she is not interested in invasive procedures.  Requesting FMLA for work to have hours decreased from 10 hours daily to 8 hours and not to stand for more than 4  hours.  Radicular pain of right lower extremity Pending lumbar MRI. Some side effects of Cymbalta discussed. She has tried Gabapentin before and as not helped.  Class 2 severe obesity due to excess calories with serious comorbidity and body mass index (BMI) of 39.0 to 39.9 in adult [facility](HCC) We discussed benefits of wt loss as well as adverse effects of obesity. Consistency with healthy diet and physical activity recommended.    Return in about 3 months (around 03/01/2019) for wt,LE edema.   -Ms.Blanche EastElizabeth Dillard [facility] was advised to seek immediate medical attention if sudden worsening symptoms.    Quintyn Dombek G. [place], MD  [facility]. [place] office.

## 2018-12-01 NOTE — Telephone Encounter (Signed)
FYI

## 2018-12-01 NOTE — Telephone Encounter (Unsigned)
Copied from [place] [id number]. Topic: General - Other >> Nov 28, 2018  7:34 AM Carolyn Stare wrote: Pt req a call back about a note she would like for the doctor to write for her

## 2018-12-02 ENCOUNTER — Encounter: Payer: Self-pay | Admitting: Family Medicine

## 2018-12-06 ENCOUNTER — Encounter: Payer: Self-pay | Admitting: Family Medicine

## 2018-12-06 ENCOUNTER — Telehealth: Payer: Self-pay | Admitting: *Deleted

## 2018-12-06 NOTE — Telephone Encounter (Signed)
Please advise. Have you seen this paper work?

## 2018-12-06 NOTE — Telephone Encounter (Signed)
Spoke with patient. She is aware that a message has been sent to Dr. [place] regarding this. She stated that the paper work is due on the 6th and she has to mail it.

## 2018-12-06 NOTE — Telephone Encounter (Signed)
Copied from [place] [id number]. Topic: General - Other >> Dec 06, 2018  2:07 PM Sheran Luz wrote: Patient returning call about mychart message sent today- she is requesting call back.

## 2018-12-09 ENCOUNTER — Ambulatory Visit: Payer: Federal, State, Local not specified - PPO | Admitting: Family Medicine

## 2018-12-09 NOTE — Telephone Encounter (Signed)
The patient has picked up paperwork.  No charge for form fee

## 2018-12-14 ENCOUNTER — Ambulatory Visit
Admission: RE | Admit: 2018-12-14 | Discharge: 2018-12-14 | Disposition: A | Discharge status: Home / Self Care | Payer: Federal, State, Local not specified - PPO | Source: Ambulatory Visit | Attending: Family Medicine | Admitting: Family Medicine

## 2018-12-14 ENCOUNTER — Other Ambulatory Visit: Payer: Self-pay

## 2018-12-14 DIAGNOSIS — M545 Low back pain, unspecified: Secondary | ICD-10-CM

## 2018-12-14 DIAGNOSIS — G8929 Other chronic pain: Secondary | ICD-10-CM

## 2018-12-14 DIAGNOSIS — M5136 Other intervertebral disc degeneration, lumbar region: Secondary | ICD-10-CM | POA: Diagnosis not present

## 2018-12-20 ENCOUNTER — Encounter: Payer: Self-pay | Admitting: Family Medicine

## 2018-12-20 ENCOUNTER — Other Ambulatory Visit: Payer: Self-pay

## 2018-12-20 ENCOUNTER — Ambulatory Visit: Payer: Federal, State, Local not specified - PPO | Admitting: Family Medicine

## 2018-12-20 ENCOUNTER — Other Ambulatory Visit: Payer: Self-pay | Admitting: Family Medicine

## 2018-12-20 VITALS — BP 130/70 | HR 81 | Temp 97.8°F | Resp 16 | Ht 62.0 in | Wt 212.4 lb

## 2018-12-20 DIAGNOSIS — I10 Essential (primary) hypertension: Secondary | ICD-10-CM | POA: Diagnosis not present

## 2018-12-20 DIAGNOSIS — Z1231 Encounter for screening mammogram for malignant neoplasm of breast: Secondary | ICD-10-CM

## 2018-12-20 DIAGNOSIS — N281 Cyst of kidney, acquired: Secondary | ICD-10-CM | POA: Diagnosis not present

## 2018-12-20 DIAGNOSIS — I8393 Asymptomatic varicose veins of bilateral lower extremities: Secondary | ICD-10-CM

## 2018-12-20 DIAGNOSIS — M79604 Pain in right leg: Secondary | ICD-10-CM

## 2018-12-20 MED ORDER — DULOXETINE HCL 30 MG PO CPEP: 30.0000 mg | ORAL_CAPSULE | Freq: Every day | ORAL | 0 refills | Status: DC

## 2018-12-20 NOTE — Progress Notes (Signed)
ACUTE VISIT   HPI:  Chief Complaint  Patient presents with  . Follow-up    Cheyenne Hancock is a 58 y.o. female, who is here today to follow on her last visit, 11/29/2018. She recently had lumbar MRI. Bilateral lower extremity edema, worse at the end of the day. She has not been able to wear compression stockings due to right lower extremity pain. She has not noted local erythema or skin lesions.  Right distal lower extremity pain,numbness, burning-like sensation. She is still having pain with prolonged standing at work.  She has history of lower back pain, stable for years.  Lumbar MRI on 12/14/2018: Lower lumbar facet arthropathy and mild lower lumbar degenerative disc disease. The central canal and foramina are open at all levels. Incidentally left renal cyst was seen. She has not noted gross hematuria.  She has been taking Cymbalta 30 mg daily as needed. When she takes Cymbalta at bedtime, pain is not longer interfering with sleep. Medication causes drowsiness, so she has been able to sleep throughout the night. Denies other side effects.  BP elevated today. History of hypertension, she has been on nonpharmacologic treatment. She denies checking BP at home. Denies severe/frequent headache, visual changes, chest pain, dyspnea, palpitation,or focal weakness.  Lab Results  Component Value Date   CREATININE 0.84 11/14/2018   BUN 11 11/14/2018   NA 137 11/14/2018   K 4.1 11/14/2018   CL 106 11/14/2018   CO2 20 (L) 11/14/2018    Review of Systems  Constitutional: Negative for activity change, appetite change, fatigue and fever.  HENT: Negative for mouth sores, nosebleeds and sore throat.   Respiratory: Negative for cough and wheezing.   Gastrointestinal: Negative for abdominal pain, nausea and vomiting.       Negative for changes in bowel habits.  Genitourinary: Negative for decreased urine volume and dysuria.  Neurological: Negative for  syncope, facial asymmetry and speech difficulty.  Psychiatric/Behavioral: Negative for confusion. The patient is nervous/anxious.   Rest see pertinent positives and negatives per HPI.   Current Outpatient Medications on File Prior to Visit  Medication Sig Dispense Refill  . levothyroxine (SYNTHROID) 25 MCG tablet Take 1 tablet (25 mcg total) by mouth daily before breakfast. 30 tablet 2   Current Facility-Administered Medications on File Prior to Visit  Medication Dose Route Frequency Provider Last Rate Last Dose  . 0.9 %  sodium chloride infusion  500 mL Intravenous Once Doran Stabler, MD         Past Medical History:  Diagnosis Date  . Chicken pox   . Hypertension    Dx 2009-2010  . Kidney stones   . Osteopenia    DEXA 11/22/08  . Varicose veins of bilateral lower extremities with pain    No Known Allergies  Social History   Socioeconomic History  . Marital status: Divorced    Spouse name: Not on file  . Number of children: 2  . Years of education: Not on file  . Highest education level: Not on file  Occupational History  . Not on file  Social Needs  . Financial resource strain: Not on file  . Food insecurity    Worry: Not on file    Inability: Not on file  . Transportation needs    Medical: Not on file    Non-medical: Not on file  Tobacco Use  . Smoking status: Never Smoker  . Smokeless tobacco: Never Used  Substance and Sexual Activity  .  Alcohol use: No  . Drug use: No  . Sexual activity: Not on file  Lifestyle  . Physical activity    Days per week: Not on file    Minutes per session: Not on file  . Stress: Not on file  Relationships  . Social Musician on phone: Not on file    Gets together: Not on file    Attends religious service: Not on file    Active member of club or organization: Not on file    Attends meetings of clubs or organizations: Not on file    Relationship status: Not on file  Other Topics Concern  . Not on file   Social History Narrative  . Not on file    Vitals:   12/20/18 0837  BP: 130/70  Pulse: 81  Resp: 16  Temp: 97.8 F (36.6 C)  SpO2: 99%   Body mass index is 38.85 kg/m.  Physical Exam  Nursing note and vitals reviewed. Constitutional: She is oriented to person, place, and time. She appears well-developed. No distress.  HENT:  Head: Normocephalic and atraumatic.  Eyes: Conjunctivae are normal.  Cardiovascular: Normal rate and regular rhythm.  No murmur heard. Pulses:      Dorsalis pedis pulses are 2+ on the right side and 2+ on the left side.  Varicose veins LE,bilateral.  Respiratory: Effort normal and breath sounds normal. No respiratory distress.  GI: Soft. There is no abdominal tenderness.  Musculoskeletal:        General: No edema.  Neurological: She is alert and oriented to person, place, and time. She has normal strength. No cranial nerve deficit. Gait normal.  Skin: Skin is warm. No rash noted. No erythema.  Psychiatric: Her mood appears anxious.  Well groomed, good eye contact.    ASSESSMENT AND PLAN:  Cheyenne Hancock was seen today for follow-up.  Diagnoses and all orders for this visit:  Pain of right lower extremity Burning/numbness like sensation. Cymbalta 30 mg at bedtime is helping with pain throughout the night, recommend taking it daily. Lumbar MRI shows some mild-moderate degenerative changes, no significant bulged discs or lumbar stenosis. It could be related to vein disease, so referral to vein specialist was placed.  -     Ambulatory referral to Vascular Surgery  Varicose veins of both lower extremities, unspecified whether complicated She cannot tolerate compression stockings due to right lower extremity pain. Continue lower extremity elevation a few times during the afternoon. Vein specialist referral placed.  -     Ambulatory referral to Vascular Surgery  Hypertension, essential, benign Initially BP 144/76. Re-checked 130/70. Low-salt  diet recommended. Instructed to monitor BP regularly at home. Instructed about warning signs.  Renal cyst, left Reassured, most likely benign. I do not think further imaging is needed at this time.   Return in about 4 months (around 04/19/2019).   Jovi Zavadil G. [place], MD  [facility]. [place] office.

## 2018-12-20 NOTE — Patient Instructions (Addendum)
A few things to remember from today's visit:   Pain of right lower extremity - Plan: Ambulatory referral to Vascular Surgery  Varicose veins of both lower extremities, unspecified whether complicated - Plan: Ambulatory referral to Vascular Surgery  No changes in Cymbalta, take medication daily. Monitor blood pressure at home. Low salt diet. Pending appt with vein specialist.  Please be sure medication list is accurate. If a new problem present, please set up appointment sooner than planned today.

## 2019-01-14 ENCOUNTER — Other Ambulatory Visit: Payer: Self-pay | Admitting: Family Medicine

## 2019-01-14 DIAGNOSIS — E039 Hypothyroidism, unspecified: Secondary | ICD-10-CM

## 2019-01-22 ENCOUNTER — Other Ambulatory Visit: Payer: Self-pay

## 2019-01-22 DIAGNOSIS — I83813 Varicose veins of bilateral lower extremities with pain: Secondary | ICD-10-CM

## 2019-01-23 ENCOUNTER — Ambulatory Visit (HOSPITAL_COMMUNITY)
Admission: RE | Admit: 2019-01-23 | Discharge: 2019-01-23 | Disposition: A | Discharge status: Home / Self Care | Payer: Federal, State, Local not specified - PPO | Source: Ambulatory Visit | Attending: Family | Admitting: Family

## 2019-01-23 ENCOUNTER — Encounter: Payer: Self-pay | Admitting: Family

## 2019-01-23 ENCOUNTER — Ambulatory Visit (INDEPENDENT_AMBULATORY_CARE_PROVIDER_SITE_OTHER): Payer: Federal, State, Local not specified - PPO | Admitting: Family

## 2019-01-23 ENCOUNTER — Other Ambulatory Visit: Payer: Self-pay

## 2019-01-23 VITALS — BP 140/75 | HR 79 | Temp 97.2°F | Resp 16 | Ht 62.0 in | Wt 218.0 lb

## 2019-01-23 DIAGNOSIS — I83813 Varicose veins of bilateral lower extremities with pain: Secondary | ICD-10-CM | POA: Insufficient documentation

## 2019-01-23 DIAGNOSIS — M79661 Pain in right lower leg: Secondary | ICD-10-CM

## 2019-01-23 DIAGNOSIS — M25561 Pain in right knee: Secondary | ICD-10-CM | POA: Diagnosis not present

## 2019-01-23 NOTE — Progress Notes (Signed)
Referred by:  [place], Betty G, MD [address] [place],  [place] [address]  Reason for referral: burning sensation at the lateral aspect of her calf for 5 months, has worsened slightly, awakens her at night. She c/o deep pain in her right knee for about a month.   History of Present Illness  Lanyah Spengler is a 58 y.o. (09-Sep-1960) female who presents with chief complaint:  She c/o burning sensation at the lateral aspect of her calf for 5 months, has worsened slightly, awakens her at night.  She c/o deep pain in her right knee for about a month.   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 She works for the [employer].   She has worn knee high compression hose for 3 years. She fell on her right leg at work in 2019, states she was evaluated, had physical therapy.  She denies any know history of vein problems in her family. She reports swelling in both legs after standing for 12 hours at her job, she now works no more than 8 hours.  She denies swelling in her legs since her hours working have been reduced.   Tobacco use: none DM: no   Past Medical History:  Diagnosis Date  . Chicken pox   . Hypertension    Dx 2009-2010  . Kidney stones   . Osteopenia    DEXA 11/22/08  . Varicose veins of bilateral lower extremities with pain     Past Surgical History:  Procedure Laterality Date  . TUBAL LIGATION  11/24/1984    Social History   Socioeconomic History  . Marital status: Divorced    Spouse name: Not on file  . Number of children: 2  . Years of education: Not on file  . Highest education level: Not on file  Occupational History  . Not on file  Tobacco Use  . Smoking status: Never Smoker  . Smokeless tobacco: Never Used  Substance and Sexual Activity  . Alcohol use: No  . Drug use: No  . Sexual activity: Not on file  Other Topics Concern  . Not on  file  Social History Narrative  . Not on file   Social Determinants of Health   Financial Resource Strain:   . Difficulty of Paying Living Expenses: Not on file  Food Insecurity:   . Worried About Programme researcher, broadcasting/film/video in the Last Year: Not on file  . Ran Out of Food in the Last Year: Not on file  Transportation Needs:   . Lack of Transportation (Medical): Not on file  . Lack of Transportation (Non-Medical): Not on file  Physical Activity:   . Days of Exercise per Week: Not on file  . Minutes of Exercise per Session: Not on file  Stress:   . Feeling of Stress : Not on file  Social Connections:   . Frequency of Communication with Friends and Family: Not on file  . Frequency of Social Gatherings with Friends and Family: Not on file  . Attends Religious Services: Not on file  . Active Member of Clubs or Organizations: Not on file  . Attends Banker Meetings: Not on file  . Marital Status: Not on file  Intimate Partner Violence:   . Fear of Current or Ex-Partner: Not on file  . Emotionally Abused: Not on file  . Physically Abused:  Not on file  . Sexually Abused: Not on file    Family History  Problem Relation Age of Onset  . Arthritis Mother   . Stroke Mother   . Hypertension Mother   . Cancer Sister        breast  . Breast cancer Sister   . Cancer Paternal Uncle        lung and prostate  . Breast cancer Maternal Grandmother   . Stomach cancer Maternal Aunt   . Colon cancer Neg Hx   . Esophageal cancer Neg Hx   . Liver cancer Neg Hx   . Pancreatic cancer Neg Hx   . Rectal cancer Neg Hx     Current Outpatient Medications on File Prior to Visit  Medication Sig Dispense Refill  . DULoxetine (CYMBALTA) 30 MG capsule Take 1 capsule (30 mg total) by mouth daily. 90 capsule 0  . levothyroxine (SYNTHROID) 25 MCG tablet TAKE 1 TABLET(25 MCG) BY MOUTH DAILY BEFORE BREAKFAST 30 tablet 3   Current Facility-Administered Medications on File Prior to Visit    Medication Dose Route Frequency Provider Last Rate Last Admin  . 0.9 %  sodium chloride infusion  500 mL Intravenous Once Charlie Pitter III, MD        No Known Allergies  REVIEW OF SYSTEMS: Cardiovascular: No chest pain, chest pressure, palpitations, orthopnea, or dyspnea on exertion. No claudication or rest pain,  No history of DVT or phlebitis. Pulmonary: No productive cough, asthma or wheezing. Neurologic: No weakness, paresthesias, aphasia, or amaurosis. No dizziness. + burning sensation right lateral calf x 1 month Hematologic: No bleeding problems or clotting disorders. Musculoskeletal: + joint pain in right knee, no joint swelling. Gastrointestinal: No blood in stool or hematemesis Genitourinary: No dysuria or hematuria. Psychiatric:: No history of major depression. Integumentary: No rashes or ulcers. Constitutional: No fever or chills.  Physical Examination Vitals:   01/23/19 1513  BP: 140/75  Pulse: 79  Resp: 16  Temp: (!) 97.2 F (36.2 C)  TempSrc: Oral  SpO2: 98%  Weight: 218 lb (98.9 kg)  Height: 5\' 2"  (1.575 m)   Body mass index is 39.87 kg/m.  PHYSICAL EXAMINATION: General: Obese female in NAD  HEENT:  No gross abnormalities Pulmonary: Respirations are non-labored, good air movement in all fields, no rales, rhonchi, or wheezes Abdomen: Soft and non-tender with normal bowel sounds. Musculoskeletal: There are no major deformities.   Neurologic: No focal weakness or paresthesias are detected, muscle strength is 5/5 in all extremities.  Skin: There are no ulcer or rashes noted. Psychiatric: The patient has normal affect. Cardiovascular: There is a regular rate and rhythm without significant murmur appreciated.   Vascular: Vessel Right Left  Radial Palpable Palpable  Carotid  without bruit  without bruit  Aorta Not palpable N/A  Popliteal Not palpable Not palpable  PT Palpable Palpable  DP Palpable Palpable   Non-Invasive Vascular Imaging  Right LE  Venous Duplex (Date: 01/23/2019):  Venous Reflux Times +---------------+------+-----------+------------+------------------------------+ RIGHT          RefluxReflux TimeDiameter cmsComments                                       Yes                                                        +---------------+------+-----------+------------+------------------------------+  CFV             yes   >1 second                                            +---------------+------+-----------+------------+------------------------------+ GSV at [facility]      yes    >500 ms      0.64                                   +---------------+------+-----------+------------+------------------------------+ GSV prox thigh   no                 0.28                                   +---------------+------+-----------+------------+------------------------------+ GSV mid thigh    no                 0.31                                   +---------------+------+-----------+------------+------------------------------+ GSV dist thigh   no                 0.35                                   +---------------+------+-----------+------------+------------------------------+ GSV at knee      no                 0.30                                   +---------------+------+-----------+------------+------------------------------+ SSV Pop Fossa    no                 0.34                                   +---------------+------+-----------+------------+------------------------------+ Anterior branch yes    >500 ms      0.48    reflux in the proximal and mid                                             segment. Branches out of                                                   fascia in mid thigh.           +---------------+------+-----------+------------+------------------------------+ Summary: Right:  There is no evidence of deep vein thrombosis in the lower  extremity. There is no evidence of superficial venous thrombosis. There is evidence of deep vein reflux in the common femoral vein. There is no evidence of great saphenous vein reflux. There is no evidence of small saphenous  vein reflux. 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 female who presents with:burning sensation at the lateral aspect of her calf for 5 months, has worsened slightly, awakens her at night. She c/o deep pain in her right knee for about a month.   Her pedal pulses are palpable, good arterial perfusion to her legs.  On venous duplex today of right leg, no DVT.  She no longer has swelling in her legs since she is working less hours, not on her feet as much.  Chronic back strain, lifting heavy bags of mail for 21 years; pain in right knee, fell on right leg in 2019.  Her symptoms are consistent with muscoskeletal issue, and lumbar spine issues.  She has seen ortho in the past about her right leg injury.  Pt will discuss with her PCP possible referral back to ortho for knee evaluation, and possible lumbar spine evaluation.   Pt has been wearing knee high compression hose, measured, and supplied to her by her insurance, she states she will ask the to measure her legs for thigh high compression hose; she decline to return for evaluation after 3 months trial of thigh high compression hose.    Charisse MarchSuzanne Gayl Ivanoff, RN, MSN, FNP-C [facility] Office: [phone]  01/23/2019, 3:29 PM  Clinic MD: Myra GianottiBrabham

## 2019-01-23 NOTE — Patient Instructions (Signed)
  To decrease swelling in your feet and legs: Elevate feet above slightly bent knees, feet above heart, overnight and 3-4 times per day for 20 minutes.   

## 2019-02-15 ENCOUNTER — Other Ambulatory Visit: Payer: Self-pay

## 2019-02-15 ENCOUNTER — Ambulatory Visit
Admission: RE | Admit: 2019-02-15 | Discharge: 2019-02-15 | Disposition: A | Discharge status: Home / Self Care | Payer: Federal, State, Local not specified - PPO | Source: Ambulatory Visit | Attending: Family Medicine | Admitting: Family Medicine

## 2019-02-15 DIAGNOSIS — Z1231 Encounter for screening mammogram for malignant neoplasm of breast: Secondary | ICD-10-CM | POA: Diagnosis not present

## 2019-03-01 DIAGNOSIS — Z20822 Contact with and (suspected) exposure to covid-19: Secondary | ICD-10-CM | POA: Diagnosis not present

## 2019-03-01 DIAGNOSIS — Z20828 Contact with and (suspected) exposure to other viral communicable diseases: Secondary | ICD-10-CM | POA: Diagnosis not present

## 2019-03-21 ENCOUNTER — Other Ambulatory Visit: Payer: Self-pay

## 2019-03-22 ENCOUNTER — Other Ambulatory Visit: Payer: Self-pay | Admitting: Family Medicine

## 2019-03-22 ENCOUNTER — Ambulatory Visit: Payer: Federal, State, Local not specified - PPO | Admitting: Family Medicine

## 2019-03-22 NOTE — Telephone Encounter (Signed)
Pt need to switch pharmacy and want to know if this could be done  Per pt call was prescribed  gabapentin (NEURONTIN) 100 MG capsule 90 capsule 03/22/2019   Sig - Route: Take 3 capsules (300 mg total) by mouth at bedtime. - Oral  A patient would like this called into  [facility] [phone] pt said that  she no longer uses the   [id number] - [place], [place] - [address]

## 2019-03-22 NOTE — Telephone Encounter (Signed)
Please advise, I don't see med listed on her medication list. Pt has appointment with you on 2/22.

## 2019-03-27 ENCOUNTER — Ambulatory Visit: Payer: Federal, State, Local not specified - PPO | Admitting: Family Medicine

## 2019-03-28 NOTE — Addendum Note (Signed)
Addended by: Kathreen Devoid on: 03/28/2019 08:45 AM   Modules accepted: Orders

## 2019-03-29 ENCOUNTER — Telehealth: Payer: Self-pay

## 2019-03-29 NOTE — Telephone Encounter (Signed)
Pt had requested a refill on medication from Dr. Sw[place]iland - but she hasn't prescribed it before. She just needs a virtual visit - can use the 3:00 slot on Friday.

## 2019-03-30 ENCOUNTER — Telehealth: Payer: Self-pay | Admitting: Family Medicine

## 2019-03-30 NOTE — Telephone Encounter (Signed)
LMVM for the patient to contact the office. 

## 2019-03-30 NOTE — Telephone Encounter (Signed)
LMVM for the patient to contact the office to schedule a virtual appointment in order to get a Rx refill that Dr. Swaziland hasn't prescribed to the patient.  Please advise

## 2019-04-04 NOTE — Telephone Encounter (Signed)
Pt called in and stated that she has not called in for any refills and is not sure what the gabapentin is for.  Pt verified her medication and has an appointment on 04/07/2019 with Dr. [place]iland.

## 2019-04-07 ENCOUNTER — Encounter: Payer: Self-pay | Admitting: Family Medicine

## 2019-04-07 ENCOUNTER — Other Ambulatory Visit: Payer: Self-pay

## 2019-04-07 ENCOUNTER — Ambulatory Visit (INDEPENDENT_AMBULATORY_CARE_PROVIDER_SITE_OTHER): Payer: Federal, State, Local not specified - PPO | Admitting: Family Medicine

## 2019-04-07 VITALS — BP 130/70 | HR 110 | Resp 16 | Ht 62.0 in | Wt 217.0 lb

## 2019-04-07 DIAGNOSIS — E039 Hypothyroidism, unspecified: Secondary | ICD-10-CM

## 2019-04-07 DIAGNOSIS — E038 Other specified hypothyroidism: Secondary | ICD-10-CM | POA: Insufficient documentation

## 2019-04-07 DIAGNOSIS — R200 Anesthesia of skin: Secondary | ICD-10-CM | POA: Diagnosis not present

## 2019-04-07 DIAGNOSIS — I10 Essential (primary) hypertension: Secondary | ICD-10-CM

## 2019-04-07 DIAGNOSIS — I8393 Asymptomatic varicose veins of bilateral lower extremities: Secondary | ICD-10-CM

## 2019-04-07 DIAGNOSIS — Z6837 Body mass index (BMI) 37.0-37.9, adult: Secondary | ICD-10-CM

## 2019-04-07 MED ORDER — DULOXETINE HCL 30 MG PO CPEP: 30.0000 mg | ORAL_CAPSULE | Freq: Every day | ORAL | 0 refills | Status: DC

## 2019-04-07 NOTE — Assessment & Plan Note (Signed)
BP adequately controlled. Continue nonpharmacologic treatment. Recommend monitoring BP regularly. Continue low-salt diet. 

## 2019-04-07 NOTE — Progress Notes (Signed)
HPI:   Cheyenne Hancock is a 59 y.o. female, who is here today for follow up.   She was last seen on 12/20/18.   EYC:XKGY visit BP was mildly elevated. She is not checking BP at home. Currently she is on nonpharmacologic treatment. Denies severe/frequent headache, visual changes, chest pain, dyspnea, palpitation, claudication, or focal weakness.  Lab Results  Component Value Date   CREATININE 0.84 11/14/2018   BUN 11 11/14/2018   NA 137 11/14/2018   K 4.1 11/14/2018   CL 106 11/14/2018   CO2 20 (L) 11/14/2018   Cymbalta 30 mg was added to help with right lower extremity pain:Burning ,tingling,andf numbness. Since she started medication she has not had any pain in the extremity but medication is causing drowsiness. She would like to try to stop it. She is also wearing compression stockings.  Hypothyroidism: Currently she is on levothyroxine 25 mcg daily. Initial TSH was slightly elevated and she has not had symptoms. She wonders if she can stop medication.  Lab Results  Component Value Date   TSH 2.49 11/18/2018    She has not been back to weight loss clinic and she stopped taking phentermine. She is not exercising regularly nor following a healthful diet. She is planning on going back after she discontinues Cymbalta.   Review of Systems  Constitutional: Negative for activity change, appetite change, fatigue and fever.  HENT: Negative for mouth sores, nosebleeds, sore throat and trouble swallowing.   Respiratory: Negative for cough and wheezing.   Gastrointestinal: Negative for abdominal pain, nausea and vomiting.       Negative for changes in bowel habits.  Endocrine: Negative for cold intolerance and heat intolerance.  Genitourinary: Negative for decreased urine volume, dysuria and hematuria.  Neurological: Negative for syncope and facial asymmetry.  Rest of ROS, see pertinent positives sand negatives in HPI  No current outpatient medications  on file prior to visit.   Current Facility-Administered Medications on File Prior to Visit  Medication Dose Route Frequency Provider Last Rate Last Admin  . 0.9 %  sodium chloride infusion  500 mL Intravenous Once Sherrilyn Rist, MD        Past Medical History:  Diagnosis Date  . Chicken pox   . Hypertension    Dx 2009-2010  . Kidney stones   . Osteopenia    DEXA 11/22/08  . Varicose veins of bilateral lower extremities with pain    No Known Allergies  Social History   Socioeconomic History  . Marital status: Divorced    Spouse name: Not on file  . Number of children: 2  . Years of education: Not on file  . Highest education level: Not on file  Occupational History  . Not on file  Tobacco Use  . Smoking status: Never Smoker  . Smokeless tobacco: Never Used  Substance and Sexual Activity  . Alcohol use: No  . Drug use: No  . Sexual activity: Not on file  Other Topics Concern  . Not on file  Social History Narrative  . Not on file   Social Determinants of Health   Financial Resource Strain:   . Difficulty of Paying Living Expenses: Not on file  Food Insecurity:   . Worried About Programme researcher, broadcasting/film/video in the Last Year: Not on file  . Ran Out of Food in the Last Year: Not on file  Transportation Needs:   . Lack of Transportation (Medical): Not on file  .  Lack of Transportation (Non-Medical): Not on file  Physical Activity:   . Days of Exercise per Week: Not on file  . Minutes of Exercise per Session: Not on file  Stress:   . Feeling of Stress : Not on file  Social Connections:   . Frequency of Communication with Friends and Family: Not on file  . Frequency of Social Gatherings with Friends and Family: Not on file  . Attends Religious Services: Not on file  . Active Member of Clubs or Organizations: Not on file  . Attends Archivist Meetings: Not on file  . Marital Status: Not on file    Vitals:   04/07/19 1115  BP: 130/70  Pulse: (!) 110    Resp: 16  SpO2: 96%   Wt Readings from Last 3 Encounters:  04/07/19 217 lb (98.4 kg)  01/23/19 218 lb (98.9 kg)  12/20/18 212 lb 6.4 oz (96.3 kg)    Body mass index is 39.69 kg/m.   Physical Exam  Nursing note and vitals reviewed. Constitutional: She is oriented to person, place, and time. She appears well-developed. No distress.  HENT:  Head: Normocephalic and atraumatic.  Eyes: Pupils are equal, round, and reactive to light. Conjunctivae are normal.  Cardiovascular: Normal rate and regular rhythm.  No murmur heard. Pulses:      Dorsalis pedis pulses are 2+ on the right side and 2+ on the left side.  Respiratory: Effort normal and breath sounds normal. No respiratory distress.  GI: Soft. She exhibits no mass. There is no hepatomegaly. There is no abdominal tenderness.  Musculoskeletal:        General: No edema.  Lymphadenopathy:    She has no cervical adenopathy.  Neurological: She is alert and oriented to person, place, and time. She has normal strength. No cranial nerve deficit. Gait normal.  Skin: Skin is warm. No rash noted. No erythema.  Psychiatric: She has a normal mood and affect.  Well groomed, good eye contact.   ASSESSMENT AND PLAN:   [name] was seen today for follow-up.  Orders Placed This Encounter  Procedures  . TSH   Lab Results  Component Value Date   TSH 2.49 11/18/2018    Subclinical hypothyroidism She will go ahead and discontinue levothyroxine. TSH in 6 to 8 weeks. Continue following annually.  Varicose veins of both lower extremities Problem is well controlled. Compression stockings has helped with lower extremity edema.  Class 2 obesity with body mass index (BMI) of 37.0 to 37.9 in adult We discussed benefits of wt loss.  Consistency with healthy diet and physical activity recommended. We also discussed some side effects of phentermine, including BP elevation. Recommend monitoring BP more frequent if she  decides to resume phentermine. Continue following with weight loss clinic.  Numbness of right lower extremity Problem has resolved since she started Cymbalta 30 mg daily.  Still she would like to discontinue medication, so recommend taking Cymbalta every other day for 2 weeks and then every third day for 2 weeks. She will let me know if pain reoccurs.  Hypertension, essential, benign BP adequately controlled. Continue nonpharmacologic treatment. Recommend monitoring BP regularly. Continue low-salt diet.    Return in about 1 year (around 04/06/2020) for cpe.   Chrisoula Zegarra G. [place], MD  [facility]. [place] office.

## 2019-04-07 NOTE — Assessment & Plan Note (Signed)
Problem is well controlled. Compression stockings has helped with lower extremity edema.

## 2019-04-07 NOTE — Patient Instructions (Signed)
A few things to remember from today's visit:   Hypertension, essential, benign  Varicose veins of both lower extremities, unspecified whether complicated  Subclinical hypothyroidism  Right leg pain  Start weaning off Cymbalta 1 capsule every other day for 2 weeks then every third day for 2 weeks and stop it. If you want you can also stop levothyroxine and we can recheck your thyroid in 6 to 8 weeks. Monitor your blood pressure at home, phentermine can cause blood pressure elevation.  Please be sure medication list is accurate. If a new problem present, please set up appointment sooner than planned today.

## 2019-04-07 NOTE — Assessment & Plan Note (Signed)
She will go ahead and discontinue levothyroxine. TSH in 6 to 8 weeks. Continue following annually.

## 2019-04-07 NOTE — Assessment & Plan Note (Signed)
We discussed benefits of wt loss.  Consistency with healthy diet and physical activity recommended. We also discussed some side effects of phentermine, including BP elevation. Recommend monitoring BP more frequent if she decides to resume phentermine. Continue following with weight loss clinic.

## 2019-04-07 NOTE — Assessment & Plan Note (Signed)
Problem has resolved since she started Cymbalta 30 mg daily.  Still she would like to discontinue medication, so recommend taking Cymbalta every other day for 2 weeks and then every third day for 2 weeks. She will let me know if pain reoccurs.

## 2019-05-05 DIAGNOSIS — Z03818 Encounter for observation for suspected exposure to other biological agents ruled out: Secondary | ICD-10-CM | POA: Diagnosis not present

## 2019-05-05 DIAGNOSIS — Z20828 Contact with and (suspected) exposure to other viral communicable diseases: Secondary | ICD-10-CM | POA: Diagnosis not present

## 2019-05-19 DIAGNOSIS — Z20822 Contact with and (suspected) exposure to covid-19: Secondary | ICD-10-CM | POA: Diagnosis not present

## 2019-05-19 DIAGNOSIS — Z03818 Encounter for observation for suspected exposure to other biological agents ruled out: Secondary | ICD-10-CM | POA: Diagnosis not present

## 2019-05-19 DIAGNOSIS — Z20828 Contact with and (suspected) exposure to other viral communicable diseases: Secondary | ICD-10-CM | POA: Diagnosis not present

## 2019-06-01 ENCOUNTER — Other Ambulatory Visit: Payer: Self-pay

## 2019-06-02 ENCOUNTER — Encounter: Payer: Self-pay | Admitting: Family Medicine

## 2019-06-02 ENCOUNTER — Ambulatory Visit (INDEPENDENT_AMBULATORY_CARE_PROVIDER_SITE_OTHER): Payer: Federal, State, Local not specified - PPO | Admitting: Family Medicine

## 2019-06-02 VITALS — BP 124/82 | HR 72 | Temp 97.0°F | Resp 12 | Ht 62.0 in | Wt 221.1 lb

## 2019-06-02 DIAGNOSIS — E039 Hypothyroidism, unspecified: Secondary | ICD-10-CM

## 2019-06-02 DIAGNOSIS — Z6837 Body mass index (BMI) 37.0-37.9, adult: Secondary | ICD-10-CM

## 2019-06-02 DIAGNOSIS — R600 Localized edema: Secondary | ICD-10-CM

## 2019-06-02 DIAGNOSIS — R739 Hyperglycemia, unspecified: Secondary | ICD-10-CM

## 2019-06-02 DIAGNOSIS — Z13220 Encounter for screening for lipoid disorders: Secondary | ICD-10-CM | POA: Diagnosis not present

## 2019-06-02 DIAGNOSIS — I10 Essential (primary) hypertension: Secondary | ICD-10-CM

## 2019-06-02 DIAGNOSIS — E038 Other specified hypothyroidism: Secondary | ICD-10-CM

## 2019-06-02 LAB — LIPID PANEL
Cholesterol: 228 mg/dL — ABNORMAL HIGH (ref 0–200)
HDL: 57 mg/dL (ref >39.00)
LDL Cholesterol: 152 mg/dL — ABNORMAL HIGH (ref 0–99)
NonHDL: 171.08
Total CHOL/HDL Ratio: 4
Triglycerides: 94 mg/dL (ref 0.0–149.0)
VLDL: 18.8 mg/dL (ref 0.0–40.0)

## 2019-06-02 LAB — TSH: TSH: 3.76 u[IU]/mL (ref 0.35–4.50)

## 2019-06-02 LAB — BASIC METABOLIC PANEL
BUN: 15 mg/dL (ref 6–23)
CO2: 28 mEq/L (ref 19–32)
Calcium: 9.3 mg/dL (ref 8.4–10.5)
Chloride: 105 mEq/L (ref 96–112)
Creatinine, Ser: 0.86 mg/dL (ref 0.40–1.20)
GFR: 81.2 mL/min (ref >60.00)
Glucose, Bld: 102 mg/dL — ABNORMAL HIGH (ref 70–99)
Potassium: 4.4 mEq/L (ref 3.5–5.1)
Sodium: 140 mEq/L (ref 135–145)

## 2019-06-02 LAB — HEMOGLOBIN A1C: Hgb A1c MFr Bld: 5.7 % (ref 4.6–6.5)

## 2019-06-02 NOTE — Progress Notes (Signed)
HPI:   Ms.Natsuko Dillard Herst is a 59 y.o. female, who is here today for follow up.   She was last seen on 04/07/19. Evaluated by vascular in 01/2019 due to concerns abut LE edema.  -11/2018 Cymbalta added to help with lower back pain and RLE pain/burning and numbness sensation. Cymbalta 30 mg added and lumbar MRI obtained. On 12/20/18 she reported great improvement in RLE pain, not longer interfering with sleep. On 04/07/19 she asked to stop Cymbalta 30 mg. Back and leg pain have resolved with healing session at church.  -She also stopped Levothyroxine 25 mcg. Negative for cold/heat intolerance,tremor,or constipation/diarreha. Last TSH 2.4 in 11/2018.  Left chest "soreness,,intermittent for months. Pain happens at work with heavy lifting. It is 8/10. + Sensation of SOB,. No hx of trauma. Problem is stable. Pain is not radiated. No associated diaphoresis,palpitation,or syncope.  At rest or during vacation time she has no symptoms. No CP or SOB with exertional activities at home.  Glucose has been mildly elevated in the past. Negative for polydipsia,polyuria, or polyphagia.  Lab Results  Component Value Date   HGBA1C 5.6 10/06/2016   She is also concerned about wt gain, she is positive it is due to fluid retention because she can see LE edema, mostly at the end of the day. She has not noted orthopnea,PND,decreased urine output,gross hematuria,or foam in urine. LE edema improves with elevation.  She is not exercising regularly. She eats cheerios for breakfast with almond milk. Mixed nuts a few times during the day for snack. Fruits like oranges,grape fruit,grapes.  Salad and fried seafood for lunch/dinner.   HTN: She is on non pharmacologic treatment. She is not checking BP regularly.  Component     Latest Ref Rng & Units 11/14/2018  Sodium     135 - 145 mEq/L 137  Potassium     3.5 - 5.1 mEq/L 4.1  Chloride     96 - 112 mEq/L 106  CO2     19 -  32 mEq/L 20 (L)  Glucose     70 - 99 mg/dL 104 (H)  BUN     6 - 23 mg/dL 11  Creatinine     0.40 - 1.20 mg/dL 0.84  Calcium     8.4 - 10.5 mg/dL 8.9  GFR, Est Non African American     >60 mL/min >60  GFR, Est African American     >60 mL/min >60  Anion gap     5 - 15 11    Review of Systems  Constitutional: Negative for activity change, appetite change, fatigue and fever.  HENT: Negative for mouth sores, nosebleeds and sore throat.   Eyes: Negative for redness and visual disturbance.  Respiratory: Negative for cough and wheezing.   Gastrointestinal: Negative for abdominal pain, nausea and vomiting.       Negative for changes in bowel habits.  Genitourinary: Negative for difficulty urinating and dysuria.  Musculoskeletal: Negative for gait problem and myalgias.  Skin: Negative for rash and wound.  Neurological: Negative for syncope, weakness and headaches.  Psychiatric/Behavioral: Negative for confusion.  Rest of ROS, see pertinent positives sand negatives in HPI  No current outpatient medications on file prior to visit.   Current Facility-Administered Medications on File Prior to Visit  Medication Dose Route Frequency Provider Last Rate Last Admin  . 0.9 %  sodium chloride infusion  500 mL Intravenous Once Doran Stabler, MD  Past Medical History:  Diagnosis Date  . Chicken pox   . Hypertension    Dx 2009-2010  . Kidney stones   . Osteopenia    DEXA 11/22/08  . Varicose veins of bilateral lower extremities with pain    No Known Allergies  Social History   Socioeconomic History  . Marital status: Divorced    Spouse name: Not on file  . Number of children: 2  . Years of education: Not on file  . Highest education level: Not on file  Occupational History  . Not on file  Tobacco Use  . Smoking status: Never Smoker  . Smokeless tobacco: Never Used  Substance and Sexual Activity  . Alcohol use: No  . Drug use: No  . Sexual activity: Not on file    Other Topics Concern  . Not on file  Social History Narrative  . Not on file   Social Determinants of Health   Financial Resource Strain:   . Difficulty of Paying Living Expenses:   Food Insecurity:   . Worried About Programme researcher, broadcasting/film/video in the Last Year:   . Barista in the Last Year:   Transportation Needs:   . Freight forwarder (Medical):   Marland Kitchen Lack of Transportation (Non-Medical):   Physical Activity:   . Days of Exercise per Week:   . Minutes of Exercise per Session:   Stress:   . Feeling of Stress :   Social Connections:   . Frequency of Communication with Friends and Family:   . Frequency of Social Gatherings with Friends and Family:   . Attends Religious Services:   . Active Member of Clubs or Organizations:   . Attends Banker Meetings:   Marland Kitchen Marital Status:     Vitals:   06/02/19 0825  BP: 124/82  Pulse: 72  Resp: 12  Temp: (!) 97 F (36.1 C)  SpO2: 98%   Wt Readings from Last 3 Encounters:  06/02/19 221 lb 2 oz (100.3 kg)  04/07/19 217 lb (98.4 kg)  01/23/19 218 lb (98.9 kg)    Body mass index is 40.44 kg/m.  Physical Exam  Nursing note and vitals reviewed. Constitutional: She is oriented to person, place, and time. She appears well-developed. No distress.  HENT:  Head: Normocephalic and atraumatic.  Mouth/Throat: Oropharynx is clear and moist and mucous membranes are normal.  Eyes: Pupils are equal, round, and reactive to light. Conjunctivae are normal.  Cardiovascular: Normal rate and regular rhythm.  No murmur heard. Pulses:      Dorsalis pedis pulses are 2+ on the right side and 2+ on the left side.  Tortuous varicose veins, R>L  Respiratory: Effort normal and breath sounds normal. No respiratory distress.  GI: Soft. She exhibits no mass. There is no hepatomegaly. There is no abdominal tenderness.  Musculoskeletal:        General: Edema (Trace pitting LE edema, bilateral.) present.  Lymphadenopathy:    She has no  cervical adenopathy.  Neurological: She is alert and oriented to person, place, and time. She has normal strength. No cranial nerve deficit. Gait normal.  Skin: Skin is warm. No rash noted. No erythema.  Psychiatric: She has a normal mood and affect.  Well groomed, good eye contact.   ASSESSMENT AND PLAN:  Ms. Cire Deyarmin was seen today for follow-up.  Orders Placed This Encounter  Procedures  . Basic metabolic panel  . Hemoglobin A1c  . Lipid panel  . TSH  Lab Results  Component Value Date   TSH 3.76 06/02/2019   Lab Results  Component Value Date   CREATININE 0.86 06/02/2019   BUN 15 06/02/2019   NA 140 06/02/2019   K 4.4 06/02/2019   CL 105 06/02/2019   CO2 28 06/02/2019   Lab Results  Component Value Date   CHOL 228 (H) 06/02/2019   HDL 57.00 06/02/2019   LDLCALC 152 (H) 06/02/2019   TRIG 94.0 06/02/2019   CHOLHDL 4 06/02/2019   Lab Results  Component Value Date   HGBA1C 5.7 06/02/2019    The 10-year ASCVD risk score Denman George DC Jr., et al., 2013) is: 5.2%   Values used to calculate the score:     Age: 51 years     Sex: Female     Is Non-Hispanic African American: Yes     Diabetic: No     Tobacco smoker: No     Systolic Blood Pressure: 124 mmHg     Is BP treated: No     HDL Cholesterol: 57 mg/dL     Total Cholesterol: 228 mg/dL  1. Class 2 severe obesity due to excess calories with serious comorbidity and body mass index (BMI) of 37.0 to 37.9 in adult [facility]) Gained about 4 Lb since her last visit. We discussed benefits of wt loss as well as adverse effects of obesity. Consistency with healthy diet and physical activity recommended. Calorie count and food diary recommended.  2. Subclinical hypothyroidism Further recommendations according to TSH results.  3. Hypertension, essential, benign BP adequately controlled. Continue non pharmacologic treatment. Recommend monitoring BP at home. Low salt diet.  4. Hyperglycemia Healthy life  style for primary prevention of diabetes. - Hemoglobin A1c  5. Bilateral lower extremity edema This is a chronic problem. I do not think it is a major contributing factor for wt gain. LE elevation and compression stocking recommended. She has also been evaluated by vascular.  6.Screening for lipid disorders - Lipid panel   Return in about 6 months (around 12/02/2019) for cpe.    Skyah Hannon G. [place], MD  [facility]. [place] office.    A few things to remember from today's visit:   Food diary, you can download an appt. Calorie count 1800 cal / day, check labels. Avoid fried foods.  Please be sure medication list is accurate. If a new problem present, please set up appointment sooner than planned today.

## 2019-06-02 NOTE — Patient Instructions (Signed)
A few things to remember from today's visit:   Food diary, you can download an appt. Calorie count 1800 cal / day, check labels. Avoid fried foods.  Please be sure medication list is accurate. If a new problem present, please set up appointment sooner than planned today.

## 2019-06-03 ENCOUNTER — Encounter: Payer: Self-pay | Admitting: Family Medicine

## 2019-06-24 ENCOUNTER — Ambulatory Visit: Payer: Federal, State, Local not specified - PPO | Attending: Internal Medicine

## 2019-06-24 ENCOUNTER — Encounter: Payer: Self-pay | Admitting: Family Medicine

## 2019-06-24 DIAGNOSIS — Z23 Encounter for immunization: Secondary | ICD-10-CM

## 2019-06-24 NOTE — Progress Notes (Signed)
   Covid-19 Vaccination Clinic  Name:  Cheyenne Hancock    MRN: [id number] DOB: 02-16-1960  06/24/2019  [name] was observed post Covid-19 immunization for 15 minutes without incident. She was provided with Vaccine Information Sheet and instruction to access the V-Safe system.   [name] was instructed to call 911 with any severe reactions post vaccine: Marland Kitchen Difficulty breathing  . Swelling of face and throat  . A fast heartbeat  . A bad rash all over body  . Dizziness and weakness   Immunizations Administered    Name Date Dose VIS Date Route   Pfizer COVID-19 Vaccine 06/24/2019 11:13 AM 0.3 mL 03/29/2018 Intramuscular   Manufacturer: [employer], [employer]   Lot: [id number]   NDC: [address]-3

## 2019-07-17 ENCOUNTER — Ambulatory Visit: Payer: Federal, State, Local not specified - PPO | Attending: Internal Medicine

## 2019-07-17 DIAGNOSIS — Z23 Encounter for immunization: Secondary | ICD-10-CM

## 2019-07-17 NOTE — Progress Notes (Signed)
   Covid-19 Vaccination Clinic  Name:  Cheyenne Hancock    MRN: [id number] DOB: 12/06/1960  07/17/2019  Cheyenne Hancock was observed post Covid-19 immunization for 15 minutes without incident. She was provided with Vaccine Information Sheet and instruction to access the V-Safe system.   Cheyenne Hancock was instructed to call 911 with any severe reactions post vaccine: Marland Kitchen Difficulty breathing  . Swelling of face and throat  . A fast heartbeat  . A bad rash all over body  . Dizziness and weakness   Immunizations Administered    Name Date Dose VIS Date Route   Pfizer COVID-19 Vaccine 07/17/2019  1:20 PM 0.3 mL 03/29/2018 Intramuscular   Manufacturer: [employer], [employer]   Lot: [id number]   NDC: [address]-6

## 2019-07-26 DIAGNOSIS — M19072 Primary osteoarthritis, left ankle and foot: Secondary | ICD-10-CM | POA: Diagnosis not present

## 2019-07-26 DIAGNOSIS — X509XXA Other and unspecified overexertion or strenuous movements or postures, initial encounter: Secondary | ICD-10-CM | POA: Diagnosis not present

## 2019-07-26 DIAGNOSIS — M7732 Calcaneal spur, left foot: Secondary | ICD-10-CM | POA: Diagnosis not present

## 2019-07-26 DIAGNOSIS — G8911 Acute pain due to trauma: Secondary | ICD-10-CM | POA: Diagnosis not present

## 2019-07-26 DIAGNOSIS — S99922A Unspecified injury of left foot, initial encounter: Secondary | ICD-10-CM | POA: Diagnosis not present

## 2019-07-26 DIAGNOSIS — S93402A Sprain of unspecified ligament of left ankle, initial encounter: Secondary | ICD-10-CM | POA: Diagnosis not present

## 2020-01-08 ENCOUNTER — Ambulatory Visit (INDEPENDENT_AMBULATORY_CARE_PROVIDER_SITE_OTHER): Payer: Federal, State, Local not specified - PPO | Admitting: Family Medicine

## 2020-01-08 ENCOUNTER — Other Ambulatory Visit: Payer: Self-pay

## 2020-01-08 ENCOUNTER — Encounter: Payer: Self-pay | Admitting: Family Medicine

## 2020-01-08 VITALS — BP 120/74 | HR 96 | Resp 16 | Ht 62.0 in | Wt 220.4 lb

## 2020-01-08 DIAGNOSIS — R5383 Other fatigue: Secondary | ICD-10-CM | POA: Diagnosis not present

## 2020-01-08 DIAGNOSIS — E785 Hyperlipidemia, unspecified: Secondary | ICD-10-CM | POA: Diagnosis not present

## 2020-01-08 DIAGNOSIS — E038 Other specified hypothyroidism: Secondary | ICD-10-CM | POA: Diagnosis not present

## 2020-01-08 DIAGNOSIS — Z0000 Encounter for general adult medical examination without abnormal findings: Secondary | ICD-10-CM | POA: Diagnosis not present

## 2020-01-08 DIAGNOSIS — E538 Deficiency of other specified B group vitamins: Secondary | ICD-10-CM | POA: Diagnosis not present

## 2020-01-08 DIAGNOSIS — Z130 Encounter for screening for diseases of the blood and blood-forming organs and certain disorders involving the immune mechanism: Secondary | ICD-10-CM

## 2020-01-08 DIAGNOSIS — Z780 Asymptomatic menopausal state: Secondary | ICD-10-CM

## 2020-01-08 DIAGNOSIS — I10 Essential (primary) hypertension: Secondary | ICD-10-CM

## 2020-01-08 DIAGNOSIS — Z13228 Encounter for screening for other metabolic disorders: Secondary | ICD-10-CM

## 2020-01-08 DIAGNOSIS — Z1329 Encounter for screening for other suspected endocrine disorder: Secondary | ICD-10-CM

## 2020-01-08 LAB — COMPREHENSIVE METABOLIC PANEL
ALT: 13 U/L (ref 0–35)
AST: 13 U/L (ref 0–37)
Albumin: 3.9 g/dL (ref 3.5–5.2)
Alkaline Phosphatase: 68 U/L (ref 39–117)
BUN: 16 mg/dL (ref 6–23)
CO2: 26 mEq/L (ref 19–32)
Calcium: 9.1 mg/dL (ref 8.4–10.5)
Chloride: 108 mEq/L (ref 96–112)
Creatinine, Ser: 0.86 mg/dL (ref 0.40–1.20)
GFR: 72.88 mL/min (ref >60.00)
Glucose, Bld: 102 mg/dL — ABNORMAL HIGH (ref 70–99)
Potassium: 4.2 mEq/L (ref 3.5–5.1)
Sodium: 141 mEq/L (ref 135–145)
Total Bilirubin: 0.3 mg/dL (ref 0.2–1.2)
Total Protein: 7.1 g/dL (ref 6.0–8.3)

## 2020-01-08 LAB — LIPID PANEL
Cholesterol: 258 mg/dL — ABNORMAL HIGH (ref 0–200)
HDL: 50 mg/dL (ref >39.00)
LDL Cholesterol: 188 mg/dL — ABNORMAL HIGH (ref 0–99)
NonHDL: 208.38
Total CHOL/HDL Ratio: 5
Triglycerides: 104 mg/dL (ref 0.0–149.0)
VLDL: 20.8 mg/dL (ref 0.0–40.0)

## 2020-01-08 LAB — CBC
HCT: 39 % (ref 36.0–46.0)
Hemoglobin: 12.5 g/dL (ref 12.0–15.0)
MCHC: 32 g/dL (ref 30.0–36.0)
MCV: 90.7 fl (ref 78.0–100.0)
Platelets: 242 10*3/uL (ref 150.0–400.0)
RBC: 4.3 Mil/uL (ref 3.87–5.11)
RDW: 14.3 % (ref 11.5–15.5)
WBC: 4.5 10*3/uL (ref 4.0–10.5)

## 2020-01-08 LAB — T4, FREE: Free T4: 0.91 ng/dL (ref 0.60–1.60)

## 2020-01-08 LAB — TSH: TSH: 5.16 u[IU]/mL — ABNORMAL HIGH (ref 0.35–4.50)

## 2020-01-08 LAB — HEMOGLOBIN A1C: Hgb A1c MFr Bld: 5.7 % (ref 4.6–6.5)

## 2020-01-08 LAB — VITAMIN B12: Vitamin B-12: >1526 pg/mL — ABNORMAL HIGH (ref 211–911)

## 2020-01-08 NOTE — Assessment & Plan Note (Signed)
BP is adequately controlled. Continue nonpharmacologic treatment.

## 2020-01-08 NOTE — Assessment & Plan Note (Addendum)
Continue nonpharmacologic treatment. Further recommendation will be given according to TSH results.   She is on biotin supplementation,so we can repeat if abnormal.

## 2020-01-08 NOTE — Progress Notes (Signed)
HPI: Cheyenne Hancock is a 59 y.o. female, who is here today for her routine physical.  Last CPE: 04/14/17  Regular exercise 3 or more time per week: She active at work, heavy lifting at the [employer]. She does not exercise regularly. Following a healthy diet: Vegetables but still "struggling with carbs."Decreased sweets intake. She lives alone. She is involved with church and spent time with her daughter.  Chronic medical problems: HTN,OA,B12 deficiency,HLD,and subclinical hypothyroidism among some.  Pap smear 04/2017 negative for malignancy and HPV. Hx of abnormal pap smears: Negative.  M: 9 LMP 38 G: 2 L:2  Mammogram: 02/15/19.  Colonoscopy: 03/2017. DEXA: 11/2008 Osteopenia. She is on Ca++ and vit D3 supplementation.  Immunization History  Administered Date(s) Administered  . Influenza-Unspecified 12/05/2018  . PFIZER SARS-COV-2 Vaccination 06/24/2019, 07/17/2019  . Tdap 09/10/2011, 04/14/2017   Hep C screening: 04/15/17 NR.  Concerns today: Fatigue: She has felt "slugging" sometimes , alleviated when she takes days off from work.  Hypothyroidism: Not on hormonal replacement.  Lab Results  Component Value Date   TSH 3.76 06/02/2019   She is sleeping about 6 hours, she feels rested. She is taking Mg at night,it helps her to calm down and sleep.  HLD: She is on non pharmacologic treatment.  Lab Results  Component Value Date   CHOL 228 (H) 06/02/2019   HDL 57.00 06/02/2019   LDLCALC 152 (H) 06/02/2019   TRIG 94.0 06/02/2019   CHOLHDL 4 06/02/2019   SL B12 1962 mcg daily.  Lab Results  Component Value Date   VITAMINB12 1,050 (H) 11/18/2018   HTN: She is on non pharmacologic treatment. Negative for unusual/severe headache,CP,or SOB.  Component     Latest Ref Rng & Units 06/02/2019  Sodium     135 - 145 mEq/L 140  Potassium     3.5 - 5.1 mEq/L 4.4  Chloride     96 - 112 mEq/L 105  CO2     19 - 32 mEq/L 28  Glucose     70 - 99  mg/dL 229 (H)  BUN     6 - 23 mg/dL 15  Creatinine     7.98 - 1.20 mg/dL 9.21  Calcium     8.4 - 10.5 mg/dL 9.3  GFR     >19.41 mL/min 81.20    Review of Systems  Constitutional: Positive for fatigue. Negative for appetite change and fever.  HENT: Negative for dental problem, hearing loss, mouth sores and sore throat.   Eyes: Negative for redness and visual disturbance.  Respiratory: Negative for cough and wheezing.   Cardiovascular: Negative for chest pain and leg swelling.  Gastrointestinal: Negative for abdominal pain, nausea and vomiting.       No changes in bowel habits.  Endocrine: Negative for cold intolerance, heat intolerance, polydipsia, polyphagia and polyuria.  Genitourinary: Negative for decreased urine volume, dysuria, hematuria, vaginal bleeding and vaginal discharge.  Musculoskeletal: Negative for gait problem and myalgias.  Skin: Negative for color change and rash.  Allergic/Immunologic: Negative for environmental allergies.  Neurological: Negative for syncope, facial asymmetry and weakness.  Hematological: Negative for adenopathy. Does not bruise/bleed easily.  Psychiatric/Behavioral: Negative for behavioral problems and confusion.  All other systems reviewed and are negative.  No current outpatient medications on file prior to visit.   No current facility-administered medications on file prior to visit.   Past Medical History:  Diagnosis Date  . Chicken pox   . Hypertension    Dx 2009-2010  .  Kidney stones   . Osteopenia    DEXA 11/22/08  . Varicose veins of bilateral lower extremities with pain    Past Surgical History:  Procedure Laterality Date  . TUBAL LIGATION  11/24/1984   No Known Allergies  Family History  Problem Relation Age of Onset  . Arthritis Mother   . Stroke Mother   . Hypertension Mother   . Cancer Sister        breast  . Breast cancer Sister   . Cancer Paternal Uncle        lung and prostate  . Breast cancer Maternal  Grandmother   . Stomach cancer Maternal Aunt   . Colon cancer Neg Hx   . Esophageal cancer Neg Hx   . Liver cancer Neg Hx   . Pancreatic cancer Neg Hx   . Rectal cancer Neg Hx     Social History   Socioeconomic History  . Marital status: Divorced    Spouse name: Not on file  . Number of children: 2  . Years of education: Not on file  . Highest education level: Not on file  Occupational History  . Not on file  Tobacco Use  . Smoking status: Never Smoker  . Smokeless tobacco: Never Used  Vaping Use  . Vaping Use: Never used  Substance and Sexual Activity  . Alcohol use: No  . Drug use: No  . Sexual activity: Not on file  Other Topics Concern  . Not on file  Social History Narrative  . Not on file   Social Determinants of Health   Financial Resource Strain: Not on file  Food Insecurity: Not on file  Transportation Needs: Not on file  Physical Activity: Not on file  Stress: Not on file  Social Connections: Not on file   Vitals:   01/08/20 0658  BP: 120/74  Pulse: 96  Resp: 16  SpO2: 97%   Body mass index is 40.31 kg/m.  Wt Readings from Last 3 Encounters:  01/08/20 220 lb 6 oz (100 kg)  06/02/19 221 lb 2 oz (100.3 kg)  04/07/19 217 lb (98.4 kg)   Physical Exam Vitals and nursing note reviewed.  Constitutional:      General: She is not in acute distress.    Appearance: She is well-developed.  HENT:     Head: Normocephalic and atraumatic.     Right Ear: Hearing, tympanic membrane, ear canal and external ear normal.     Left Ear: Hearing, tympanic membrane, ear canal and external ear normal.     Mouth/Throat:     Mouth: Mucous membranes are moist.     Pharynx: Oropharynx is clear. Uvula midline.  Eyes:     Extraocular Movements: Extraocular movements intact.     Conjunctiva/sclera: Conjunctivae normal.     Pupils: Pupils are equal, round, and reactive to light.  Neck:     Thyroid: No thyromegaly.     Trachea: No tracheal deviation.  Cardiovascular:      Rate and Rhythm: Normal rate and regular rhythm.     Pulses:          Dorsalis pedis pulses are 2+ on the right side and 2+ on the left side.     Heart sounds: No murmur heard.   Pulmonary:     Effort: Pulmonary effort is normal. No respiratory distress.     Breath sounds: Normal breath sounds.  Chest:  Breasts:     Right: No supraclavicular adenopathy.     Left:  No supraclavicular adenopathy.    Abdominal:     Palpations: Abdomen is soft. There is no hepatomegaly or mass.     Tenderness: There is no abdominal tenderness.  Genitourinary:    Comments: Breast: No masses,skin,or skin changes bilateral. Musculoskeletal:     Comments: No signs of synovitis appreciated.  Lymphadenopathy:     Cervical: No cervical adenopathy.     Upper Body:     Right upper body: No supraclavicular adenopathy.     Left upper body: No supraclavicular adenopathy.  Skin:    General: Skin is warm.     Findings: No erythema or rash.  Neurological:     Mental Status: She is alert and oriented to person, place, and time.     Cranial Nerves: No cranial nerve deficit.     Coordination: Coordination normal.     Gait: Gait normal.     Deep Tendon Reflexes:     Reflex Scores:      Bicep reflexes are 2+ on the right side and 2+ on the left side.      Patellar reflexes are 2+ on the right side and 2+ on the left side. Psychiatric:        Speech: Speech normal.     Comments: Well groomed, good eye contact.   ASSESSMENT AND PLAN:  Ms. Taylor Spilde was here today annual physical examination.  Orders Placed This Encounter  Procedures  . DEXAScan  . Comprehensive metabolic panel  . CBC  . Hemoglobin A1c  . Lipid panel  . T4, free  . TSH  . Vitamin B12   Lab Results  Component Value Date   VITAMINB12 >1526 (H) 01/08/2020   Lab Results  Component Value Date   WBC 4.5 01/08/2020   HGB 12.5 01/08/2020   HCT 39.0 01/08/2020   MCV 90.7 01/08/2020   PLT 242.0 01/08/2020   Lab  Results  Component Value Date   CREATININE 0.86 01/08/2020   BUN 16 01/08/2020   NA 141 01/08/2020   K 4.2 01/08/2020   CL 108 01/08/2020   CO2 26 01/08/2020   Lab Results  Component Value Date   ALT 13 01/08/2020   AST 13 01/08/2020   ALKPHOS 68 01/08/2020   BILITOT 0.3 01/08/2020   Lab Results  Component Value Date   HGBA1C 5.7 01/08/2020   Lab Results  Component Value Date   TSH 5.16 (H) 01/08/2020   Lab Results  Component Value Date   CHOL 258 (H) 01/08/2020   HDL 50.00 01/08/2020   LDLCALC 188 (H) 01/08/2020   TRIG 104.0 01/08/2020   CHOLHDL 5 01/08/2020   Routine general medical examination at a health care facility We discussed the importance of regular physical activity and healthy diet for prevention of chronic illness and/or complications. Preventive guidelines reviewed. Vaccination up to date. Declined flu vaccine.  Ca++ and vit D supplementation to continue. Next CPE in a year.  The 10-year ASCVD risk score Denman George DC Montez Hageman., et al., 2013) is: 6.3%   Values used to calculate the score:     Age: 63 years     Sex: Female     Is Non-Hispanic African American: Yes     Diabetic: No     Tobacco smoker: No     Systolic Blood Pressure: 120 mmHg     Is BP treated: No     HDL Cholesterol: 50 mg/dL     Total Cholesterol: 258 mg/dL  Fatigue, unspecified type We discussed possible  etiologies: Systemic illness, immunologic,endocrinology,sleep disorder, psychiatric/psychologic, infectious,medications side effects, and idiopathic. Examination today does not suggest a serious process. Healthy diet and regular physical activity may help.  Further recommendations will be given according to lab results.  Asymptomatic postmenopausal estrogen deficiency -     DEXAScan; Future  Screening for endocrine, metabolic and immunity disorder -     Cancel: COMPLETE METABOLIC PANEL WITH GFR; Future -     Hemoglobin A1c  Subclinical hypothyroidism Continue nonpharmacologic  treatment. Further recommendation will be given according to TSH results.  We may need to repeat lab given the fact she is on biotin.  Hypertension, essential, benign BP is adequately controlled. Continue nonpharmacologic treatment.  Hyperlipidemia Continue nonpharmacologic treatment. Further recommendation will be given according to 10 years CVD risk and lipid panel numbers.  B12 deficiency This could be a contributing factor for fatigue. Continue B12 1000 mcg daily. Further recommendation will be given according to B12 results.   Return in 1 year (on 01/07/2021) for cpe and f/u.  Zyria Fiscus G. [place], MD  [facility]. [place] office.   Today you have you routine preventive visit. A few things to remember from today's visit:   Routine general medical examination at a health care facility  Subclinical hypothyroidism - Plan: TSH, T4, free  Hyperlipidemia, unspecified hyperlipidemia type - Plan: Lipid panel  B12 deficiency - Plan: Vitamin B12  Fatigue, unspecified type - Plan: CBC  Asymptomatic postmenopausal estrogen deficiency - Plan: DEXAScan  Screening for endocrine, metabolic and immunity disorder - Plan: COMPLETE METABOLIC PANEL WITH GFR, Hemoglobin A1c  Please be sure medication list is accurate. If a new problem present, please set up appointment sooner than planned today.  At least 150 minutes of moderate exercise per week, daily brisk walking for 15-30 min is a good exercise option. Healthy diet low in saturated (animal) fats and sweets and consisting of fresh fruits and vegetables, lean meats such as fish and white chicken and whole grains.  These are some of recommendations for screening depending of age and risk factors:  - Vaccines:  Tdap vaccine every 10 years.  Shingles vaccine recommended at age 22, could be given after 61 years of age but not sure about insurance coverage.   Pneumonia vaccines: Pneumovax at 65. Sometimes Pneumovax is  giving earlier if history of smoking, lung disease,diabetes,kidney disease among some.  Screening for diabetes at age 4 and every 3 years.  Cervical cancer prevention:  Pap smear starts at 61 years of age and continues periodically until 61 years old in low risk women. Pap smear every 3 years between 90 and 65 years old. Pap smear every 3-5 years between women 30 and older if pap smear negative and HPV screening negative.  -Breast cancer: Mammogram: There is disagreement between experts about when to start screening in low risk asymptomatic female but recent recommendations are to start screening at 75 and not later than 61 years old , every 1-2 years and after 61 yo q 2 years. Screening is recommended until 61 years old but some women can continue screening depending of healthy issues.  Colon cancer screening: Has been recently changed to 61 yo. Insurance may not cover until you are 61 years old. Screening is recommended until 62 years old.  Cholesterol disorder screening at age 69 and every 3 years. N/A  Also recommended:  1. Dental visit- Brush and floss your teeth twice daily; visit your dentist twice a year. 2. Eye doctor- Get an eye exam at least  every 2 years. 3. Helmet use- Always wear a helmet when riding a bicycle, motorcycle, rollerblading or skateboarding. 4. Safe sex- If you may be exposed to sexually transmitted infections, use a condom. 5. Seat belts- Seat belts can save your live; always wear one. 6. Smoke/Carbon Monoxide detectors- These detectors need to be installed on the appropriate level of your home. Replace batteries at least once a year. 7. Skin cancer- When out in the sun please cover up and use sunscreen 15 SPF or higher. 8. Violence- If anyone is threatening or hurting you, please tell your healthcare provider.  9. Drink alcohol in moderation- Limit alcohol intake to one drink or less per day. Never drink and drive. 10. Calcium supplementation 1000 to 1200 mg  daily, ideally through your diet.  Vitamin D supplementation 800 units daily.

## 2020-01-08 NOTE — Assessment & Plan Note (Signed)
Continue nonpharmacologic treatment. Further recommendation will be given according to 10 years CVD risk and lipid panel numbers. 

## 2020-01-08 NOTE — Patient Instructions (Addendum)
Today you have you routine preventive visit. A few things to remember from today's visit:   Routine general medical examination at a health care facility  Subclinical hypothyroidism - Plan: TSH, T4, free  Hyperlipidemia, unspecified hyperlipidemia type - Plan: Lipid panel  B12 deficiency - Plan: Vitamin B12  Fatigue, unspecified type - Plan: CBC  Asymptomatic postmenopausal estrogen deficiency - Plan: DEXAScan  Screening for endocrine, metabolic and immunity disorder - Plan: COMPLETE METABOLIC PANEL WITH GFR, Hemoglobin A1c  Please be sure medication list is accurate. If a new problem present, please set up appointment sooner than planned today.  At least 150 minutes of moderate exercise per week, daily brisk walking for 15-30 min is a good exercise option. Healthy diet low in saturated (animal) fats and sweets and consisting of fresh fruits and vegetables, lean meats such as fish and white chicken and whole grains.  These are some of recommendations for screening depending of age and risk factors:  - Vaccines:  Tdap vaccine every 10 years.  Shingles vaccine recommended at age 88, could be given after 61 years of age but not sure about insurance coverage.   Pneumonia vaccines: Pneumovax at 65. Sometimes Pneumovax is giving earlier if history of smoking, lung disease,diabetes,kidney disease among some.  Screening for diabetes at age 71 and every 3 years.  Cervical cancer prevention:  Pap smear starts at 61 years of age and continues periodically until 61 years old in low risk women. Pap smear every 3 years between 87 and 10 years old. Pap smear every 3-5 years between women 30 and older if pap smear negative and HPV screening negative.  -Breast cancer: Mammogram: There is disagreement between experts about when to start screening in low risk asymptomatic female but recent recommendations are to start screening at 64 and not later than 61 years old , every 1-2 years and after  61 yo q 2 years. Screening is recommended until 61 years old but some women can continue screening depending of healthy issues.  Colon cancer screening: Has been recently changed to 61 yo. Insurance may not cover until you are 61 years old. Screening is recommended until 61 years old.  Cholesterol disorder screening at age 80 and every 3 years. N/A  Also recommended:  1. Dental visit- Brush and floss your teeth twice daily; visit your dentist twice a year. 2. Eye doctor- Get an eye exam at least every 2 years. 3. Helmet use- Always wear a helmet when riding a bicycle, motorcycle, rollerblading or skateboarding. 4. Safe sex- If you may be exposed to sexually transmitted infections, use a condom. 5. Seat belts- Seat belts can save your live; always wear one. 6. Smoke/Carbon Monoxide detectors- These detectors need to be installed on the appropriate level of your home. Replace batteries at least once a year. 7. Skin cancer- When out in the sun please cover up and use sunscreen 15 SPF or higher. 8. Violence- If anyone is threatening or hurting you, please tell your healthcare provider.  9. Drink alcohol in moderation- Limit alcohol intake to one drink or less per day. Never drink and drive. 10. Calcium supplementation 1000 to 1200 mg daily, ideally through your diet.  Vitamin D supplementation 800 units daily.

## 2020-01-08 NOTE — Assessment & Plan Note (Signed)
This could be a contributing factor for fatigue. Continue B12 1000 mcg daily. Further recommendation will be given according to B12 results.

## 2020-01-17 ENCOUNTER — Telehealth: Payer: Self-pay | Admitting: Family Medicine

## 2020-01-17 NOTE — Telephone Encounter (Signed)
error 

## 2020-02-02 DIAGNOSIS — N390 Urinary tract infection, site not specified: Secondary | ICD-10-CM | POA: Diagnosis not present

## 2020-02-02 DIAGNOSIS — R319 Hematuria, unspecified: Secondary | ICD-10-CM | POA: Diagnosis not present

## 2020-02-02 DIAGNOSIS — R350 Frequency of micturition: Secondary | ICD-10-CM | POA: Diagnosis not present

## 2020-02-02 DIAGNOSIS — R309 Painful micturition, unspecified: Secondary | ICD-10-CM | POA: Diagnosis not present

## 2020-02-02 DIAGNOSIS — Z79899 Other long term (current) drug therapy: Secondary | ICD-10-CM | POA: Diagnosis not present

## 2020-02-12 ENCOUNTER — Other Ambulatory Visit: Payer: Self-pay

## 2020-02-12 ENCOUNTER — Ambulatory Visit (INDEPENDENT_AMBULATORY_CARE_PROVIDER_SITE_OTHER)
Admission: RE | Admit: 2020-02-12 | Discharge: 2020-02-12 | Disposition: A | Discharge status: Home / Self Care | Payer: Federal, State, Local not specified - PPO | Source: Ambulatory Visit

## 2020-02-12 ENCOUNTER — Other Ambulatory Visit (HOSPITAL_BASED_OUTPATIENT_CLINIC_OR_DEPARTMENT_OTHER): Payer: Federal, State, Local not specified - PPO

## 2020-02-12 ENCOUNTER — Inpatient Hospital Stay (HOSPITAL_BASED_OUTPATIENT_CLINIC_OR_DEPARTMENT_OTHER): Admission: RE | Admit: 2020-02-12 | Payer: Federal, State, Local not specified - PPO | Source: Ambulatory Visit

## 2020-02-12 DIAGNOSIS — Z780 Asymptomatic menopausal state: Secondary | ICD-10-CM

## 2020-02-15 DIAGNOSIS — Z780 Asymptomatic menopausal state: Secondary | ICD-10-CM | POA: Diagnosis not present

## 2020-02-29 NOTE — Addendum Note (Signed)
Addended by: Lerry Liner on: 02/29/2020 03:44 PM   Modules accepted: Orders

## 2020-03-11 ENCOUNTER — Other Ambulatory Visit: Payer: Federal, State, Local not specified - PPO

## 2020-04-01 ENCOUNTER — Other Ambulatory Visit (INDEPENDENT_AMBULATORY_CARE_PROVIDER_SITE_OTHER): Payer: Federal, State, Local not specified - PPO

## 2020-04-01 ENCOUNTER — Other Ambulatory Visit: Payer: Self-pay

## 2020-04-01 DIAGNOSIS — E038 Other specified hypothyroidism: Secondary | ICD-10-CM | POA: Diagnosis not present

## 2020-04-01 LAB — TSH: TSH: 3.44 u[IU]/mL (ref 0.35–4.50)

## 2020-05-28 DIAGNOSIS — M25531 Pain in right wrist: Secondary | ICD-10-CM | POA: Diagnosis not present

## 2020-05-28 DIAGNOSIS — I10 Essential (primary) hypertension: Secondary | ICD-10-CM | POA: Diagnosis not present

## 2020-08-12 ENCOUNTER — Other Ambulatory Visit: Payer: Self-pay

## 2020-08-13 ENCOUNTER — Ambulatory Visit: Payer: Federal, State, Local not specified - PPO | Admitting: Family Medicine

## 2020-08-13 ENCOUNTER — Encounter: Payer: Self-pay | Admitting: Family Medicine

## 2020-08-13 VITALS — BP 130/78 | HR 63 | Resp 16 | Ht 62.0 in | Wt 206.1 lb

## 2020-08-13 DIAGNOSIS — M25531 Pain in right wrist: Secondary | ICD-10-CM

## 2020-08-13 DIAGNOSIS — I10 Essential (primary) hypertension: Secondary | ICD-10-CM

## 2020-08-13 MED ORDER — MELOXICAM 7.5 MG PO TBDP: 1.0000 | ORAL_TABLET | Freq: Every day | ORAL | 0 refills | Status: DC

## 2020-08-13 NOTE — Progress Notes (Signed)
Chief Complaint  Patient presents with   pain in wrist    Leading down to arms, right wrist. Seen in ER back in April, did x-ray, think it may be carpal tunnel. Directed pt to follow up with pcp if pain did not improve.    HPI: Cheyenne Hancock is a 60 y.o.  right handed female, who is here today complaining of persistent right wrist pain as described above. She has had problem for at least 3 months. No hx of trauma but she does a lot of lifting at work. She has been wearing a wrist splint, which has helped some but pain is still constant sharp pain, 7/10 now but can be 10/10. Pain localized on ulnar/dorsal area,radiated to dorsum of hand and sometimes to forearm. No numbness or tingling. Mild edema. + Limitation of ROM.  Exacerbated by movement and alleviated by test. She is using topical OTC medications.  HTN on non pharmacologic treatment. No hx of CKD or gout. Lab Results  Component Value Date   CREATININE 0.86 01/08/2020   BUN 16 01/08/2020   NA 141 01/08/2020   K 4.2 01/08/2020   CL 108 01/08/2020   CO2 26 01/08/2020   Review of Systems  Constitutional:  Positive for activity change. Negative for chills and fever.  Respiratory:  Negative for cough, shortness of breath and wheezing.   Cardiovascular:  Negative for chest pain, palpitations and leg swelling.  Gastrointestinal:  Negative for abdominal pain, nausea and vomiting.  Musculoskeletal:  Negative for gait problem and myalgias.  Skin:  Negative for color change and pallor.  Neurological:  Negative for weakness, numbness and headaches.  Rest see pertinent positives and negatives per HPI.  No current outpatient medications on file prior to visit.   No current facility-administered medications on file prior to visit.   Past Medical History:  Diagnosis Date   Chicken pox    Hypertension    Dx 2009-2010   Kidney stones    Osteopenia    DEXA 11/22/08   Varicose veins of bilateral lower  extremities with pain    No Known Allergies  Social History   Socioeconomic History   Marital status: Divorced    Spouse name: Not on file   Number of children: 2   Years of education: Not on file   Highest education level: Not on file  Occupational History   Not on file  Tobacco Use   Smoking status: Never   Smokeless tobacco: Never  Vaping Use   Vaping Use: Never used  Substance and Sexual Activity   Alcohol use: No   Drug use: No   Sexual activity: Not on file  Other Topics Concern   Not on file  Social History Narrative   Not on file   Social Determinants of Health   Financial Resource Strain: Not on file  Food Insecurity: Not on file  Transportation Needs: Not on file  Physical Activity: Not on file  Stress: Not on file  Social Connections: Not on file    Vitals:   08/13/20 1528  BP: 130/78  Pulse: 63  Resp: 16  SpO2: 99%   Body mass index is 37.7 kg/m.  Physical Exam Vitals and nursing note reviewed.  Constitutional:      General: She is not in acute distress.    Appearance: She is well-developed. She is not ill-appearing.  HENT:     Head: Normocephalic and atraumatic.  Eyes:     Conjunctiva/sclera: Conjunctivae normal.  Cardiovascular:     Rate and Rhythm: Normal rate and regular rhythm.     Heart sounds: No murmur heard. Pulmonary:     Effort: Pulmonary effort is normal. No respiratory distress.     Breath sounds: Normal breath sounds.  Musculoskeletal:     Right wrist: Swelling (Minimal.) and tenderness (Upon palpation of ulnar styloid.) present. No deformity, snuff box tenderness or crepitus. Decreased range of motion (Mild, flexion.). Normal pulse.     Comments: Movement elicits pain. Tinel and Phalen negative.  Skin:    General: Skin is warm.     Findings: No erythema or rash.  Neurological:     General: No focal deficit present.     Mental Status: She is alert and oriented to person, place, and time.   ASSESSMENT AND  PLAN:  [name] was seen today for pain in wrist.  Diagnoses and all orders for this visit:  Right wrist pain We discussed possible etiologies, ?tenosynovitis,OA. Problem has not improved, so ortho evaluation is warrant. Meloxicam x 7-10 days. Continue wrist splint.  -     Meloxicam 7.5 MG TBDP; Take 1-2 tablets by mouth daily.  Hypertension, essential, benign BP adequately controlled. Some side effects of NSAID's discussed. Recommend monitoring BP regularly.   Return if symptoms worsen or fail to improve.   Brinae Woods G. [place], MD  [facility]. [place] office.

## 2020-08-13 NOTE — Patient Instructions (Addendum)
A few things to remember from today's visit:  Right wrist pain - Plan: Meloxicam 7.5 MG TBDP, Ambulatory referral to Orthopedic Surgery  Hypertension, essential, benign  If you need refills please call your pharmacy. Do not use My Chart to request refills or for acute issues that need immediate attention.   Take Meloxicam 1-2 times per day. Ortho appt is going to be arranged. Take blood pressure regularly.  Please be sure medication list is accurate. If a new problem present, please set up appointment sooner than planned today.

## 2020-08-16 MED ORDER — MELOXICAM 7.5 MG PO TABS: 7.5000 mg | ORAL_TABLET | Freq: Every day | ORAL | 0 refills | Status: AC

## 2020-08-16 NOTE — Addendum Note (Signed)
Addended by: [place], Mikalyn Hermida G on: 08/16/2020 05:09 PM   Modules accepted: Orders

## 2020-08-19 IMAGING — MR MR LUMBAR SPINE W/O CM
4 of 5 series · 25 of 48 positions shown · non-contrast
Comparison: Plain films lumbar spine 07/22/2017.

CLINICAL DATA: Worsening low back pain for 2 years radiating into
the right leg. Numbness in the lateral aspect of the right calf.

EXAM:
MRI LUMBAR SPINE WITHOUT CONTRAST
TECHNIQUE: Multiplanar, multisequence MR imaging of the lumbar spine was
performed. No intravenous contrast was administered.

[Series 2: T2 · sagittal · 4.0mm · 1.09mm/px · 6 of 16 slices shown (1 of 2)]
[im 1/16]
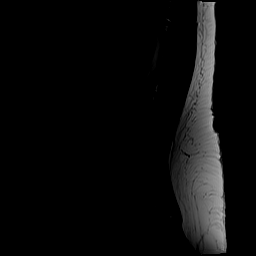
[im 4/16]
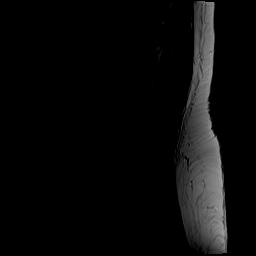
[im 7/16]
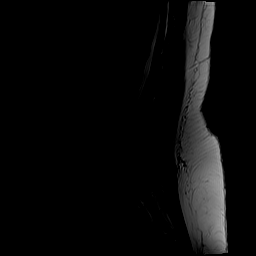
[im 10/16]
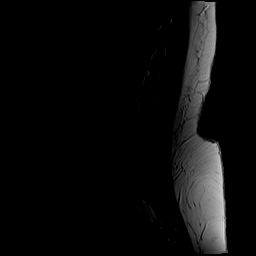
[im 13/16]
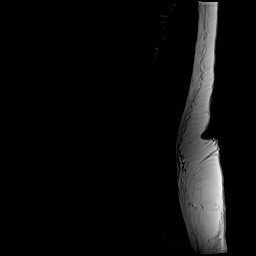
[im 16/16]
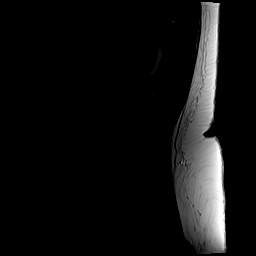

[Series 5: T2 · axial · 4.0mm · 0.39mm/px · z∈[-67,+133]mm · 9 of 38 slices shown (2 of 2)]
[im 1/38]
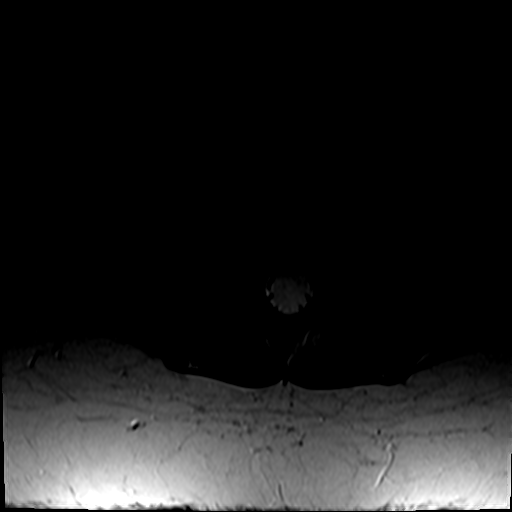
[im 6/38]
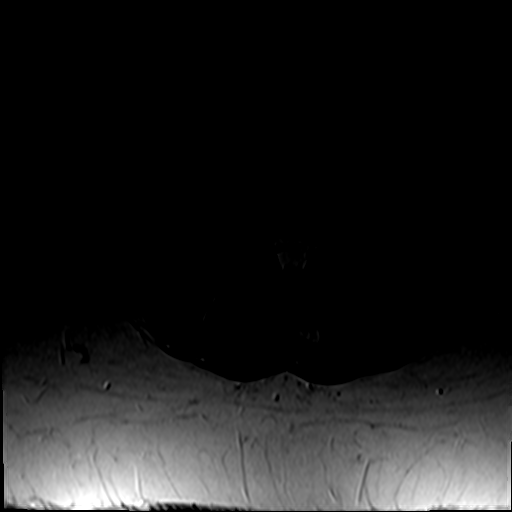
[im 11/38]
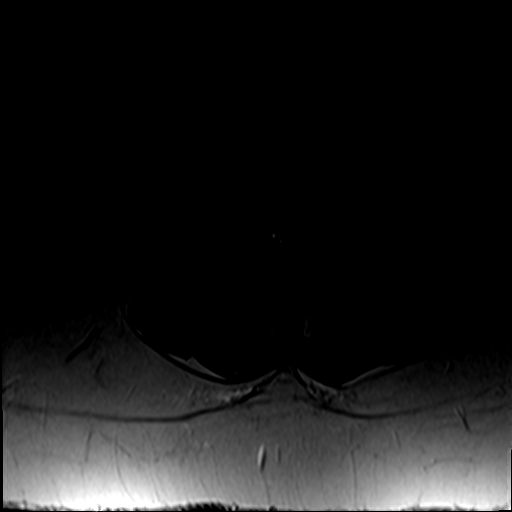
[im 16/38]
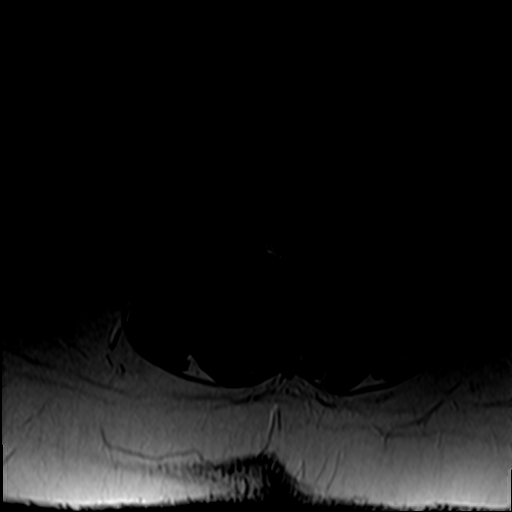
[im 19/38]
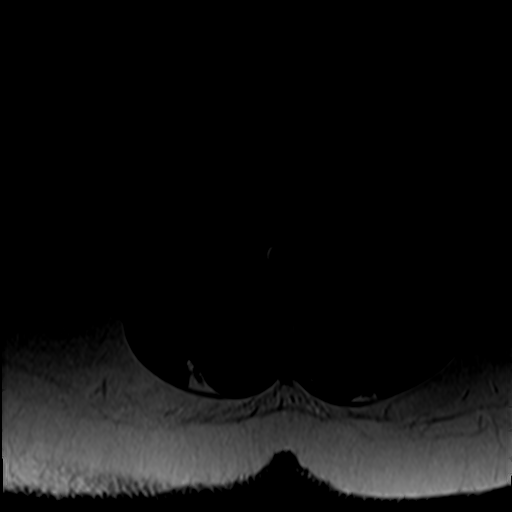
[im 22/38]
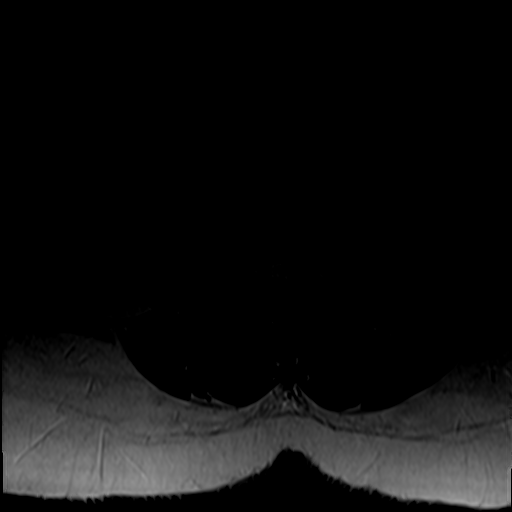
[im 27/38]
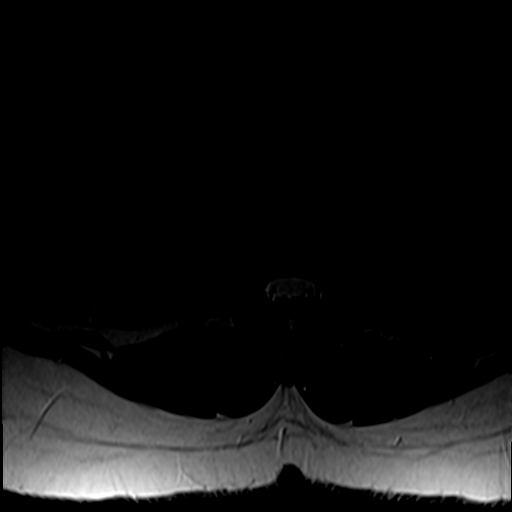
[im 32/38]
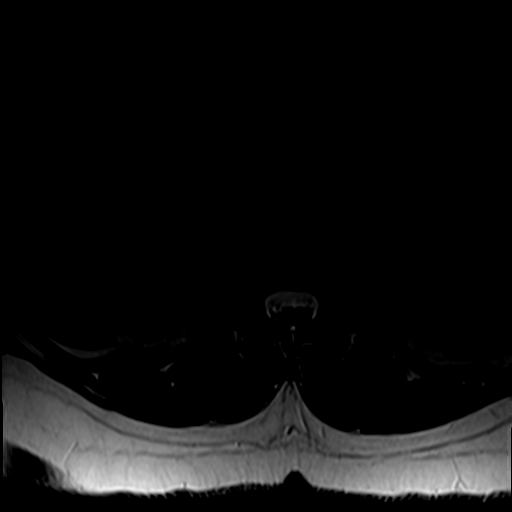
[im 38/38]
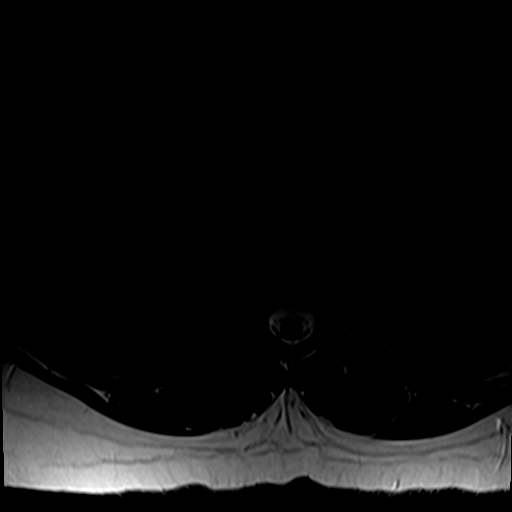

[Series 6: T1 · axial · 4.0mm · 0.39mm/px · z∈[-67,+104]mm · 7 of 38 slices shown (1 of 2)]
[im 1/38]
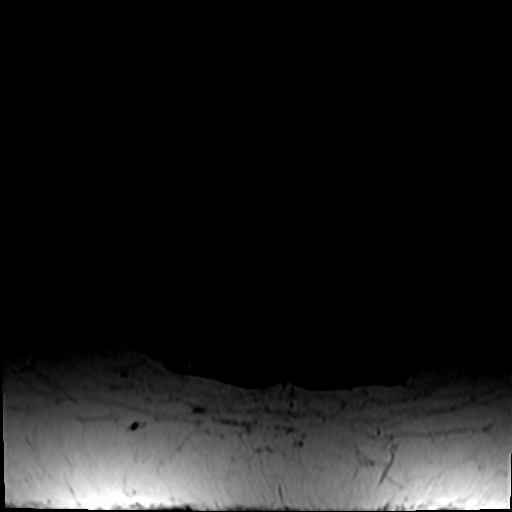
[im 6/38]
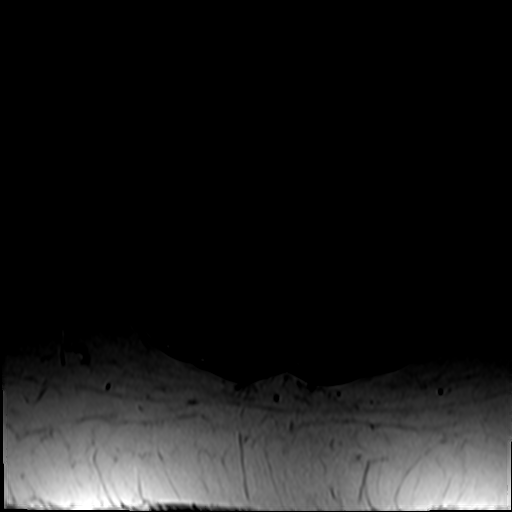
[im 11/38]
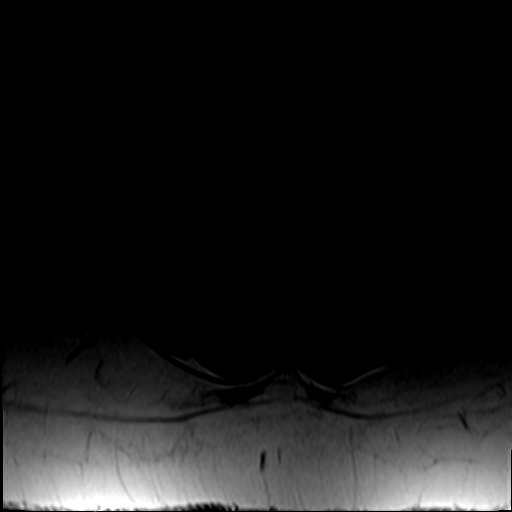
[im 16/38]
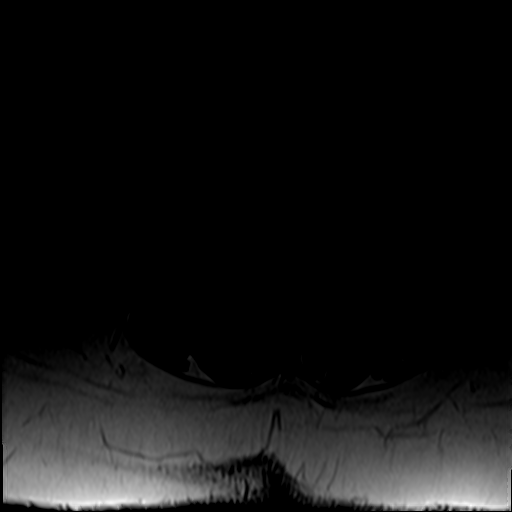
[im 19/38]
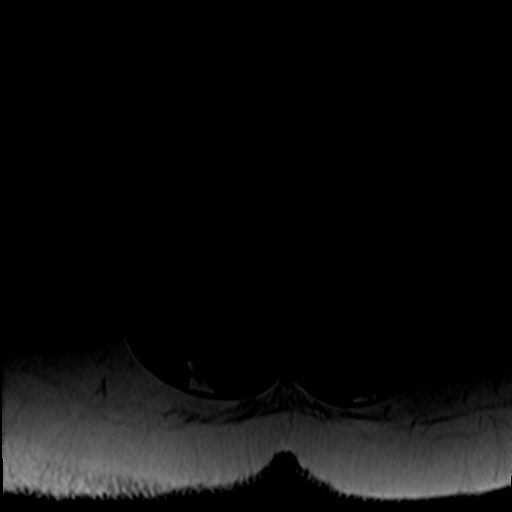
[im 22/38]
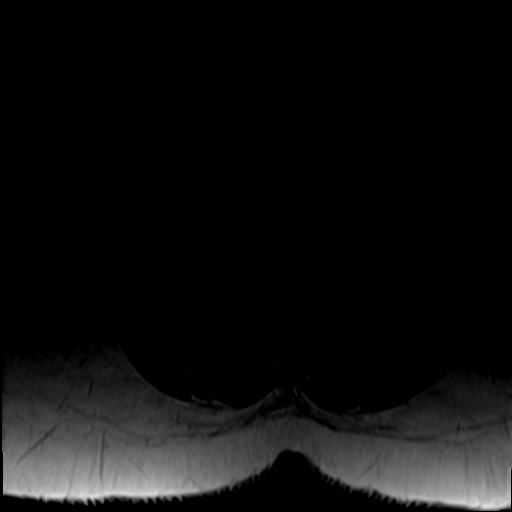
[im 32/38]
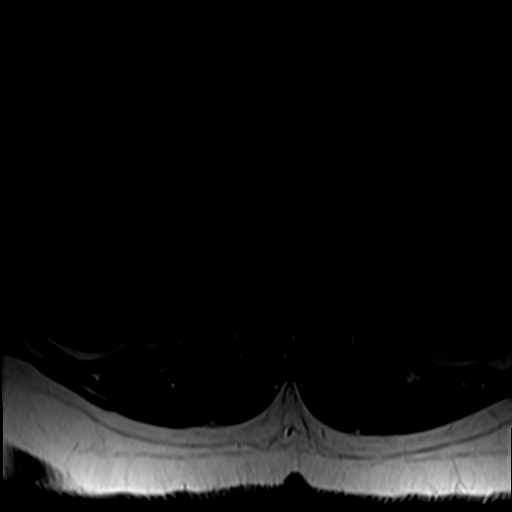

[Series 7: T1 · sagittal · 4.0mm · 1.09mm/px · 3 of 16 slices shown (2 of 2)]
[im 4/16]
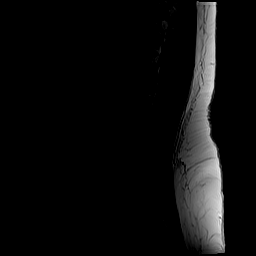
[im 10/16]
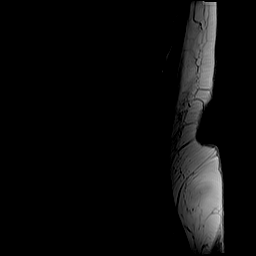
[im 16/16]
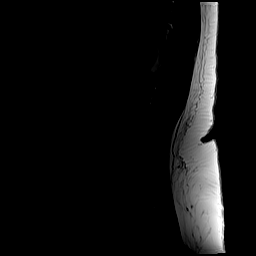

[25 of 48 positions shown; findings below may reference images not displayed]

FINDINGS: Segmentation:  Standard.

Alignment: Normal.

Vertebrae:  No fracture, evidence of discitis, or bone lesion.

Conus medullaris and cauda equina: Conus extends to the L1-2 level.
Conus and cauda equina appear normal.

Paraspinal and other soft tissues: Left renal cyst noted.

Disc levels:

T11-12 is imaged in the sagittal plane only and negative.

T12-L1: Negative.

L1-2: Negative.

L2-3: Mild facet degenerative change.  Otherwise negative.

L3-4: Moderate facet arthropathy and a shallow disc bulge. No
stenosis.

L4-5: Moderate facet arthropathy and a shallow disc bulge. No
stenosis.

L5-S1: Mild-to-moderate facet degenerative change. Minimal disc
bulge. No stenosis.
IMPRESSION: Lower lumbar facet arthropathy and mild lower lumbar degenerative
disc disease. The central canal and foramina are open at all levels.

## 2020-08-26 DIAGNOSIS — S63591A Other specified sprain of right wrist, initial encounter: Secondary | ICD-10-CM | POA: Diagnosis not present

## 2020-09-04 DIAGNOSIS — M25531 Pain in right wrist: Secondary | ICD-10-CM | POA: Diagnosis not present

## 2021-12-23 ENCOUNTER — Telehealth: Payer: Self-pay | Admitting: *Deleted

## 2021-12-23 NOTE — Patient Outreach (Signed)
  Care Coordination   12/23/2021 Name: Cheyenne Hancock MRN: [id number] DOB: 1960-07-19   Care Coordination Outreach Attempts:  An unsuccessful telephone outreach was attempted today to offer the patient information about available care coordination services as a benefit of their health plan.   Follow Up Plan:  Additional outreach attempts will be made to offer the patient care coordination information and services.   Encounter Outcome:  No Answer  Care Coordination Interventions Activated:  No   Care Coordination Interventions:  No, not indicated    Elliot Cousin, RN Care Management Coordinator [facility] Main Office [phone]

## 2021-12-31 DIAGNOSIS — M7731 Calcaneal spur, right foot: Secondary | ICD-10-CM | POA: Diagnosis not present

## 2021-12-31 DIAGNOSIS — R200 Anesthesia of skin: Secondary | ICD-10-CM | POA: Diagnosis not present

## 2021-12-31 DIAGNOSIS — M25571 Pain in right ankle and joints of right foot: Secondary | ICD-10-CM | POA: Diagnosis not present

## 2022-03-18 ENCOUNTER — Other Ambulatory Visit: Payer: Self-pay | Admitting: Family Medicine

## 2022-03-18 DIAGNOSIS — Z1231 Encounter for screening mammogram for malignant neoplasm of breast: Secondary | ICD-10-CM

## 2022-03-20 NOTE — Progress Notes (Unsigned)
HPI: Cheyenne Hancock is a 62 y.o. female, who is here today for her routine physical.  Last CPE: 01/08/20  Regular exercise 3 or more time per week: *** Following a healthy diet: ***  Chronic medical problems: ***  Immunization History  Administered Date(s) Administered  . Influenza-Unspecified 12/05/2018  . PFIZER(Purple Top)SARS-COV-2 Vaccination 06/24/2019, 07/17/2019  . Tdap 09/10/2011, 04/14/2017   Health Maintenance  Topic Date Due  . HIV Screening  Never done  . Zoster Vaccines- Shingrix (1 of 2) Never done  . PAP SMEAR-Modifier  04/14/2020  . MAMMOGRAM  02/14/2021  . COVID-19 Vaccine (3 - 2023-24 season) 10/03/2021  . INFLUENZA VACCINE  05/03/2022 (Originally 09/02/2021)  . COLONOSCOPY (Pts 45-45yr Insurance coverage will need to be confirmed)  03/16/2027  . DTaP/Tdap/Td (3 - Td or Tdap) 04/15/2027  . Hepatitis C Screening  Completed  . HPV VACCINES  Aged Out    She has *** concerns today.  Review of Systems  No current outpatient medications on file prior to visit.   No current facility-administered medications on file prior to visit.    Past Medical History:  Diagnosis Date  . Chicken pox   . Hypertension    Dx 2009-2010  . Kidney stones   . Osteopenia    DEXA 11/22/08  . Varicose veins of bilateral lower extremities with pain     Past Surgical History:  Procedure Laterality Date  . TUBAL LIGATION  11/24/1984    No Known Allergies  Family History  Problem Relation Age of Onset  . Arthritis Mother   . Stroke Mother   . Hypertension Mother   . Cancer Sister        breast  . Breast cancer Sister   . Cancer Paternal Uncle        lung and prostate  . Breast cancer Maternal Grandmother   . Stomach cancer Maternal Aunt   . Colon cancer Neg Hx   . Esophageal cancer Neg Hx   . Liver cancer Neg Hx   . Pancreatic cancer Neg Hx   . Rectal cancer Neg Hx     Social History   Socioeconomic History  . Marital status: Divorced     Spouse name: Not on file  . Number of children: 2  . Years of education: Not on file  . Highest education level: Not on file  Occupational History  . Not on file  Tobacco Use  . Smoking status: Never  . Smokeless tobacco: Never  Vaping Use  . Vaping Use: Never used  Substance and Sexual Activity  . Alcohol use: No  . Drug use: No  . Sexual activity: Not on file  Other Topics Concern  . Not on file  Social History Narrative  . Not on file   Social Determinants of Health   Financial Resource Strain: Not on file  Food Insecurity: Not on file  Transportation Needs: Not on file  Physical Activity: Not on file  Stress: Not on file  Social Connections: Not on file    There were no vitals filed for this visit. There is no height or weight on file to calculate BMI.  Wt Readings from Last 3 Encounters:  08/13/20 206 lb 2 oz (93.5 kg)  01/08/20 220 lb 6 oz (100 kg)  06/02/19 221 lb 2 oz (100.3 kg)    Physical Exam Vitals and nursing note reviewed.  Constitutional:      General: She is not in acute distress.  Appearance: She is well-developed.  HENT:     Head: Normocephalic and atraumatic.     Right Ear: Hearing, tympanic membrane, ear canal and external ear normal.     Left Ear: Hearing, tympanic membrane, ear canal and external ear normal.     Mouth/Throat:     Mouth: Mucous membranes are moist.     Pharynx: Oropharynx is clear. Uvula midline.  Eyes:     Extraocular Movements: Extraocular movements intact.     Conjunctiva/sclera: Conjunctivae normal.     Pupils: Pupils are equal, round, and reactive to light.  Neck:     Thyroid: No thyromegaly.     Trachea: No tracheal deviation.  Cardiovascular:     Rate and Rhythm: Normal rate and regular rhythm.     Pulses:          Dorsalis pedis pulses are 2+ on the right side and 2+ on the left side.       Posterior tibial pulses are 2+ on the right side and 2+ on the left side.     Heart sounds: No murmur  heard. Pulmonary:     Effort: Pulmonary effort is normal. No respiratory distress.     Breath sounds: Normal breath sounds.  Abdominal:     Palpations: Abdomen is soft. There is no hepatomegaly or mass.     Tenderness: There is no abdominal tenderness.  Genitourinary:    Comments: Deferred to gyn. Musculoskeletal:     Comments: No major deformity or signs of synovitis appreciated.  Lymphadenopathy:     Cervical: No cervical adenopathy.     Upper Body:     Right upper body: No supraclavicular adenopathy.     Left upper body: No supraclavicular adenopathy.  Skin:    General: Skin is warm.     Findings: No erythema or rash.  Neurological:     General: No focal deficit present.     Mental Status: She is alert and oriented to person, place, and time.     Cranial Nerves: No cranial nerve deficit.     Coordination: Coordination normal.     Gait: Gait normal.     Deep Tendon Reflexes:     Reflex Scores:      Bicep reflexes are 2+ on the right side and 2+ on the left side.      Patellar reflexes are 2+ on the right side and 2+ on the left side. Psychiatric:     Comments: Well groomed, good eye contact.   ASSESSMENT AND PLAN: Cheyenne Hancock was here today annual physical examination.  No orders of the defined types were placed in this encounter.   There are no diagnoses linked to this encounter.  There are no diagnoses linked to this encounter.  No follow-ups on file.  Cheyenne Renken G. [place], MD  [facility]. [place] office.

## 2022-03-23 ENCOUNTER — Encounter: Payer: Self-pay | Admitting: Family Medicine

## 2022-03-23 ENCOUNTER — Other Ambulatory Visit (HOSPITAL_COMMUNITY)
Admission: RE | Admit: 2022-03-23 | Discharge: 2022-03-23 | Disposition: A | Discharge status: Home / Self Care | Payer: Federal, State, Local not specified - PPO | Source: Ambulatory Visit | Attending: Family Medicine | Admitting: Family Medicine

## 2022-03-23 ENCOUNTER — Ambulatory Visit (INDEPENDENT_AMBULATORY_CARE_PROVIDER_SITE_OTHER): Payer: Federal, State, Local not specified - PPO | Admitting: Family Medicine

## 2022-03-23 VITALS — BP 118/64 | HR 91 | Temp 98.1°F | Resp 16 | Ht 62.0 in | Wt 209.2 lb

## 2022-03-23 DIAGNOSIS — I10 Essential (primary) hypertension: Secondary | ICD-10-CM

## 2022-03-23 DIAGNOSIS — Z124 Encounter for screening for malignant neoplasm of cervix: Secondary | ICD-10-CM | POA: Diagnosis not present

## 2022-03-23 DIAGNOSIS — Z131 Encounter for screening for diabetes mellitus: Secondary | ICD-10-CM

## 2022-03-23 DIAGNOSIS — Z0000 Encounter for general adult medical examination without abnormal findings: Secondary | ICD-10-CM | POA: Diagnosis not present

## 2022-03-23 DIAGNOSIS — Z23 Encounter for immunization: Secondary | ICD-10-CM | POA: Diagnosis not present

## 2022-03-23 DIAGNOSIS — E785 Hyperlipidemia, unspecified: Secondary | ICD-10-CM | POA: Diagnosis not present

## 2022-03-23 DIAGNOSIS — Z6837 Body mass index (BMI) 37.0-37.9, adult: Secondary | ICD-10-CM

## 2022-03-23 DIAGNOSIS — Z13220 Encounter for screening for lipoid disorders: Secondary | ICD-10-CM

## 2022-03-23 DIAGNOSIS — E038 Other specified hypothyroidism: Secondary | ICD-10-CM

## 2022-03-23 NOTE — Assessment & Plan Note (Signed)
Continue nonpharmacologic treatment. Further recommendation will be given according to TSH results.

## 2022-03-23 NOTE — Assessment & Plan Note (Signed)
Non pharmacologic treatment recommended for now. Further recommendations will be given according to 10 years CVD risk score and lipid panel numbers. 

## 2022-03-23 NOTE — Assessment & Plan Note (Signed)
We discussed the importance of regular physical activity and healthy diet for prevention of chronic illness and/or complications. Preventive guidelines reviewed. Vaccination updated. Pap smear collected today. Ca++ and vit D supplementation to continue. Next CPE in a year.

## 2022-03-23 NOTE — Assessment & Plan Note (Signed)
BP adequately controlled. Continue nonpharmacologic treatment.

## 2022-03-23 NOTE — Assessment & Plan Note (Signed)
She understands the benefits of wt loss as well as adverse effects of obesity. Consistency with healthy diet and physical activity encouraged. She would like a referral to establish with weight loss clinic.

## 2022-03-23 NOTE — Patient Instructions (Addendum)
A few things to remember from today's visit:  Routine general medical examination at a health care facility  Hypertension, essential, benign  Hyperlipidemia, unspecified hyperlipidemia type  Screening for cervical cancer  Cervical cancer screening - Plan: PAP [[place]]  Class 2 severe obesity due to excess calories with serious comorbidity and body mass index (BMI) of 37.0 to 37.9 in adult [facility]) - Plan: Amb Ref to Medical Weight Management  If you need refills for medications you take chronically, please call your pharmacy. Do not use My Chart to request refills or for acute issues that need immediate attention. If you send a my chart message, it may take a few days to be addressed, specially if I am not in the office.  Please be sure medication list is accurate. If a new problem present, please set up appointment sooner than planned today.  Health Maintenance, Female Adopting a healthy lifestyle and getting preventive care are important in promoting health and wellness. Ask your health care provider about: The right schedule for you to have regular tests and exams. Things you can do on your own to prevent diseases and keep yourself healthy. What should I know about diet, weight, and exercise? Eat a healthy diet  Eat a diet that includes plenty of vegetables, fruits, low-fat dairy products, and lean protein. Do not eat a lot of foods that are high in solid fats, added sugars, or sodium. Maintain a healthy weight Body mass index (BMI) is used to identify weight problems. It estimates body fat based on height and weight. Your health care provider can help determine your BMI and help you achieve or maintain a healthy weight. Get regular exercise Get regular exercise. This is one of the most important things you can do for your health. Most adults should: Exercise for at least 150 minutes each week. The exercise should increase your heart rate and make you sweat (moderate-intensity  exercise). Do strengthening exercises at least twice a week. This is in addition to the moderate-intensity exercise. Spend less time sitting. Even light physical activity can be beneficial. Watch cholesterol and blood lipids Have your blood tested for lipids and cholesterol at 64 years of age, then have this test every 5 years. Have your cholesterol levels checked more often if: Your lipid or cholesterol levels are high. You are older than 64 years of age. You are at high risk for heart disease. What should I know about cancer screening? Depending on your health history and family history, you may need to have cancer screening at various ages. This may include screening for: Breast cancer. Cervical cancer. Colorectal cancer. Skin cancer. Lung cancer. What should I know about heart disease, diabetes, and high blood pressure? Blood pressure and heart disease High blood pressure causes heart disease and increases the risk of stroke. This is more likely to develop in people who have high blood pressure readings or are overweight. Have your blood pressure checked: Every 3-5 years if you are 48-35 years of age. Every year if you are 18 years old or older. Diabetes Have regular diabetes screenings. This checks your fasting blood sugar level. Have the screening done: Once every three years after age 12 if you are at a normal weight and have a low risk for diabetes. More often and at a younger age if you are overweight or have a high risk for diabetes. What should I know about preventing infection? Hepatitis B If you have a higher risk for hepatitis B, you should be  screened for this virus. Talk with your health care provider to find out if you are at risk for hepatitis B infection. Hepatitis C Testing is recommended for: Everyone born from [birth year] through 1967. Anyone with known risk factors for hepatitis C. Sexually transmitted infections (STIs) Get screened for STIs, including gonorrhea and  chlamydia, if: You are sexually active and are younger than 64 years of age. You are older than 64 years of age and your health care provider tells you that you are at risk for this type of infection. Your sexual activity has changed since you were last screened, and you are at increased risk for chlamydia or gonorrhea. Ask your health care provider if you are at risk. Ask your health care provider about whether you are at high risk for HIV. Your health care provider may recommend a prescription medicine to help prevent HIV infection. If you choose to take medicine to prevent HIV, you should first get tested for HIV. You should then be tested every 3 months for as long as you are taking the medicine. Pregnancy If you are about to stop having your period (premenopausal) and you may become pregnant, seek counseling before you get pregnant. Take 400 to 800 micrograms (mcg) of folic acid every day if you become pregnant. Ask for birth control (contraception) if you want to prevent pregnancy. Osteoporosis and menopause Osteoporosis is a disease in which the bones lose minerals and strength with aging. This can result in bone fractures. If you are 78 years old or older, or if you are at risk for osteoporosis and fractures, ask your health care provider if you should: Be screened for bone loss. Take a calcium or vitamin D supplement to lower your risk of fractures. Be given hormone replacement therapy (HRT) to treat symptoms of menopause. Follow these instructions at home: Alcohol use Do not drink alcohol if: Your health care provider tells you not to drink. You are pregnant, may be pregnant, or are planning to become pregnant. If you drink alcohol: Limit how much you have to: 0-1 drink a day. Know how much alcohol is in your drink. In the [place] one drink equals one 12 oz bottle of beer (355 mL), one 5 oz glass of wine (148 mL), or one 1 oz glass of hard liquor (44 mL). Lifestyle Do not use any  products that contain nicotine or tobacco. These products include cigarettes, chewing tobacco, and vaping devices, such as e-cigarettes. If you need help quitting, ask your health care provider. Do not use street drugs. Do not share needles. Ask your health care provider for help if you need support or information about quitting drugs. General instructions Schedule regular health, dental, and eye exams. Stay current with your vaccines. Tell your health care provider if: You often feel depressed. You have ever been abused or do not feel safe at home. Summary Adopting a healthy lifestyle and getting preventive care are important in promoting health and wellness. Follow your health care provider's instructions about healthy diet, exercising, and getting tested or screened for diseases. Follow your health care provider's instructions on monitoring your cholesterol and blood pressure. This information is not intended to replace advice given to you by your health care provider. Make sure you discuss any questions you have with your health care provider. Document Revised: 06/10/2020 Document Reviewed: 06/10/2020 Elsevier Patient Education  [place].

## 2022-03-24 ENCOUNTER — Other Ambulatory Visit (INDEPENDENT_AMBULATORY_CARE_PROVIDER_SITE_OTHER): Payer: Federal, State, Local not specified - PPO

## 2022-03-24 ENCOUNTER — Encounter: Payer: Self-pay | Admitting: Family Medicine

## 2022-03-24 DIAGNOSIS — I10 Essential (primary) hypertension: Secondary | ICD-10-CM | POA: Diagnosis not present

## 2022-03-24 DIAGNOSIS — Z131 Encounter for screening for diabetes mellitus: Secondary | ICD-10-CM | POA: Diagnosis not present

## 2022-03-24 DIAGNOSIS — E785 Hyperlipidemia, unspecified: Secondary | ICD-10-CM | POA: Diagnosis not present

## 2022-03-24 LAB — LIPID PANEL
Cholesterol: 225 mg/dL — ABNORMAL HIGH (ref 0–200)
HDL: 57.8 mg/dL (ref >39.00)
LDL Cholesterol: 152 mg/dL — ABNORMAL HIGH (ref 0–99)
NonHDL: 167.27
Total CHOL/HDL Ratio: 4
Triglycerides: 76 mg/dL (ref 0.0–149.0)
VLDL: 15.2 mg/dL (ref 0.0–40.0)

## 2022-03-24 LAB — COMPREHENSIVE METABOLIC PANEL
ALT: 15 U/L (ref 0–35)
AST: 15 U/L (ref 0–37)
Albumin: 4 g/dL (ref 3.5–5.2)
Alkaline Phosphatase: 70 U/L (ref 39–117)
BUN: 16 mg/dL (ref 6–23)
CO2: 24 mEq/L (ref 19–32)
Calcium: 9.4 mg/dL (ref 8.4–10.5)
Chloride: 107 mEq/L (ref 96–112)
Creatinine, Ser: 0.78 mg/dL (ref 0.40–1.20)
GFR: 80.68 mL/min (ref >60.00)
Glucose, Bld: 87 mg/dL (ref 70–99)
Potassium: 4 mEq/L (ref 3.5–5.1)
Sodium: 140 mEq/L (ref 135–145)
Total Bilirubin: 0.4 mg/dL (ref 0.2–1.2)
Total Protein: 7.3 g/dL (ref 6.0–8.3)

## 2022-03-24 LAB — CYTOLOGY - PAP
Comment: NEGATIVE
Diagnosis: NEGATIVE
High risk HPV: POSITIVE — AB

## 2022-03-24 LAB — HEMOGLOBIN A1C: Hgb A1c MFr Bld: 5.8 % (ref 4.6–6.5)

## 2022-03-26 ENCOUNTER — Encounter: Payer: Self-pay | Admitting: Family Medicine

## 2022-03-26 DIAGNOSIS — R87618 Other abnormal cytological findings on specimens from cervix uteri: Secondary | ICD-10-CM

## 2022-05-04 ENCOUNTER — Ambulatory Visit
Admission: RE | Admit: 2022-05-04 | Discharge: 2022-05-04 | Disposition: A | Discharge status: Home / Self Care | Payer: Federal, State, Local not specified - PPO | Source: Ambulatory Visit | Attending: Family Medicine | Admitting: Family Medicine

## 2022-05-04 DIAGNOSIS — Z1231 Encounter for screening mammogram for malignant neoplasm of breast: Secondary | ICD-10-CM | POA: Diagnosis not present

## 2022-05-13 DIAGNOSIS — R0981 Nasal congestion: Secondary | ICD-10-CM | POA: Diagnosis not present

## 2022-05-13 DIAGNOSIS — R0989 Other specified symptoms and signs involving the circulatory and respiratory systems: Secondary | ICD-10-CM | POA: Diagnosis not present

## 2022-05-13 DIAGNOSIS — R059 Cough, unspecified: Secondary | ICD-10-CM | POA: Diagnosis not present

## 2022-05-13 DIAGNOSIS — J208 Acute bronchitis due to other specified organisms: Secondary | ICD-10-CM | POA: Diagnosis not present

## 2022-05-25 ENCOUNTER — Other Ambulatory Visit (HOSPITAL_COMMUNITY)
Admission: RE | Admit: 2022-05-25 | Discharge: 2022-05-25 | Disposition: A | Discharge status: Home / Self Care | Payer: Federal, State, Local not specified - PPO | Source: Ambulatory Visit | Attending: Obstetrics and Gynecology | Admitting: Obstetrics and Gynecology

## 2022-05-25 ENCOUNTER — Ambulatory Visit: Payer: Federal, State, Local not specified - PPO | Admitting: Obstetrics and Gynecology

## 2022-05-25 ENCOUNTER — Encounter: Payer: Self-pay | Admitting: Obstetrics and Gynecology

## 2022-05-25 VITALS — BP 135/73 | HR 85 | Ht 62.0 in | Wt 214.5 lb

## 2022-05-25 DIAGNOSIS — Z01812 Encounter for preprocedural laboratory examination: Secondary | ICD-10-CM | POA: Diagnosis not present

## 2022-05-25 DIAGNOSIS — R87810 Cervical high risk human papillomavirus (HPV) DNA test positive: Secondary | ICD-10-CM | POA: Diagnosis not present

## 2022-05-25 DIAGNOSIS — Z1339 Encounter for screening examination for other mental health and behavioral disorders: Secondary | ICD-10-CM | POA: Diagnosis not present

## 2022-05-25 DIAGNOSIS — D060 Carcinoma in situ of endocervix: Secondary | ICD-10-CM | POA: Diagnosis not present

## 2022-05-25 DIAGNOSIS — R87618 Other abnormal cytological findings on specimens from cervix uteri: Secondary | ICD-10-CM | POA: Insufficient documentation

## 2022-05-25 LAB — POCT URINE PREGNANCY: Preg Test, Ur: NEGATIVE

## 2022-05-25 NOTE — Progress Notes (Signed)
62 yo P2 postmenopausal here for colposcopy due to abnormal pap smear. Patient reports feeling well and is without any complaints.  Past Medical History:  Diagnosis Date   Chicken pox    Hypertension    Dx 2009-2010   Kidney stones    Osteopenia    DEXA 11/22/08   Varicose veins of bilateral lower extremities with pain    Past Surgical History:  Procedure Laterality Date   TUBAL LIGATION  11/24/1984   Family History  Problem Relation Age of Onset   Arthritis Mother    Stroke Mother    Hypertension Mother    Cancer Sister        breast   Breast cancer Sister    Cancer Paternal Uncle        lung and prostate   Breast cancer Maternal Grandmother    Stomach cancer Maternal Aunt    Colon cancer Neg Hx    Esophageal cancer Neg Hx    Liver cancer Neg Hx    Pancreatic cancer Neg Hx    Rectal cancer Neg Hx    Social History   Tobacco Use   Smoking status: Never   Smokeless tobacco: Never  Vaping Use   Vaping Use: Never used  Substance Use Topics   Alcohol use: No   Drug use: No   ROS See pertinent in HPI. All other systems reviewed and non contributory Blood pressure 135/73, pulse 85, height  (1.575 m), weight 214 lb 8 oz (97.3 kg). GENERAL: Well-developed, well-nourished female in no acute distress.  ABDOMEN: Soft, nontender, nondistended. No organomegaly. PELVIC: Normal external female genitalia. Vagina is pink and rugated.  Normal discharge. Normal appearing cervix. Chaperone present during the pelvic exam EXTREMITIES: No cyanosis, clubbing, or edema, 2+ distal pulses.  A/P 62 yo here for colposcopy  Pap smear results 04/2017 normal with Positive HPV 03/2022 normal with Positive HPV  Patient given informed consent, signed copy in the chart, time out was performed.  Placed in lithotomy position. Cervix viewed with speculum and colposcope after application of acetic acid.   Colposcopy adequate?  yes Acetowhite lesions?no Punctation?no Mosaicism?   no Abnormal vasculature?  no Biopsies?no ECC?yes  COMMENTS: Patient was given post procedure instructions.  She will return in 2 weeks for results.  Catalina Antigua, MD

## 2022-05-26 LAB — SURGICAL PATHOLOGY

## 2022-06-10 ENCOUNTER — Encounter: Payer: Self-pay | Admitting: Obstetrics and Gynecology

## 2022-06-10 NOTE — Progress Notes (Unsigned)
ACUTE VISIT Chief Complaint  Patient presents with   Neck Pain    Ongoing for 2 months, no known injury.   Headache   HPI: Ms.Sonora Dillard Douthitt is a 62 y.o. female with past medical history significant for hyperlipidemia, lower extremity vein disease, hypertension, and OA here today complaining of neck pain and headaches for the past 1.5 months as described above.   She describes the neck pain as sharp, occurring on the right side and extending to the low occipital area, though it does not occur daily and is not accompanied by numbness or tingling. The pain is not radiated and has been severe, rated 10/10.   Headache  This is a new problem. The current episode started more than 1 month ago. The problem occurs intermittently. The problem has been waxing and waning. The pain is located in the Frontal region. The pain quality is not similar to prior headaches. The pain is mild. Associated symptoms include coughing, muscle aches and neck pain. Pertinent negatives include no abdominal pain, abnormal behavior, blurred vision, dizziness, drainage, ear pain, eye pain, eye redness, eye watering, facial sweating, fever, hearing loss, insomnia, loss of balance, nausea, numbness, phonophobia, photophobia, rhinorrhea, scalp tenderness, seizures, sinus pressure, sore throat, swollen glands, tingling, visual change, vomiting, weakness or weight loss. Nothing aggravates the symptoms. She has tried nothing for the symptoms. Her past medical history is significant for hypertension and obesity. There is no history of recent head traumas.   The headaches manifest as a feeling of tightness in the frontal area without associated symptoms.  She also reports ongoing hoarseness in the morning for the same duration. She has a history of having had a procedure by an ENT to remove something from her vocal cords about 5 years ago. She has had intermittent episodes of hoarseness since then. She has difficulty  swallowing certain foods, which triggers coughing. She denies heartburn or acid reflux.  She recently visited urgent care for congestion and coughing, where she was diagnosed with allergies and treated with antibiotics and another medication. However, she still experiences some of the symptoms.  She is not currently on any medication to treat above symptoms.  Review of Systems  Constitutional:  Negative for activity change, appetite change, chills, fever and weight loss.  HENT:  Negative for ear pain, hearing loss, rhinorrhea, sinus pressure and sore throat.   Eyes:  Negative for blurred vision, photophobia, pain and redness.  Respiratory:  Positive for cough. Negative for shortness of breath and wheezing.   Cardiovascular:  Negative for chest pain, palpitations and leg swelling.  Gastrointestinal:  Negative for abdominal pain, nausea and vomiting.  Genitourinary:  Negative for decreased urine volume and hematuria.  Musculoskeletal:  Positive for neck pain.  Allergic/Immunologic: Positive for environmental allergies.  Neurological:  Positive for headaches. Negative for dizziness, tingling, seizures, weakness, numbness and loss of balance.  Psychiatric/Behavioral:  Negative for confusion and sleep disturbance. The patient does not have insomnia.   See other pertinent positives and negatives in HPI.  No current outpatient medications on file prior to visit.   No current facility-administered medications on file prior to visit.   Past Medical History:  Diagnosis Date   Chicken pox    Hypertension    Dx 2009-2010   Kidney stones    Osteopenia    DEXA 11/22/08   Varicose veins of bilateral lower extremities with pain    No Known Allergies  Social History   Socioeconomic History   Marital status:  Divorced    Spouse name: Not on file   Number of children: 2   Years of education: Not on file   Highest education level: Not on file  Occupational History   Not on file  Tobacco Use    Smoking status: Never   Smokeless tobacco: Never  Vaping Use   Vaping Use: Never used  Substance and Sexual Activity   Alcohol use: No   Drug use: No   Sexual activity: Not Currently    Birth control/protection: Post-menopausal  Other Topics Concern   Not on file  Social History Narrative   Not on file   Social Determinants of Health   Financial Resource Strain: Patient Declined (06/12/2022)   Overall Financial Resource Strain (CARDIA)    Difficulty of Paying Living Expenses: Patient declined  Food Insecurity: Patient Declined (06/12/2022)   Hunger Vital Sign    Worried About Running Out of Food in the Last Year: Patient declined    Ran Out of Food in the Last Year: Patient declined  Transportation Needs: Patient Declined (06/12/2022)   PRAPARE - Administrator, Civil Service (Medical): Patient declined    Lack of Transportation (Non-Medical): Patient declined  Physical Activity: Sufficiently Active (06/12/2022)   Exercise Vital Sign    Days of Exercise per Week: 5 days    Minutes of Exercise per Session: 150+ min  Stress: No Stress Concern Present (06/12/2022)   [employer] of Occupational Health - Occupational Stress Questionnaire    Feeling of Stress : Not at all  Social Connections: Moderately Integrated (06/12/2022)   Social Connection and Isolation Panel [NHANES]    Frequency of Communication with Friends and Family: More than three times a week    Frequency of Social Gatherings with Friends and Family: Once a week    Attends Religious Services: More than 4 times per year    Active Member of [employer] or Organizations: Yes    Attends Engineer, structural: More than 4 times per year    Marital Status: Divorced   Vitals:   06/12/22 0800  BP: 136/80  Pulse: 75  Resp: 16  Temp: 97.8 F (36.6 C)  SpO2: 98%   Body mass index is 38.14 kg/m.  Physical Exam Vitals and nursing note reviewed.  Constitutional:      General: She is not in acute  distress.    Appearance: She is well-developed.  HENT:     Head: Normocephalic and atraumatic.     Nose:     Right Sinus: No frontal sinus tenderness.     Left Sinus: No frontal sinus tenderness.     Mouth/Throat:     Mouth: Mucous membranes are moist.     Pharynx: Oropharynx is clear.     Comments: Right tonsil mildly bigger than left but no lesions. Eyes:     Extraocular Movements: Extraocular movements intact.     Conjunctiva/sclera: Conjunctivae normal.  Neck:     Thyroid: No thyroid mass.     Comments: No significant limitation of cervical ROM. Cardiovascular:     Rate and Rhythm: Normal rate and regular rhythm.     Heart sounds: No murmur heard. Pulmonary:     Effort: Pulmonary effort is normal. No respiratory distress.     Breath sounds: Normal breath sounds.  Abdominal:     Palpations: Abdomen is soft. There is no hepatomegaly or mass.     Tenderness: There is no abdominal tenderness.  Musculoskeletal:     Cervical  back: No edema or erythema. No muscular tenderness.  Lymphadenopathy:     Cervical: No cervical adenopathy.  Skin:    General: Skin is warm.     Findings: No erythema or rash.  Neurological:     General: No focal deficit present.     Mental Status: She is alert and oriented to person, place, and time.     Cranial Nerves: No cranial nerve deficit.     Gait: Gait normal.  Psychiatric:        Mood and Affect: Mood and affect normal.   ASSESSMENT AND PLAN:  Ms. Ragasa was seen for neck pain and headache.  Neck pain on right side Reported a new problem and persistent for the past 1.5 month. Recommend local massage and icy hot as well as ROM exercises will help. Flexeril at bedtime for up to 21 days recommended, side effects discussed. Further recommendations according to imaging result. If persistent PT can be arranged.   -     Cyclobenzaprine HCl; Take 0.5-1 tablets (5-10 mg total) by mouth at bedtime for 21 days.  Dispense: 21 tablet; Refill: 0 -      DG Cervical Spine Complete; Future  Headache, unspecified headache type Hx and examination do not suggest a serious process. ? Tension headache, allergies among some. Monitor for new symptoms. Flexeril may help.  Dysphonia Intermittently for a while. I could not find ENT procedure. Cough and dysphonia could be related to GERD. If it gets worse, she will need to revisit ENT.  Gastroesophageal reflux disease, unspecified whether esophagitis present Recommend 6-8 weeks of PPI, Omeprazole 40 mg. GERD precautions also recommended.  -     Omeprazole; Take 1 capsule (40 mg total) by mouth daily before breakfast.  Dispense: 60 capsule; Refill: 0  Return in about 2 months (around 08/12/2022) for chronic problems.  Chaselyn Nanney G. [place], MD  [facility]. [place] office.

## 2022-06-12 ENCOUNTER — Ambulatory Visit (INDEPENDENT_AMBULATORY_CARE_PROVIDER_SITE_OTHER): Payer: Federal, State, Local not specified - PPO

## 2022-06-12 ENCOUNTER — Other Ambulatory Visit: Payer: Self-pay | Admitting: Family Medicine

## 2022-06-12 ENCOUNTER — Encounter: Payer: Self-pay | Admitting: Family Medicine

## 2022-06-12 ENCOUNTER — Ambulatory Visit: Payer: Federal, State, Local not specified - PPO | Admitting: Family Medicine

## 2022-06-12 VITALS — BP 136/80 | HR 75 | Temp 97.8°F | Resp 16 | Ht 62.0 in | Wt 208.5 lb

## 2022-06-12 DIAGNOSIS — K219 Gastro-esophageal reflux disease without esophagitis: Secondary | ICD-10-CM | POA: Diagnosis not present

## 2022-06-12 DIAGNOSIS — R490 Dysphonia: Secondary | ICD-10-CM | POA: Diagnosis not present

## 2022-06-12 DIAGNOSIS — M542 Cervicalgia: Secondary | ICD-10-CM

## 2022-06-12 DIAGNOSIS — R519 Headache, unspecified: Secondary | ICD-10-CM | POA: Diagnosis not present

## 2022-06-12 MED ORDER — OMEPRAZOLE 40 MG PO CPDR
40.0000 mg | DELAYED_RELEASE_CAPSULE | Freq: Every day | ORAL | 0 refills | Status: DC
Start: 2022-06-12 — End: 2022-06-12

## 2022-06-12 MED ORDER — CYCLOBENZAPRINE HCL 10 MG PO TABS
5.0000 mg | ORAL_TABLET | Freq: Every day | ORAL | 0 refills | Status: AC
Start: 2022-06-12 — End: 2022-07-03

## 2022-06-12 NOTE — Patient Instructions (Addendum)
A few things to remember from today's visit:  Neck pain on right side - Plan: cyclobenzaprine (FLEXERIL) 10 MG tablet, DG Cervical Spine Complete  Headache, unspecified headache type  Dysphonia  Gastroesophageal reflux disease, unspecified whether esophagitis present - Plan: omeprazole (PRILOSEC) 40 MG capsule  Muscle relaxant at bedtime for 21 days. Local massage and icy hot may help.  Omeprazole for 6-8 weeks.  If you need refills for medications you take chronically, please call your pharmacy. Do not use My Chart to request refills or for acute issues that need immediate attention. If you send a my chart message, it may take a few days to be addressed, specially if I am not in the office.  Please be sure medication list is accurate. If a new problem present, please set up appointment sooner than planned today.

## 2022-07-12 ENCOUNTER — Encounter: Payer: Self-pay | Admitting: Family Medicine

## 2022-07-12 DIAGNOSIS — R059 Cough, unspecified: Secondary | ICD-10-CM

## 2022-07-12 DIAGNOSIS — J392 Other diseases of pharynx: Secondary | ICD-10-CM

## 2022-07-15 DIAGNOSIS — J383 Other diseases of vocal cords: Secondary | ICD-10-CM | POA: Diagnosis not present

## 2022-07-15 DIAGNOSIS — R053 Chronic cough: Secondary | ICD-10-CM | POA: Diagnosis not present

## 2022-07-15 DIAGNOSIS — J392 Other diseases of pharynx: Secondary | ICD-10-CM | POA: Diagnosis not present

## 2022-07-15 DIAGNOSIS — R1310 Dysphagia, unspecified: Secondary | ICD-10-CM | POA: Diagnosis not present

## 2022-07-20 ENCOUNTER — Other Ambulatory Visit (HOSPITAL_COMMUNITY)
Admission: RE | Admit: 2022-07-20 | Discharge: 2022-07-20 | Disposition: A | Discharge status: Home / Self Care | Payer: Federal, State, Local not specified - PPO | Source: Ambulatory Visit | Attending: Obstetrics and Gynecology | Admitting: Obstetrics and Gynecology

## 2022-07-20 ENCOUNTER — Encounter: Payer: Self-pay | Admitting: Obstetrics and Gynecology

## 2022-07-20 ENCOUNTER — Ambulatory Visit (INDEPENDENT_AMBULATORY_CARE_PROVIDER_SITE_OTHER): Payer: Federal, State, Local not specified - PPO | Admitting: Obstetrics and Gynecology

## 2022-07-20 VITALS — BP 116/71 | HR 58 | Ht 62.0 in | Wt 201.0 lb

## 2022-07-20 DIAGNOSIS — R87613 High grade squamous intraepithelial lesion on cytologic smear of cervix (HGSIL): Secondary | ICD-10-CM | POA: Diagnosis not present

## 2022-07-20 DIAGNOSIS — R87810 Cervical high risk human papillomavirus (HPV) DNA test positive: Secondary | ICD-10-CM

## 2022-07-20 DIAGNOSIS — N871 Moderate cervical dysplasia: Secondary | ICD-10-CM

## 2022-07-20 DIAGNOSIS — D069 Carcinoma in situ of cervix, unspecified: Secondary | ICD-10-CM | POA: Diagnosis not present

## 2022-07-20 NOTE — Progress Notes (Signed)
Patient presents today for LEEP procedure. Patient reports doing well and is without any complaints   Patient identified, informed consent obtained, signed copy in chart, time out performed.  Pap smear and colposcopy reviewed.   Pap Negative with +HPV 03/2022 Colpo Biopsy none taken ECC CIN 2-3 05/2022 Teflon coated speculum with smoke evacuator placed.  Cervix visualized. Paracervical block placed.  A medium size LOOP used to remove cone of cervix using blend of cut and cautery on LEEP machine.  Edges/Base cauterized with Ball.  Monsel's solution used for hemostasis.  Patient tolerated procedure well.  Patient given post procedure instructions.  Follow up in 6 months for repeat pap or as needed.

## 2022-07-20 NOTE — Progress Notes (Signed)
62 y.o. PM GYN presents for LEEP; +High risk HPV on PAP.

## 2022-07-21 LAB — SURGICAL PATHOLOGY

## 2022-07-21 NOTE — Progress Notes (Unsigned)
   ACUTE VISIT No chief complaint on file.  HPI: Cheyenne Hancock is a 62 y.o. female, who is here today complaining of *** HPI  Review of Systems See other pertinent positives and negatives in HPI.  Current Outpatient Medications on File Prior to Visit  Medication Sig Dispense Refill   cyclobenzaprine (FLEXERIL) 10 MG tablet Take 10 mg by mouth 3 (three) times daily as needed for muscle spasms.     omeprazole (PRILOSEC) 40 MG capsule TAKE 1 CAPSULE(40 MG) BY MOUTH DAILY BEFORE BREAKFAST 90 capsule 0   No current facility-administered medications on file prior to visit.    Past Medical History:  Diagnosis Date   Chicken pox    Hypertension    Dx 2009-2010   Kidney stones    Osteopenia    DEXA 11/22/08   Varicose veins of bilateral lower extremities with pain    No Known Allergies  Social History   Socioeconomic History   Marital status: Divorced    Spouse name: Not on file   Number of children: 2   Years of education: Not on file   Highest education level: Not on file  Occupational History   Not on file  Tobacco Use   Smoking status: Never   Smokeless tobacco: Never  Vaping Use   Vaping Use: Never used  Substance and Sexual Activity   Alcohol use: No   Drug use: No   Sexual activity: Not Currently    Birth control/protection: Post-menopausal  Other Topics Concern   Not on file  Social History Narrative   Not on file   Social Determinants of Health   Financial Resource Strain: Patient Declined (06/12/2022)   Overall Financial Resource Strain (CARDIA)    Difficulty of Paying Living Expenses: Patient declined  Food Insecurity: Patient Declined (06/12/2022)   Hunger Vital Sign    Worried About Running Out of Food in the Last Year: Patient declined    Ran Out of Food in the Last Year: Patient declined  Transportation Needs: Patient Declined (06/12/2022)   PRAPARE - Administrator, Civil Service (Medical): Patient declined    Lack of  Transportation (Non-Medical): Patient declined  Physical Activity: Sufficiently Active (06/12/2022)   Exercise Vital Sign    Days of Exercise per Week: 5 days    Minutes of Exercise per Session: 150+ min  Stress: No Stress Concern Present (06/12/2022)   [employer] of Occupational Health - Occupational Stress Questionnaire    Feeling of Stress : Not at all  Social Connections: Moderately Integrated (06/12/2022)   Social Connection and Isolation Panel [NHANES]    Frequency of Communication with Friends and Family: More than three times a week    Frequency of Social Gatherings with Friends and Family: Once a week    Attends Religious Services: More than 4 times per year    Active Member of [employer] or Organizations: Yes    Attends Engineer, structural: More than 4 times per year    Marital Status: Divorced    There were no vitals filed for this visit. There is no height or weight on file to calculate BMI.  Physical Exam  ASSESSMENT AND PLAN: There are no diagnoses linked to this encounter.  No follow-ups on file.  Jaishon Krisher G. [place], MD  [facility]. [place] office.  Discharge Instructions   None

## 2022-07-22 ENCOUNTER — Ambulatory Visit: Payer: Federal, State, Local not specified - PPO | Admitting: Family Medicine

## 2022-07-22 ENCOUNTER — Encounter: Payer: Self-pay | Admitting: Family Medicine

## 2022-07-22 ENCOUNTER — Ambulatory Visit (INDEPENDENT_AMBULATORY_CARE_PROVIDER_SITE_OTHER): Payer: Federal, State, Local not specified - PPO

## 2022-07-22 VITALS — BP 128/80 | HR 75 | Temp 98.1°F | Resp 16 | Ht 62.0 in | Wt 203.5 lb

## 2022-07-22 DIAGNOSIS — R052 Subacute cough: Secondary | ICD-10-CM | POA: Diagnosis not present

## 2022-07-22 DIAGNOSIS — Z23 Encounter for immunization: Secondary | ICD-10-CM

## 2022-07-22 DIAGNOSIS — K219 Gastro-esophageal reflux disease without esophagitis: Secondary | ICD-10-CM | POA: Diagnosis not present

## 2022-07-22 DIAGNOSIS — M67432 Ganglion, left wrist: Secondary | ICD-10-CM | POA: Diagnosis not present

## 2022-07-22 DIAGNOSIS — R059 Cough, unspecified: Secondary | ICD-10-CM | POA: Diagnosis not present

## 2022-07-22 NOTE — Patient Instructions (Addendum)
A few things to remember from today's visit:  Subacute cough - Plan: DG Chest 2 View  Gastroesophageal reflux disease, unspecified whether esophagitis present  Ganglion cyst of volar aspect of left wrist Increased Omeprazole dose as recommended and keep appt with ENT.  If you need refills for medications you take chronically, please call your pharmacy. Do not use My Chart to request refills or for acute issues that need immediate attention. If you send a my chart message, it may take a few days to be addressed, specially if I am not in the office.  Please be sure medication list is accurate. If a new problem present, please set up appointment sooner than planned today.

## 2022-07-23 NOTE — Assessment & Plan Note (Signed)
Increase Omeprazole from 40 mg daily to bid. GERD precautions.

## 2022-07-23 NOTE — Assessment & Plan Note (Addendum)
Educated about Dx,prognosis, and treatment options. She prefers to hold on ortho evaluation for now. Monitor for new symptoms.

## 2022-07-27 ENCOUNTER — Ambulatory Visit: Payer: Federal, State, Local not specified - PPO | Admitting: Family Medicine

## 2022-09-03 DIAGNOSIS — Z86711 Personal history of pulmonary embolism: Secondary | ICD-10-CM

## 2022-09-03 HISTORY — DX: Personal history of pulmonary embolism: Z86.711

## 2022-09-17 ENCOUNTER — Emergency Department (HOSPITAL_COMMUNITY): Payer: Federal, State, Local not specified - PPO

## 2022-09-17 ENCOUNTER — Inpatient Hospital Stay (HOSPITAL_COMMUNITY)
Admission: EM | Admit: 2022-09-17 | Discharge: 2022-09-20 | DRG: 175 | Disposition: A | Discharge status: Home / Self Care | Payer: Federal, State, Local not specified - PPO | Attending: Family Medicine | Admitting: Family Medicine

## 2022-09-17 ENCOUNTER — Other Ambulatory Visit: Payer: Self-pay

## 2022-09-17 DIAGNOSIS — Z823 Family history of stroke: Secondary | ICD-10-CM

## 2022-09-17 DIAGNOSIS — Z87442 Personal history of urinary calculi: Secondary | ICD-10-CM | POA: Diagnosis not present

## 2022-09-17 DIAGNOSIS — Z803 Family history of malignant neoplasm of breast: Secondary | ICD-10-CM

## 2022-09-17 DIAGNOSIS — I10 Essential (primary) hypertension: Secondary | ICD-10-CM | POA: Diagnosis present

## 2022-09-17 DIAGNOSIS — R918 Other nonspecific abnormal finding of lung field: Secondary | ICD-10-CM | POA: Diagnosis not present

## 2022-09-17 DIAGNOSIS — R079 Chest pain, unspecified: Secondary | ICD-10-CM | POA: Diagnosis not present

## 2022-09-17 DIAGNOSIS — Z8261 Family history of arthritis: Secondary | ICD-10-CM

## 2022-09-17 DIAGNOSIS — Z800 Family history of malignant neoplasm of digestive organs: Secondary | ICD-10-CM | POA: Diagnosis not present

## 2022-09-17 DIAGNOSIS — I2699 Other pulmonary embolism without acute cor pulmonale: Secondary | ICD-10-CM | POA: Diagnosis not present

## 2022-09-17 DIAGNOSIS — N201 Calculus of ureter: Secondary | ICD-10-CM | POA: Diagnosis not present

## 2022-09-17 DIAGNOSIS — I2609 Other pulmonary embolism with acute cor pulmonale: Secondary | ICD-10-CM | POA: Diagnosis not present

## 2022-09-17 DIAGNOSIS — U071 COVID-19: Secondary | ICD-10-CM | POA: Diagnosis present

## 2022-09-17 DIAGNOSIS — M8580 Other specified disorders of bone density and structure, unspecified site: Secondary | ICD-10-CM | POA: Diagnosis not present

## 2022-09-17 DIAGNOSIS — R935 Abnormal findings on diagnostic imaging of other abdominal regions, including retroperitoneum: Secondary | ICD-10-CM | POA: Diagnosis not present

## 2022-09-17 DIAGNOSIS — Z8249 Family history of ischemic heart disease and other diseases of the circulatory system: Secondary | ICD-10-CM

## 2022-09-17 DIAGNOSIS — J984 Other disorders of lung: Secondary | ICD-10-CM | POA: Diagnosis not present

## 2022-09-17 DIAGNOSIS — R0789 Other chest pain: Secondary | ICD-10-CM | POA: Diagnosis not present

## 2022-09-17 LAB — BASIC METABOLIC PANEL
Anion gap: 13 (ref 5–15)
BUN: 5 mg/dL — ABNORMAL LOW (ref 8–23)
CO2: 24 mmol/L (ref 22–32)
Calcium: 9.5 mg/dL (ref 8.9–10.3)
Chloride: 99 mmol/L (ref 98–111)
Creatinine, Ser: 1.03 mg/dL — ABNORMAL HIGH (ref 0.44–1.00)
GFR, Estimated: >60 mL/min (ref >60)
Glucose, Bld: 113 mg/dL — ABNORMAL HIGH (ref 70–99)
Potassium: 3.9 mmol/L (ref 3.5–5.1)
Sodium: 136 mmol/L (ref 135–145)

## 2022-09-17 LAB — CBC
HCT: 37.2 % (ref 36.0–46.0)
Hemoglobin: 12.1 g/dL (ref 12.0–15.0)
MCH: 28.4 pg (ref 26.0–34.0)
MCHC: 32.5 g/dL (ref 30.0–36.0)
MCV: 87.3 fL (ref 80.0–100.0)
Platelets: 279 10*3/uL (ref 150–400)
RBC: 4.26 MIL/uL (ref 3.87–5.11)
RDW: 13.1 % (ref 11.5–15.5)
WBC: 10.4 10*3/uL (ref 4.0–10.5)
nRBC: 0 % (ref 0.0–0.2)

## 2022-09-17 LAB — RESP PANEL BY RT-PCR (RSV, FLU A&B, COVID)  RVPGX2
Influenza A by PCR: NEGATIVE
Influenza B by PCR: NEGATIVE
Resp Syncytial Virus by PCR: NEGATIVE
SARS Coronavirus 2 by RT PCR: POSITIVE — AB

## 2022-09-17 LAB — URINALYSIS, ROUTINE W REFLEX MICROSCOPIC
Bilirubin Urine: NEGATIVE
Glucose, UA: NEGATIVE mg/dL
Hgb urine dipstick: NEGATIVE
Ketones, ur: 5 mg/dL — AB
Leukocytes,Ua: NEGATIVE
Nitrite: NEGATIVE
Protein, ur: NEGATIVE mg/dL
Specific Gravity, Urine: 1.004 — ABNORMAL LOW (ref 1.005–1.030)
pH: 5 (ref 5.0–8.0)

## 2022-09-17 LAB — TROPONIN I (HIGH SENSITIVITY): Troponin I (High Sensitivity): 18 ng/L — ABNORMAL HIGH (ref <18)

## 2022-09-17 NOTE — ED Triage Notes (Signed)
Patient reports intermittent central chest pain with SOB for 3 days  , no emesis or diaphoresis , she adds low back pain for 4 days , denies injury or dysuria .

## 2022-09-17 NOTE — ED Provider Triage Note (Signed)
Emergency Medicine Provider Triage Evaluation Note  Addisen Elbaz , a 62 y.o. female  was evaluated in triage.  Pt complains of shortness of breath which has been present for the last several days.  Patient states that she started having a lot of nasal congestion and eye watering last week which she self treated with allergy medicine.  She states that that got better but is now been short of breath with minimal exertion.  She also states that she gets these episodes of right lower back pain that radiates up into the right lower breast.  This causes her chest to hurt.  She is currently asymptomatic with this.  Review of Systems  Positive:  Negative: See above   Physical Exam  BP (!) 180/71 (BP Location: Right Arm)   Pulse 92   Temp 98.7 F (37.1 C) (Oral)   Resp 19   SpO2 94%  Gen:   Awake, no distress   Resp:  Normal effort  MSK:   Moves extremities without difficulty  Other:  No abdominal tenderness  Medical Decision Making  Medically screening exam initiated at 9:21 PM.  Appropriate orders placed.  Blanche [place] Quiocho was informed that the remainder of the evaluation will be completed by another provider, this initial triage assessment does not replace that evaluation, and the importance of remaining in the ED until their evaluation is complete.     Honor Loh [place], [place] 09/17/22 2123

## 2022-09-18 ENCOUNTER — Other Ambulatory Visit (HOSPITAL_COMMUNITY): Payer: Self-pay

## 2022-09-18 ENCOUNTER — Emergency Department (HOSPITAL_COMMUNITY): Payer: Federal, State, Local not specified - PPO

## 2022-09-18 ENCOUNTER — Inpatient Hospital Stay (HOSPITAL_COMMUNITY): Payer: Federal, State, Local not specified - PPO

## 2022-09-18 ENCOUNTER — Encounter (HOSPITAL_COMMUNITY): Payer: Self-pay | Admitting: Family Medicine

## 2022-09-18 DIAGNOSIS — Z800 Family history of malignant neoplasm of digestive organs: Secondary | ICD-10-CM | POA: Diagnosis not present

## 2022-09-18 DIAGNOSIS — I2609 Other pulmonary embolism with acute cor pulmonale: Secondary | ICD-10-CM

## 2022-09-18 DIAGNOSIS — M7989 Other specified soft tissue disorders: Secondary | ICD-10-CM

## 2022-09-18 DIAGNOSIS — J984 Other disorders of lung: Secondary | ICD-10-CM | POA: Diagnosis not present

## 2022-09-18 DIAGNOSIS — Z87442 Personal history of urinary calculi: Secondary | ICD-10-CM | POA: Diagnosis not present

## 2022-09-18 DIAGNOSIS — Z0389 Encounter for observation for other suspected diseases and conditions ruled out: Secondary | ICD-10-CM | POA: Diagnosis not present

## 2022-09-18 DIAGNOSIS — M8580 Other specified disorders of bone density and structure, unspecified site: Secondary | ICD-10-CM | POA: Diagnosis present

## 2022-09-18 DIAGNOSIS — Z823 Family history of stroke: Secondary | ICD-10-CM | POA: Diagnosis not present

## 2022-09-18 DIAGNOSIS — Z8249 Family history of ischemic heart disease and other diseases of the circulatory system: Secondary | ICD-10-CM | POA: Diagnosis not present

## 2022-09-18 DIAGNOSIS — I10 Essential (primary) hypertension: Secondary | ICD-10-CM | POA: Diagnosis present

## 2022-09-18 DIAGNOSIS — U071 COVID-19: Secondary | ICD-10-CM | POA: Diagnosis present

## 2022-09-18 DIAGNOSIS — Z803 Family history of malignant neoplasm of breast: Secondary | ICD-10-CM | POA: Diagnosis not present

## 2022-09-18 DIAGNOSIS — I2699 Other pulmonary embolism without acute cor pulmonale: Secondary | ICD-10-CM | POA: Diagnosis not present

## 2022-09-18 DIAGNOSIS — Z8261 Family history of arthritis: Secondary | ICD-10-CM | POA: Diagnosis not present

## 2022-09-18 DIAGNOSIS — R935 Abnormal findings on diagnostic imaging of other abdominal regions, including retroperitoneum: Secondary | ICD-10-CM | POA: Diagnosis not present

## 2022-09-18 DIAGNOSIS — N201 Calculus of ureter: Secondary | ICD-10-CM | POA: Diagnosis present

## 2022-09-18 DIAGNOSIS — R918 Other nonspecific abnormal finding of lung field: Secondary | ICD-10-CM | POA: Diagnosis not present

## 2022-09-18 LAB — ECHOCARDIOGRAM COMPLETE
AR max vel: 1.71 cm2
AV Peak grad: 11.6 mmHg
Ao pk vel: 1.7 m/s
Area-P 1/2: 3.53 cm2
Height: 62 in
MV M vel: 3.26 m/s
MV Peak grad: 42.5 mmHg
P 1/2 time: 591 msec
S' Lateral: 2.5 cm
Weight: 3188.73 oz

## 2022-09-18 LAB — CBC
HCT: 34.8 % — ABNORMAL LOW (ref 36.0–46.0)
Hemoglobin: 11.4 g/dL — ABNORMAL LOW (ref 12.0–15.0)
MCH: 28.4 pg (ref 26.0–34.0)
MCHC: 32.8 g/dL (ref 30.0–36.0)
MCV: 86.6 fL (ref 80.0–100.0)
Platelets: 271 10*3/uL (ref 150–400)
RBC: 4.02 MIL/uL (ref 3.87–5.11)
RDW: 13.2 % (ref 11.5–15.5)
WBC: 9.9 10*3/uL (ref 4.0–10.5)
nRBC: 0 % (ref 0.0–0.2)

## 2022-09-18 LAB — D-DIMER, QUANTITATIVE: D-Dimer, Quant: 2.22 ug{FEU}/mL — ABNORMAL HIGH (ref 0.00–0.50)

## 2022-09-18 LAB — TROPONIN I (HIGH SENSITIVITY): Troponin I (High Sensitivity): 21 ng/L — ABNORMAL HIGH (ref <18)

## 2022-09-18 LAB — HEPARIN LEVEL (UNFRACTIONATED)
Heparin Unfractionated: 1.05 [IU]/mL — ABNORMAL HIGH (ref 0.30–0.70)
Heparin Unfractionated: 0.97 [IU]/mL — ABNORMAL HIGH (ref 0.30–0.70)

## 2022-09-18 LAB — HIV ANTIBODY (ROUTINE TESTING W REFLEX): HIV Screen 4th Generation wRfx: NONREACTIVE

## 2022-09-18 LAB — CREATININE, SERUM
Creatinine, Ser: 0.88 mg/dL (ref 0.44–1.00)
GFR, Estimated: >60 mL/min (ref >60)

## 2022-09-18 LAB — C-REACTIVE PROTEIN: CRP: 15.8 mg/dL — ABNORMAL HIGH (ref <1.0)

## 2022-09-18 LAB — BRAIN NATRIURETIC PEPTIDE: B Natriuretic Peptide: 14.5 pg/mL (ref 0.0–100.0)

## 2022-09-18 MED ORDER — ONDANSETRON HCL 4 MG/2ML IJ SOLN
4.0000 mg | Freq: Four times a day (QID) | INTRAMUSCULAR | Status: DC | PRN
  Administered 2022-09-18: 4 mg via INTRAVENOUS
  Filled 2022-09-18: qty 2

## 2022-09-18 MED ORDER — ACETAMINOPHEN 325 MG PO TABS: 650.0000 mg | ORAL_TABLET | Freq: Four times a day (QID) | ORAL | Status: DC | PRN

## 2022-09-18 MED ORDER — SODIUM CHLORIDE 0.9 % IV SOLN: INTRAVENOUS | Status: DC

## 2022-09-18 MED ORDER — HEPARIN (PORCINE) 25000 UT/250ML-% IV SOLN
1000.0000 [IU]/h | INTRAVENOUS | Status: DC
  Administered 2022-09-18: 1250 [IU]/h via INTRAVENOUS
  Administered 2022-09-18: 1100 [IU]/h via INTRAVENOUS
  Administered 2022-09-20: 950 [IU]/h via INTRAVENOUS
  Filled 2022-09-18 (×4): qty 250

## 2022-09-18 MED ORDER — HYDROMORPHONE HCL 1 MG/ML IJ SOLN
0.5000 mg | INTRAMUSCULAR | Status: DC | PRN
  Administered 2022-09-18 – 2022-09-19 (×5): 0.5 mg via INTRAVENOUS
  Filled 2022-09-18 (×3): qty 0.5
  Filled 2022-09-18: qty 1
  Filled 2022-09-18 (×3): qty 0.5

## 2022-09-18 MED ORDER — OXYCODONE-ACETAMINOPHEN 5-325 MG PO TABS
1.0000 | ORAL_TABLET | Freq: Once | ORAL | Status: AC
  Administered 2022-09-18: 1 via ORAL
  Filled 2022-09-18: qty 1

## 2022-09-18 MED ORDER — HYDRALAZINE HCL 25 MG PO TABS: 25.0000 mg | ORAL_TABLET | Freq: Four times a day (QID) | ORAL | Status: DC | PRN

## 2022-09-18 MED ORDER — ACETAMINOPHEN 650 MG RE SUPP: 650.0000 mg | Freq: Four times a day (QID) | RECTAL | Status: DC | PRN

## 2022-09-18 MED ORDER — HEPARIN BOLUS VIA INFUSION
5000.0000 [IU] | Freq: Once | INTRAVENOUS | Status: AC
  Administered 2022-09-18: 5000 [IU] via INTRAVENOUS
  Filled 2022-09-18: qty 5000

## 2022-09-18 MED ORDER — IOHEXOL 350 MG/ML SOLN
75.0000 mL | Freq: Once | INTRAVENOUS | Status: AC | PRN
  Administered 2022-09-18: 75 mL via INTRAVENOUS

## 2022-09-18 NOTE — H&P (Addendum)
TRH H&P    Patient Demographics:    Cheyenne Hancock, is a 62 y.o. female  MRN: [id number]  DOB - 08-15-1960  Admit Date - 09/17/2022  Referring MD/NP/PA: Dutch Quint  Outpatient Primary MD for the patient is Hancock, Cheyenne G, MD  Patient coming from: Home  Chief complaint-    HPI:    Cheyenne Hancock  is a 62 y.o. female, with no significant medical problems presented to the hospital with chest pain and shortness of breath.  Patient says that she had runny nose with upper respiratory symptoms last week, did not have sore throat, no cough did not cough up any phlegm, no shortness of breath.  She felt that this was like allergies.  But 2 days ago she started having shortness of breath on exertion also started having pain in the right flank area with radiation towards upper abdomen.  Has been experiencing central chest pain. In the ED CTA chest showed pulmonary embolism with right heart strain.  Patient started on heparin.  Also found to have SARS-CoV 2 RT-PCR positive. Chest x-ray is clear, no infiltrate noted.  Patient denies having any fever or chills Denies fatigue Denies nausea vomiting or diarrhea Denies dysuria or abdominal pain  Initially when patient presented her O2 sats was 94% on room air, put on 1.5 L of oxygen for comfort as she was getting shortness of breath on exertion .  Currently O2 sats 100%   Review of systems:    In addition to the HPI above,   All other systems reviewed and are negative.    Past History of the following :    Past Medical History:  Diagnosis Date   Chicken pox    Hypertension    Dx 2009-2010   Kidney stones    Osteopenia    DEXA 11/22/08   Varicose veins of bilateral lower extremities with pain       Past Surgical History:  Procedure Laterality Date   TUBAL LIGATION  11/24/1984      Social History:      Social History   Tobacco Use    Smoking status: Never   Smokeless tobacco: Never  Substance Use Topics   Alcohol use: No       Family History :     Family History  Problem Relation Age of Onset   Arthritis Mother    Stroke Mother    Hypertension Mother    Cancer Sister        breast   Breast cancer Sister    Cancer Paternal Uncle        lung and prostate   Breast cancer Maternal Grandmother    Stomach cancer Maternal Aunt    Colon cancer Neg Hx    Esophageal cancer Neg Hx    Liver cancer Neg Hx    Pancreatic cancer Neg Hx    Rectal cancer Neg Hx       Home Medications:   Prior to Admission medications   Medication Sig Start Date End Date Taking? Authorizing Provider  acetaminophen (TYLENOL) 500 MG tablet Take 500 mg by mouth daily as needed for mild pain or moderate pain.   Yes [provider]     Allergies:    No Known Allergies   Physical Exam:   Vitals  Blood pressure 132/80, pulse 77, temperature (!) 97 F (36.1 C), resp. rate 10, height 5\' 2"  (1.575 m), weight 90.4 kg, SpO2 98%.  1.  General: Appears in no acute distress  2. Psychiatric: Alert, oriented x 3, no focal deficit noted  3. Neurologic: Cranial nerves II through XII mostly intact  4. HEENMT:  Atraumatic normocephalic, extraocular muscles are intact  5. Respiratory : Lungs clear to auscultation bilaterally  6. Cardiovascular : S1-S2, regular, no murmur auscultated  7. Gastrointestinal:  Soft, nontender, no organomegaly     Data Review:    CBC Recent Labs  Lab 09/17/22 2122  WBC 10.4  HGB 12.1  HCT 37.2  PLT 279  MCV 87.3  MCH 28.4  MCHC 32.5  RDW 13.1   ------------------------------------------------------------------------------------------------------------------  Results for orders placed or performed during the hospital encounter of 09/17/22 (from the past 48 hour(s))  Basic metabolic panel     Status: Abnormal   Collection Time: 09/17/22  9:22 PM  Result Value Ref Range   Sodium  136 135 - 145 mmol/L   Potassium 3.9 3.5 - 5.1 mmol/L   Chloride 99 98 - 111 mmol/L   CO2 24 22 - 32 mmol/L   Glucose, Bld 113 (H) 70 - 99 mg/dL    Comment: Glucose reference range applies only to samples taken after fasting for at least 8 hours.   BUN 5 (L) 8 - 23 mg/dL   Creatinine, Ser 0.98 (H) 0.44 - 1.00 mg/dL   Calcium 9.5 8.9 - 11.9 mg/dL   GFR, Estimated >14 >78 mL/min    Comment: (NOTE) Calculated using the CKD-EPI Creatinine Equation (2021)    Anion gap 13 5 - 15    Comment: Performed at [facility], [address] [place], [place] [address]  CBC     Status: None   Collection Time: 09/17/22  9:22 PM  Result Value Ref Range   WBC 10.4 4.0 - 10.5 K/uL   RBC 4.26 3.87 - 5.11 MIL/uL   Hemoglobin 12.1 12.0 - 15.0 g/dL   HCT 13.0 86.5 - 78.4 %   MCV 87.3 80.0 - 100.0 fL   MCH 28.4 26.0 - 34.0 pg   MCHC 32.5 30.0 - 36.0 g/dL   RDW 69.6 29.5 - 28.4 %   Platelets 279 150 - 400 K/uL   nRBC 0.0 0.0 - 0.2 %    Comment: Performed at [facility], [address], [place], [place] [address]  Troponin I (High Sensitivity)     Status: Abnormal   Collection Time: 09/17/22  9:22 PM  Result Value Ref Range   Troponin I (High Sensitivity) 18 (H) <18 ng/L    Comment: (NOTE) Elevated high sensitivity troponin I (hsTnI) values and significant  changes across serial measurements may suggest ACS but many other  chronic and acute conditions are known to elevate hsTnI results.  Refer to the "Links" section for chest pain algorithms and additional  guidance. Performed at [facility], [address], [place], [place] [address]   Resp panel by RT-PCR (RSV, Flu A&B, Covid) Anterior Nasal Swab     Status: Abnormal   Collection Time: 09/17/22  9:22 PM   Specimen: Anterior Nasal Swab  Result Value Ref Range   SARS Coronavirus 2 by RT PCR POSITIVE (A) NEGATIVE   Influenza A by PCR NEGATIVE NEGATIVE   Influenza B by PCR NEGATIVE NEGATIVE    Comment: (NOTE) The Xpert Xpress  SARS-CoV-2/FLU/RSV plus assay is intended as an aid in the diagnosis of influenza from Nasopharyngeal swab specimens and should not be used as a sole basis for treatment. Nasal washings and aspirates are unacceptable for Xpert Xpress SARS-CoV-2/FLU/RSV testing.  Fact Sheet for Patients: BloggerCourse.com  Fact Sheet for Healthcare Providers: SeriousBroker.it  This test is not yet approved or cleared by the [place] FDA and has been authorized for detection and/or diagnosis of SARS-CoV-2 by FDA under an Emergency Use Authorization (EUA). This EUA will remain in effect (meaning this test can be used) for the duration of the COVID-19 declaration under Section 564(b)(1) of the Act, 21 U.S.C. section 360bbb-3(b)(1), unless the authorization is terminated or revoked.     Resp Syncytial Virus by PCR NEGATIVE NEGATIVE    Comment: (NOTE) Fact Sheet for Patients: BloggerCourse.com  Fact Sheet for Healthcare Providers: SeriousBroker.it  This test is not yet approved or cleared by the [place] FDA and has been authorized for detection and/or diagnosis of SARS-CoV-2 by FDA under an Emergency Use Authorization (EUA). This EUA will remain in effect (meaning this test can be used) for the duration of the COVID-19 declaration under Section 564(b)(1) of the Act, 21 U.S.C. section 360bbb-3(b)(1), unless the authorization is terminated or revoked.  Performed at [facility], [address] [place], [place] [address]   Urinalysis, Routine w reflex microscopic -Urine, Clean Catch     Status: Abnormal   Collection Time: 09/17/22  9:30 PM  Result Value Ref Range   Color, Urine YELLOW YELLOW   APPearance HAZY (A) CLEAR   Specific Gravity, Urine 1.004 (L) 1.005 - 1.030   pH 5.0 5.0 - 8.0   Glucose, UA NEGATIVE NEGATIVE mg/dL   Hgb urine dipstick NEGATIVE NEGATIVE   Bilirubin Urine  NEGATIVE NEGATIVE   Ketones, ur 5 (A) NEGATIVE mg/dL   Protein, ur NEGATIVE NEGATIVE mg/dL   Nitrite NEGATIVE NEGATIVE   Leukocytes,Ua NEGATIVE NEGATIVE    Comment: Performed at [facility], [address] [place], [place] [address]  Troponin I (High Sensitivity)     Status: Abnormal   Collection Time: 09/17/22 11:25 PM  Result Value Ref Range   Troponin I (High Sensitivity) 21 (H) <18 ng/L    Comment: (NOTE) Elevated high sensitivity troponin I (hsTnI) values and significant  changes across serial measurements may suggest ACS but many other  chronic and acute conditions are known to elevate hsTnI results.  Refer to the "Links" section for chest pain algorithms and additional  guidance. Performed at [facility], [address] [place], [place] [address]     Chemistries  Recent Labs  Lab 09/17/22 2122  NA 136  K 3.9  CL 99  CO2 24  GLUCOSE 113*  BUN 5*  CREATININE 1.03*  CALCIUM 9.5   ------------------------------------------------------------------------------------------------------------------  ------------------------------------------------------------------------------------------------------------------ GFR: Estimated Creatinine Clearance: 57.7 mL/min (A) (by C-G formula based on SCr of 1.03 mg/dL (H)). Liver Function Tests: No results for input(s): "AST", "ALT", "ALKPHOS", "BILITOT", "PROT", "ALBUMIN" in the last 168 hours. No results for input(s): "LIPASE", "AMYLASE" in the last 168 hours. No results for input(s): "AMMONIA" in the last 168 hours. Coagulation Profile: No results for input(s): "INR", "PROTIME" in the last 168 hours. Cardiac Enzymes: No  results for input(s): "CKTOTAL", "CKMB", "CKMBINDEX", "TROPONINI" in the last 168 hours. BNP (last 3 results) No results for input(s): "PROBNP" in the last 8760 hours. HbA1C: No results for input(s): "HGBA1C" in the last 72 hours. CBG: No results for input(s): "GLUCAP" in the last 168  hours. Lipid Profile: No results for input(s): "CHOL", "HDL", "LDLCALC", "TRIG", "CHOLHDL", "LDLDIRECT" in the last 72 hours. Thyroid Function Tests: No results for input(s): "TSH", "T4TOTAL", "FREET4", "T3FREE", "THYROIDAB" in the last 72 hours. Anemia Panel: No results for input(s): "VITAMINB12", "FOLATE", "FERRITIN", "TIBC", "IRON", "RETICCTPCT" in the last 72 hours.  --------------------------------------------------------------------------------------------------------------- Urine analysis:    Component Value Date/Time   COLORURINE YELLOW 09/17/2022 2130   APPEARANCEUR HAZY (A) 09/17/2022 2130   LABSPEC 1.004 (L) 09/17/2022 2130   PHURINE 5.0 09/17/2022 2130   GLUCOSEU NEGATIVE 09/17/2022 2130   GLUCOSEU NEGATIVE 04/11/2018 1132   HGBUR NEGATIVE 09/17/2022 2130   BILIRUBINUR NEGATIVE 09/17/2022 2130   BILIRUBINUR n 04/14/2017 1022   KETONESUR 5 (A) 09/17/2022 2130   PROTEINUR NEGATIVE 09/17/2022 2130   UROBILINOGEN 0.2 04/11/2018 1132   NITRITE NEGATIVE 09/17/2022 2130   LEUKOCYTESUR NEGATIVE 09/17/2022 2130      Imaging Results:    CT Angio Chest Pulmonary Embolism (PE) W or WO Contrast  Result Date: 09/18/2022 CLINICAL DATA:  Intermittent in central chest pain and shortness of breath. Low back pain. EXAM: CT ANGIOGRAPHY CHEST CT ABDOMEN AND PELVIS WITH CONTRAST TECHNIQUE: Multidetector CT imaging of the chest was performed using the standard protocol during bolus administration of intravenous contrast. Multiplanar CT image reconstructions and MIPs were obtained to evaluate the vascular anatomy. Multidetector CT imaging of the abdomen and pelvis was performed using the standard protocol during bolus administration of intravenous contrast. RADIATION DOSE REDUCTION: This exam was performed according to the departmental dose-optimization program which includes automated exposure control, adjustment of the mA and/or kV according to patient size and/or use of iterative  reconstruction technique. CONTRAST:  75mL OMNIPAQUE IOHEXOL 350 MG/ML SOLN COMPARISON:  CT abdomen and pelvis without contrast 07/31/2007. FINDINGS: CTA CHEST FINDINGS Cardiovascular: The heart size is normal. No substantial pericardial effusion. Mild atherosclerotic calcification is noted in the wall of the thoracic aorta. Nonocclusive pulmonary embolus is identified in the right main pulmonary artery with nearly occlusive thrombus in lobar branches to the right upper, middle, and lower lobes. Nonocclusive thrombus identified in segmental branches to the left upper lobe and left lower lobe. RV/LV ratio is 1.0. Mediastinum/Nodes: No mediastinal lymphadenopathy. There is no hilar lymphadenopathy. The esophagus has normal imaging features. There is no axillary lymphadenopathy. Lungs/Pleura: Ground-glass and consolidative opacity in the posterior right lower lobe noted with some atelectasis at the left base. No suspicious pulmonary nodule or mass. No pleural effusion. Musculoskeletal: No worrisome lytic or sclerotic osseous abnormality. Review of the MIP images confirms the above findings. CT ABDOMEN and PELVIS FINDINGS Hepatobiliary: No suspicious focal abnormality within the liver parenchyma. There is no evidence for gallstones, gallbladder wall thickening, or pericholecystic fluid. No intrahepatic or extrahepatic biliary dilation. Pancreas: No focal mass lesion. No dilatation of the main duct. No intraparenchymal cyst. No peripancreatic edema. Spleen: 9 mm hypodensity identified in the spleen. This too small to characterize but is statistically a benign finding. No followup imaging is recommended. Adrenals/Urinary Tract: No adrenal nodule or mass. Right kidney unremarkable. No substantial right hydroureteronephrosis although there appears to be a 1 mm mid right ureteral stone on axial 50/3 and coronal 92/6. It is possible this finding could be a phlebolith.  Tiny hypodensity in the left kidney is too small to  characterize but likely benign. No followup imaging is recommended. Central sinus cysts noted lower pole left kidney. No followup imaging is recommended. No evidence for hydroureter. The urinary bladder appears normal for the degree of distention. Stomach/Bowel: Stomach is unremarkable. No gastric wall thickening. No evidence of outlet obstruction. Duodenum is normally positioned as is the ligament of Treitz. No small bowel wall thickening. No small bowel dilatation. The terminal ileum is normal. The appendix is normal. No gross colonic mass. No colonic wall thickening. Vascular/Lymphatic: There is mild atherosclerotic calcification of the abdominal aorta without aneurysm. There is no gastrohepatic or hepatoduodenal ligament lymphadenopathy. No retroperitoneal or mesenteric lymphadenopathy. No pelvic sidewall lymphadenopathy. Reproductive: Unremarkable. Other: No intraperitoneal free fluid. Musculoskeletal: No worrisome lytic or sclerotic osseous abnormality. Review of the MIP images confirms the above findings. IMPRESSION: 1. Relatively large volume pulmonary embolus bilaterally with nearly occlusive thrombus in lobar branches to the right upper, middle, and lower lobes. Nonocclusive thrombus identified in segmental branches to the left upper lobe and left lower lobe. RV/LV ratio is 1.0. Positive for acute PE with CT evidence of right heart strain (RV/LV Ratio = 1.0) consistent with at least submassive (intermediate risk) PE. The presence of right heart strain has been associated with an increased risk of morbidity and mortality. Please refer to the "Code PE Focused" order set in EPIC. 2. Ground-glass and consolidative opacity in the posterior right lower lobe. Evolving pulmonary infarct not excluded. 3. Probable 1 mm mid right ureteral stone without appreciable right hydroureteronephrosis. No other urinary stone disease evident. 4.  Aortic Atherosclerosis (ICD10-I70.0). Critical Value/emergent results were called  by telephone at the time of interpretation on 09/18/2022 at 5:44 am to provider [facility] , who verbally acknowledged these results. Electronically Signed   By: Kennith Center M.D.   On: 09/18/2022 05:44   CT ABDOMEN PELVIS W CONTRAST  Result Date: 09/18/2022 CLINICAL DATA:  Intermittent in central chest pain and shortness of breath. Low back pain. EXAM: CT ANGIOGRAPHY CHEST CT ABDOMEN AND PELVIS WITH CONTRAST TECHNIQUE: Multidetector CT imaging of the chest was performed using the standard protocol during bolus administration of intravenous contrast. Multiplanar CT image reconstructions and MIPs were obtained to evaluate the vascular anatomy. Multidetector CT imaging of the abdomen and pelvis was performed using the standard protocol during bolus administration of intravenous contrast. RADIATION DOSE REDUCTION: This exam was performed according to the departmental dose-optimization program which includes automated exposure control, adjustment of the mA and/or kV according to patient size and/or use of iterative reconstruction technique. CONTRAST:  75mL OMNIPAQUE IOHEXOL 350 MG/ML SOLN COMPARISON:  CT abdomen and pelvis without contrast 07/31/2007. FINDINGS: CTA CHEST FINDINGS Cardiovascular: The heart size is normal. No substantial pericardial effusion. Mild atherosclerotic calcification is noted in the wall of the thoracic aorta. Nonocclusive pulmonary embolus is identified in the right main pulmonary artery with nearly occlusive thrombus in lobar branches to the right upper, middle, and lower lobes. Nonocclusive thrombus identified in segmental branches to the left upper lobe and left lower lobe. RV/LV ratio is 1.0. Mediastinum/Nodes: No mediastinal lymphadenopathy. There is no hilar lymphadenopathy. The esophagus has normal imaging features. There is no axillary lymphadenopathy. Lungs/Pleura: Ground-glass and consolidative opacity in the posterior right lower lobe noted with some atelectasis at the  left base. No suspicious pulmonary nodule or mass. No pleural effusion. Musculoskeletal: No worrisome lytic or sclerotic osseous abnormality. Review of the MIP images confirms the above findings. CT  ABDOMEN and PELVIS FINDINGS Hepatobiliary: No suspicious focal abnormality within the liver parenchyma. There is no evidence for gallstones, gallbladder wall thickening, or pericholecystic fluid. No intrahepatic or extrahepatic biliary dilation. Pancreas: No focal mass lesion. No dilatation of the main duct. No intraparenchymal cyst. No peripancreatic edema. Spleen: 9 mm hypodensity identified in the spleen. This too small to characterize but is statistically a benign finding. No followup imaging is recommended. Adrenals/Urinary Tract: No adrenal nodule or mass. Right kidney unremarkable. No substantial right hydroureteronephrosis although there appears to be a 1 mm mid right ureteral stone on axial 50/3 and coronal 92/6. It is possible this finding could be a phlebolith. Tiny hypodensity in the left kidney is too small to characterize but likely benign. No followup imaging is recommended. Central sinus cysts noted lower pole left kidney. No followup imaging is recommended. No evidence for hydroureter. The urinary bladder appears normal for the degree of distention. Stomach/Bowel: Stomach is unremarkable. No gastric wall thickening. No evidence of outlet obstruction. Duodenum is normally positioned as is the ligament of Treitz. No small bowel wall thickening. No small bowel dilatation. The terminal ileum is normal. The appendix is normal. No gross colonic mass. No colonic wall thickening. Vascular/Lymphatic: There is mild atherosclerotic calcification of the abdominal aorta without aneurysm. There is no gastrohepatic or hepatoduodenal ligament lymphadenopathy. No retroperitoneal or mesenteric lymphadenopathy. No pelvic sidewall lymphadenopathy. Reproductive: Unremarkable. Other: No intraperitoneal free fluid.  Musculoskeletal: No worrisome lytic or sclerotic osseous abnormality. Review of the MIP images confirms the above findings. IMPRESSION: 1. Relatively large volume pulmonary embolus bilaterally with nearly occlusive thrombus in lobar branches to the right upper, middle, and lower lobes. Nonocclusive thrombus identified in segmental branches to the left upper lobe and left lower lobe. RV/LV ratio is 1.0. Positive for acute PE with CT evidence of right heart strain (RV/LV Ratio = 1.0) consistent with at least submassive (intermediate risk) PE. The presence of right heart strain has been associated with an increased risk of morbidity and mortality. Please refer to the "Code PE Focused" order set in EPIC. 2. Ground-glass and consolidative opacity in the posterior right lower lobe. Evolving pulmonary infarct not excluded. 3. Probable 1 mm mid right ureteral stone without appreciable right hydroureteronephrosis. No other urinary stone disease evident. 4.  Aortic Atherosclerosis (ICD10-I70.0). Critical Value/emergent results were called by telephone at the time of interpretation on 09/18/2022 at 5:44 am to provider [facility] , who verbally acknowledged these results. Electronically Signed   By: Kennith Center M.D.   On: 09/18/2022 05:44   DG Chest 2 View  Result Date: 09/17/2022 CLINICAL DATA:  Chest pain. EXAM: CHEST - 2 VIEW COMPARISON:  Chest radiograph dated 07/22/2022 FINDINGS: The lungs are clear. There is no pleural effusion or pneumothorax. The cardiac silhouette is within normal limits. Degenerative changes of the spine. No acute osseous pathology. IMPRESSION: No active cardiopulmonary disease. Electronically Signed   By: Elgie Collard M.D.   On: 09/17/2022 22:12    My personal review of EKG: Rhythm NSR, nonspecific ST changes   Assessment & Plan:    Principal Problem:   Pulmonary embolism (HCC)   Pulmonary embolism-CT chest showed bilateral pulmonary embolism with right heart strain.   Currently hemodynamically stable, on 1.5 L of oxygen with 100% O2 sats.  Started on IV heparin.  Will obtain echocardiogram and venous duplex of lower extremities.  COVID-19 infection-found to be positive with SARS 2 COVID RT-PCR, chest x-ray clear.  No indication to use antiviral treatment.  Will obtain CRP and D-dimer.  COVID-19 precautions.  ureteral stone-1 mm right ureteral stone noted on CT chest.  No hydronephrosis noted.  Should pass off its own.  Will monitor.   DVT Prophylaxis-   Lovenox - SCDs   AM Labs Ordered, also please review Full Orders  Family Communication: Admission, patients condition and plan of care including tests being ordered have been discussed with the patient who indicate understanding and agree with the plan and Code Status.  Code Status: Full code  Admission status: Observation/Inpatient :The appropriate admission status for this patient is INPATIENT. Inpatient status is judged to be reasonable and necessary in order to provide the required intensity of service to ensure the patient's safety. The patient's presenting symptoms, physical exam findings, and initial radiographic and laboratory data in the context of their chronic comorbidities is felt to place them at high risk for further clinical deterioration. Furthermore, it is not anticipated that the patient will be medically stable for discharge from the hospital within 2 midnights of admission. The following factors support the admission status of inpatient.        * I certify that at the point of admission it is my clinical judgment that the patient will require inpatient hospital care spanning beyond 2 midnights from the point of admission due to high intensity of service, high risk for further deterioration and high frequency of surveillance required.*  Time spent in minutes : 60 min   Isebella Upshur S Mahkai Fangman M.D

## 2022-09-18 NOTE — ED Provider Notes (Signed)
[place] EMERGENCY DEPARTMENT AT [facility] Provider Note   CSN: [id number] Arrival date & time: 09/17/22  2059     History  Chief Complaint  Patient presents with   Chest Pain   Back Pain    Cheyenne Hancock is a 62 y.o. female.  Reports that she started having "a cold" last week.  Complaining of nasal congestion and cough.  Initially treated as allergies but has not gotten better.  Patient now experiencing shortness of breath when she walks.  In addition to this she has been having some pain in the right flank area that radiates around to the upper abdomen.  She also has been experiencing central chest pain.       Home Medications Prior to Admission medications   Medication Sig Start Date End Date Taking? Authorizing Provider  acetaminophen (TYLENOL) 500 MG tablet Take 500 mg by mouth daily as needed for mild pain or moderate pain.   Yes [provider]      Allergies    Patient has no known allergies.    Review of Systems   Review of Systems  Physical Exam Updated Vital Signs BP 130/64 (BP Location: Right Arm)   Pulse 72   Temp 97.7 F (36.5 C) (Oral)   Resp (!) 21   Ht 5\' 2"  (1.575 m)   Wt 90.4 kg   SpO2 97%   BMI 36.45 kg/m  Physical Exam Vitals and nursing note reviewed.  Constitutional:      General: She is not in acute distress.    Appearance: She is well-developed.  HENT:     Head: Normocephalic and atraumatic.     Mouth/Throat:     Mouth: Mucous membranes are moist.  Eyes:     General: Vision grossly intact. Gaze aligned appropriately.     Extraocular Movements: Extraocular movements intact.     Conjunctiva/sclera: Conjunctivae normal.  Cardiovascular:     Rate and Rhythm: Normal rate and regular rhythm.     Pulses: Normal pulses.     Heart sounds: Normal heart sounds, S1 normal and S2 normal. No murmur heard.    No friction rub. No gallop.  Pulmonary:     Effort: Pulmonary effort is normal. No respiratory  distress.     Breath sounds: Normal breath sounds.  Abdominal:     General: Bowel sounds are normal.     Palpations: Abdomen is soft.     Tenderness: There is no abdominal tenderness. There is no guarding or rebound.     Hernia: No hernia is present.  Musculoskeletal:        General: No swelling.     Cervical back: Full passive range of motion without pain, normal range of motion and neck supple. No spinous process tenderness or muscular tenderness. Normal range of motion.     Right lower leg: No edema.     Left lower leg: No edema.  Skin:    General: Skin is warm and dry.     Capillary Refill: Capillary refill takes less than 2 seconds.     Findings: No ecchymosis, erythema, rash or wound.  Neurological:     General: No focal deficit present.     Mental Status: She is alert and oriented to person, place, and time.     GCS: GCS eye subscore is 4. GCS verbal subscore is 5. GCS motor subscore is 6.     Cranial Nerves: Cranial nerves 2-12 are intact.     Sensory:  Sensation is intact.     Motor: Motor function is intact.     Coordination: Coordination is intact.  Psychiatric:        Attention and Perception: Attention normal.        Mood and Affect: Mood normal.        Speech: Speech normal.        Behavior: Behavior normal.     ED Results / Procedures / Treatments   Labs (all labs ordered are listed, but only abnormal results are displayed) Labs Reviewed  RESP PANEL BY RT-PCR (RSV, FLU A&B, COVID)  RVPGX2 - Abnormal; Notable for the following components:      Result Value   SARS Coronavirus 2 by RT PCR POSITIVE (*)    All other components within normal limits  BASIC METABOLIC PANEL - Abnormal; Notable for the following components:   Glucose, Bld 113 (*)    BUN 5 (*)    Creatinine, Ser 1.03 (*)    All other components within normal limits  URINALYSIS, ROUTINE W REFLEX MICROSCOPIC - Abnormal; Notable for the following components:   APPearance HAZY (*)    Specific Gravity,  Urine 1.004 (*)    Ketones, ur 5 (*)    All other components within normal limits  TROPONIN I (HIGH SENSITIVITY) - Abnormal; Notable for the following components:   Troponin I (High Sensitivity) 18 (*)    All other components within normal limits  TROPONIN I (HIGH SENSITIVITY) - Abnormal; Notable for the following components:   Troponin I (High Sensitivity) 21 (*)    All other components within normal limits  CBC  HEPARIN LEVEL (UNFRACTIONATED)    EKG EKG Interpretation Date/Time:  Thursday September 17 2022 21:10:31 EDT Ventricular Rate:  87 PR Interval:  114 QRS Duration:  78 QT Interval:  348 QTC Calculation: 418 R Axis:   58  Text Interpretation: Normal sinus rhythm Nonspecific ST abnormality Abnormal ECG When compared with ECG of 14-Nov-2018 10:03, No significant change was found Confirmed by Dione Booze ([id number]) on 09/18/2022 1:15:14 AM  Radiology CT Angio Chest Pulmonary Embolism (PE) W or WO Contrast  Result Date: 09/18/2022 CLINICAL DATA:  Intermittent in central chest pain and shortness of breath. Low back pain. EXAM: CT ANGIOGRAPHY CHEST CT ABDOMEN AND PELVIS WITH CONTRAST TECHNIQUE: Multidetector CT imaging of the chest was performed using the standard protocol during bolus administration of intravenous contrast. Multiplanar CT image reconstructions and MIPs were obtained to evaluate the vascular anatomy. Multidetector CT imaging of the abdomen and pelvis was performed using the standard protocol during bolus administration of intravenous contrast. RADIATION DOSE REDUCTION: This exam was performed according to the departmental dose-optimization program which includes automated exposure control, adjustment of the mA and/or kV according to patient size and/or use of iterative reconstruction technique. CONTRAST:  75mL OMNIPAQUE IOHEXOL 350 MG/ML SOLN COMPARISON:  CT abdomen and pelvis without contrast 07/31/2007. FINDINGS: CTA CHEST FINDINGS Cardiovascular: The heart size is normal.  No substantial pericardial effusion. Mild atherosclerotic calcification is noted in the wall of the thoracic aorta. Nonocclusive pulmonary embolus is identified in the right main pulmonary artery with nearly occlusive thrombus in lobar branches to the right upper, middle, and lower lobes. Nonocclusive thrombus identified in segmental branches to the left upper lobe and left lower lobe. RV/LV ratio is 1.0. Mediastinum/Nodes: No mediastinal lymphadenopathy. There is no hilar lymphadenopathy. The esophagus has normal imaging features. There is no axillary lymphadenopathy. Lungs/Pleura: Ground-glass and consolidative opacity in the posterior right lower lobe noted  with some atelectasis at the left base. No suspicious pulmonary nodule or mass. No pleural effusion. Musculoskeletal: No worrisome lytic or sclerotic osseous abnormality. Review of the MIP images confirms the above findings. CT ABDOMEN and PELVIS FINDINGS Hepatobiliary: No suspicious focal abnormality within the liver parenchyma. There is no evidence for gallstones, gallbladder wall thickening, or pericholecystic fluid. No intrahepatic or extrahepatic biliary dilation. Pancreas: No focal mass lesion. No dilatation of the main duct. No intraparenchymal cyst. No peripancreatic edema. Spleen: 9 mm hypodensity identified in the spleen. This too small to characterize but is statistically a benign finding. No followup imaging is recommended. Adrenals/Urinary Tract: No adrenal nodule or mass. Right kidney unremarkable. No substantial right hydroureteronephrosis although there appears to be a 1 mm mid right ureteral stone on axial 50/3 and coronal 92/6. It is possible this finding could be a phlebolith. Tiny hypodensity in the left kidney is too small to characterize but likely benign. No followup imaging is recommended. Central sinus cysts noted lower pole left kidney. No followup imaging is recommended. No evidence for hydroureter. The urinary bladder appears normal  for the degree of distention. Stomach/Bowel: Stomach is unremarkable. No gastric wall thickening. No evidence of outlet obstruction. Duodenum is normally positioned as is the ligament of Treitz. No small bowel wall thickening. No small bowel dilatation. The terminal ileum is normal. The appendix is normal. No gross colonic mass. No colonic wall thickening. Vascular/Lymphatic: There is mild atherosclerotic calcification of the abdominal aorta without aneurysm. There is no gastrohepatic or hepatoduodenal ligament lymphadenopathy. No retroperitoneal or mesenteric lymphadenopathy. No pelvic sidewall lymphadenopathy. Reproductive: Unremarkable. Other: No intraperitoneal free fluid. Musculoskeletal: No worrisome lytic or sclerotic osseous abnormality. Review of the MIP images confirms the above findings. IMPRESSION: 1. Relatively large volume pulmonary embolus bilaterally with nearly occlusive thrombus in lobar branches to the right upper, middle, and lower lobes. Nonocclusive thrombus identified in segmental branches to the left upper lobe and left lower lobe. RV/LV ratio is 1.0. Positive for acute PE with CT evidence of right heart strain (RV/LV Ratio = 1.0) consistent with at least submassive (intermediate risk) PE. The presence of right heart strain has been associated with an increased risk of morbidity and mortality. Please refer to the "Code PE Focused" order set in EPIC. 2. Ground-glass and consolidative opacity in the posterior right lower lobe. Evolving pulmonary infarct not excluded. 3. Probable 1 mm mid right ureteral stone without appreciable right hydroureteronephrosis. No other urinary stone disease evident. 4.  Aortic Atherosclerosis (ICD10-I70.0). Critical Value/emergent results were called by telephone at the time of interpretation on 09/18/2022 at 5:44 am to provider [facility] , who verbally acknowledged these results. Electronically Signed   By: Kennith Center M.D.   On: 09/18/2022 05:44   CT  ABDOMEN PELVIS W CONTRAST  Result Date: 09/18/2022 CLINICAL DATA:  Intermittent in central chest pain and shortness of breath. Low back pain. EXAM: CT ANGIOGRAPHY CHEST CT ABDOMEN AND PELVIS WITH CONTRAST TECHNIQUE: Multidetector CT imaging of the chest was performed using the standard protocol during bolus administration of intravenous contrast. Multiplanar CT image reconstructions and MIPs were obtained to evaluate the vascular anatomy. Multidetector CT imaging of the abdomen and pelvis was performed using the standard protocol during bolus administration of intravenous contrast. RADIATION DOSE REDUCTION: This exam was performed according to the departmental dose-optimization program which includes automated exposure control, adjustment of the mA and/or kV according to patient size and/or use of iterative reconstruction technique. CONTRAST:  75mL OMNIPAQUE IOHEXOL 350 MG/ML SOLN  COMPARISON:  CT abdomen and pelvis without contrast 07/31/2007. FINDINGS: CTA CHEST FINDINGS Cardiovascular: The heart size is normal. No substantial pericardial effusion. Mild atherosclerotic calcification is noted in the wall of the thoracic aorta. Nonocclusive pulmonary embolus is identified in the right main pulmonary artery with nearly occlusive thrombus in lobar branches to the right upper, middle, and lower lobes. Nonocclusive thrombus identified in segmental branches to the left upper lobe and left lower lobe. RV/LV ratio is 1.0. Mediastinum/Nodes: No mediastinal lymphadenopathy. There is no hilar lymphadenopathy. The esophagus has normal imaging features. There is no axillary lymphadenopathy. Lungs/Pleura: Ground-glass and consolidative opacity in the posterior right lower lobe noted with some atelectasis at the left base. No suspicious pulmonary nodule or mass. No pleural effusion. Musculoskeletal: No worrisome lytic or sclerotic osseous abnormality. Review of the MIP images confirms the above findings. CT ABDOMEN and PELVIS  FINDINGS Hepatobiliary: No suspicious focal abnormality within the liver parenchyma. There is no evidence for gallstones, gallbladder wall thickening, or pericholecystic fluid. No intrahepatic or extrahepatic biliary dilation. Pancreas: No focal mass lesion. No dilatation of the main duct. No intraparenchymal cyst. No peripancreatic edema. Spleen: 9 mm hypodensity identified in the spleen. This too small to characterize but is statistically a benign finding. No followup imaging is recommended. Adrenals/Urinary Tract: No adrenal nodule or mass. Right kidney unremarkable. No substantial right hydroureteronephrosis although there appears to be a 1 mm mid right ureteral stone on axial 50/3 and coronal 92/6. It is possible this finding could be a phlebolith. Tiny hypodensity in the left kidney is too small to characterize but likely benign. No followup imaging is recommended. Central sinus cysts noted lower pole left kidney. No followup imaging is recommended. No evidence for hydroureter. The urinary bladder appears normal for the degree of distention. Stomach/Bowel: Stomach is unremarkable. No gastric wall thickening. No evidence of outlet obstruction. Duodenum is normally positioned as is the ligament of Treitz. No small bowel wall thickening. No small bowel dilatation. The terminal ileum is normal. The appendix is normal. No gross colonic mass. No colonic wall thickening. Vascular/Lymphatic: There is mild atherosclerotic calcification of the abdominal aorta without aneurysm. There is no gastrohepatic or hepatoduodenal ligament lymphadenopathy. No retroperitoneal or mesenteric lymphadenopathy. No pelvic sidewall lymphadenopathy. Reproductive: Unremarkable. Other: No intraperitoneal free fluid. Musculoskeletal: No worrisome lytic or sclerotic osseous abnormality. Review of the MIP images confirms the above findings. IMPRESSION: 1. Relatively large volume pulmonary embolus bilaterally with nearly occlusive thrombus in  lobar branches to the right upper, middle, and lower lobes. Nonocclusive thrombus identified in segmental branches to the left upper lobe and left lower lobe. RV/LV ratio is 1.0. Positive for acute PE with CT evidence of right heart strain (RV/LV Ratio = 1.0) consistent with at least submassive (intermediate risk) PE. The presence of right heart strain has been associated with an increased risk of morbidity and mortality. Please refer to the "Code PE Focused" order set in EPIC. 2. Ground-glass and consolidative opacity in the posterior right lower lobe. Evolving pulmonary infarct not excluded. 3. Probable 1 mm mid right ureteral stone without appreciable right hydroureteronephrosis. No other urinary stone disease evident. 4.  Aortic Atherosclerosis (ICD10-I70.0). Critical Value/emergent results were called by telephone at the time of interpretation on 09/18/2022 at 5:44 am to provider [facility] , who verbally acknowledged these results. Electronically Signed   By: Kennith Center M.D.   On: 09/18/2022 05:44   DG Chest 2 View  Result Date: 09/17/2022 CLINICAL DATA:  Chest pain. EXAM: CHEST -  2 VIEW COMPARISON:  Chest radiograph dated 07/22/2022 FINDINGS: The lungs are clear. There is no pleural effusion or pneumothorax. The cardiac silhouette is within normal limits. Degenerative changes of the spine. No acute osseous pathology. IMPRESSION: No active cardiopulmonary disease. Electronically Signed   By: Elgie Collard M.D.   On: 09/17/2022 22:12    Procedures Procedures    Medications Ordered in ED Medications  HYDROmorphone (DILAUDID) injection 0.5 mg (has no administration in time range)  ondansetron (ZOFRAN) injection 4 mg (has no administration in time range)  heparin bolus via infusion 5,000 Units (has no administration in time range)  heparin ADULT infusion 100 units/mL (25000 units/267mL) (has no administration in time range)  oxyCODONE-acetaminophen (PERCOCET/ROXICET) 5-325 MG per  tablet 1 tablet (1 tablet Oral Given 09/18/22 0059)  iohexol (OMNIPAQUE) 350 MG/ML injection 75 mL (75 mLs Intravenous Contrast Given 09/18/22 0457)    ED Course/ Medical Decision Making/ A&P                                 Medical Decision Making Amount and/or Complexity of Data Reviewed Labs: ordered. Decision-making details documented in ED Course. Radiology: ordered and independent interpretation performed. Decision-making details documented in ED Course.  Risk Prescription drug management.   Differential Diagnosis considered includes, but not limited to: STEMI; NSTEMI; myocarditis; pericarditis; pulmonary embolism; aortic dissection; pneumothorax; pneumonia; gastritis; musculoskeletal pain  Patient presents to the emergency department for evaluation of chest pain.  She developed URI symptoms last week, treated herself for "a cold or allergies".  She has now developed chest discomfort.  COVID swab is positive today.  EKG without ischemia or infarct.  Troponins are very slightly elevated.  PE considered and CT angiography performed.  Patient positive for PE with heart strain.  Initiate heparinization.  Patient also complaining of right sided back/flank pain.  Urinalysis without signs of infection.  CT scan was performed of the abdomen and pelvis and she does have evidence of a 1 mm distal ureteral stone which is likely causing her flank pain.  Pain control, allow to pass.  No intervention necessary at this time.        Final Clinical Impression(s) / ED Diagnoses Final diagnoses:  PE (pulmonary thromboembolism) (HCC)    Rx / DC Orders ED Discharge Orders     None         Jaelon Gatley, Canary Brim, MD 09/18/22 [phone]

## 2022-09-18 NOTE — Progress Notes (Signed)
Lower extremity venous duplex completed. Please see CV Procedures for preliminary results.  Shona Simpson, RVT 09/18/22 3:37 PM

## 2022-09-18 NOTE — Progress Notes (Addendum)
ANTICOAGULATION CONSULT NOTE - Initial Consult  Pharmacy Consult for heparin Indication: pulmonary embolus  No Known Allergies  Patient Measurements: Height: 5\' 2"  (157.5 cm) Weight: 90.4 kg (199 lb 4.7 oz) IBW/kg (Calculated) : 50.1 Heparin Dosing Weight: 71 kg  Vital Signs: Temp: 97.7 F (36.5 C) (08/16 0248) Temp Source: Oral (08/16 0248) BP: 130/64 (08/16 0248) Pulse Rate: 72 (08/16 0248)  Labs: Recent Labs    09/17/22 2122 09/17/22 2325  HGB 12.1  --   HCT 37.2  --   PLT 279  --   CREATININE 1.03*  --   TROPONINIHS 18* 21*    Estimated Creatinine Clearance: 57.7 mL/min (A) (by C-G formula based on SCr of 1.03 mg/dL (H)).   Medical History: Past Medical History:  Diagnosis Date   Chicken pox    Hypertension    Dx 2009-2010   Kidney stones    Osteopenia    DEXA 11/22/08   Varicose veins of bilateral lower extremities with pain    Assessment: 36 yoF presented to the ED with intermittent chest pain and SOB for 3 days. CTA showed bilateral pulmonary emboli with evidence of right heart strain. Pharmacy has been consulted to dose heparin for PE. CBC stable and no PTA anticoagulation   Goal of Therapy:  Heparin level 0.3-0.7 units/ml Monitor platelets by anticoagulation protocol: Yes   Plan:  Give 5000 units bolus x 1 Start heparin infusion at 1250 units/hr Check anti-Xa level in 6 hours and daily while on heparin Continue to monitor H&H and platelets  Arabella Merles, PharmD. Clinical Pharmacist 09/18/2022 5:53 AM

## 2022-09-18 NOTE — Progress Notes (Signed)
  Echocardiogram 2D Echocardiogram has been performed.  Cheyenne Hancock 09/18/2022, 3:10 PM

## 2022-09-18 NOTE — ED Notes (Signed)
ED TO INPATIENT HANDOFF REPORT  ED Nurse Name and Phone #: Sava Proby [phone]  S Name/Age/Gender Cheyenne Hancock 62 y.o. female Room/Bed: 019C/019C  Code Status   Code Status: Full Code  Home/SNF/Other Home Patient oriented to: self, place, time, and situation Is this baseline? Yes   Triage Complete: Triage complete  Chief Complaint Pulmonary embolism [facility]) [I26.99]  Triage Note Patient reports intermittent central chest pain with SOB for 3 days  , no emesis or diaphoresis , she adds low back pain for 4 days , denies injury or dysuria .    Allergies No Known Allergies  Level of Care/Admitting Diagnosis ED Disposition     ED Disposition  Admit   Condition  --   Comment  Hospital Area: [facility] [[id number]]  Level of Care: Telemetry Medical [[id number]]  May admit patient to [facility] or [facility] if equivalent level of care is available:: No  Covid Evaluation: Confirmed COVID Positive  Diagnosis: Pulmonary embolism [facility]) [[id number]]  Admitting Physician: Lovie Chol  Attending Physician: Meredeth Ide [[id number]]  Certification:: I certify this patient will need inpatient services for at least 2 midnights  Expected Medical Readiness: 09/21/2022          B Medical/Surgery History Past Medical History:  Diagnosis Date   Chicken pox    Hypertension    Dx 2009-2010   Kidney stones    Osteopenia    DEXA 11/22/08   Varicose veins of bilateral lower extremities with pain    Past Surgical History:  Procedure Laterality Date   TUBAL LIGATION  11/24/1984     A IV Location/Drains/Wounds Patient Lines/Drains/Airways Status     Active Line/Drains/Airways     Name Placement date Placement time Site Days   Peripheral IV 09/18/22 20 G Right Antecubital 09/18/22  0430  Antecubital  less than 1   Peripheral IV 09/18/22 20 G Left Antecubital 09/18/22  0549  Antecubital  less than 1            Intake/Output Last 24 hours No intake or  output data in the 24 hours ending 09/18/22 1103  Labs/Imaging Results for orders placed or performed during the hospital encounter of 09/17/22 (from the past 48 hour(s))  Basic metabolic panel     Status: Abnormal   Collection Time: 09/17/22  9:22 PM  Result Value Ref Range   Sodium 136 135 - 145 mmol/L   Potassium 3.9 3.5 - 5.1 mmol/L   Chloride 99 98 - 111 mmol/L   CO2 24 22 - 32 mmol/L   Glucose, Bld 113 (H) 70 - 99 mg/dL    Comment: Glucose reference range applies only to samples taken after fasting for at least 8 hours.   BUN 5 (L) 8 - 23 mg/dL   Creatinine, Ser 0.98 (H) 0.44 - 1.00 mg/dL   Calcium 9.5 8.9 - 11.9 mg/dL   GFR, Estimated >14 >78 mL/min    Comment: (NOTE) Calculated using the CKD-EPI Creatinine Equation (2021)    Anion gap 13 5 - 15    Comment: Performed at [facility], [address] [place], [place] [address]  CBC     Status: None   Collection Time: 09/17/22  9:22 PM  Result Value Ref Range   WBC 10.4 4.0 - 10.5 K/uL   RBC 4.26 3.87 - 5.11 MIL/uL   Hemoglobin 12.1 12.0 - 15.0 g/dL   HCT 13.0 86.5 - 78.4 %   MCV 87.3 80.0 -  100.0 fL   MCH 28.4 26.0 - 34.0 pg   MCHC 32.5 30.0 - 36.0 g/dL   RDW 44.0 34.7 - 42.5 %   Platelets 279 150 - 400 K/uL   nRBC 0.0 0.0 - 0.2 %    Comment: Performed at [facility], [address] [place], [place] [address]  Troponin I (High Sensitivity)     Status: Abnormal   Collection Time: 09/17/22  9:22 PM  Result Value Ref Range   Troponin I (High Sensitivity) 18 (H) <18 ng/L    Comment: (NOTE) Elevated high sensitivity troponin I (hsTnI) values and significant  changes across serial measurements may suggest ACS but many other  chronic and acute conditions are known to elevate hsTnI results.  Refer to the "Links" section for chest pain algorithms and additional  guidance. Performed at [facility], [address] [place], [place] [address]   Resp panel by RT-PCR (RSV, Flu A&B, Covid) Anterior Nasal  Swab     Status: Abnormal   Collection Time: 09/17/22  9:22 PM   Specimen: Anterior Nasal Swab  Result Value Ref Range   SARS Coronavirus 2 by RT PCR POSITIVE (A) NEGATIVE   Influenza A by PCR NEGATIVE NEGATIVE   Influenza B by PCR NEGATIVE NEGATIVE    Comment: (NOTE) The Xpert Xpress SARS-CoV-2/FLU/RSV plus assay is intended as an aid in the diagnosis of influenza from Nasopharyngeal swab specimens and should not be used as a sole basis for treatment. Nasal washings and aspirates are unacceptable for Xpert Xpress SARS-CoV-2/FLU/RSV testing.  Fact Sheet for Patients: BloggerCourse.com  Fact Sheet for Healthcare Providers: SeriousBroker.it  This test is not yet approved or cleared by the [place] FDA and has been authorized for detection and/or diagnosis of SARS-CoV-2 by FDA under an Emergency Use Authorization (EUA). This EUA will remain in effect (meaning this test can be used) for the duration of the COVID-19 declaration under Section 564(b)(1) of the Act, 21 U.S.C. section 360bbb-3(b)(1), unless the authorization is terminated or revoked.     Resp Syncytial Virus by PCR NEGATIVE NEGATIVE    Comment: (NOTE) Fact Sheet for Patients: BloggerCourse.com  Fact Sheet for Healthcare Providers: SeriousBroker.it  This test is not yet approved or cleared by the [place] FDA and has been authorized for detection and/or diagnosis of SARS-CoV-2 by FDA under an Emergency Use Authorization (EUA). This EUA will remain in effect (meaning this test can be used) for the duration of the COVID-19 declaration under Section 564(b)(1) of the Act, 21 U.S.C. section 360bbb-3(b)(1), unless the authorization is terminated or revoked.  Performed at [facility], [address] [place], [place] [address]   Urinalysis, Routine w reflex microscopic -Urine, Clean Catch     Status:  Abnormal   Collection Time: 09/17/22  9:30 PM  Result Value Ref Range   Color, Urine YELLOW YELLOW   APPearance HAZY (A) CLEAR   Specific Gravity, Urine 1.004 (L) 1.005 - 1.030   pH 5.0 5.0 - 8.0   Glucose, UA NEGATIVE NEGATIVE mg/dL   Hgb urine dipstick NEGATIVE NEGATIVE   Bilirubin Urine NEGATIVE NEGATIVE   Ketones, ur 5 (A) NEGATIVE mg/dL   Protein, ur NEGATIVE NEGATIVE mg/dL   Nitrite NEGATIVE NEGATIVE   Leukocytes,Ua NEGATIVE NEGATIVE    Comment: Performed at [facility], [address] [place], [place] [address]  Troponin I (High Sensitivity)     Status: Abnormal   Collection Time: 09/17/22 11:25 PM  Result Value Ref Range   Troponin I (High Sensitivity) 21 (H) <18 ng/L    Comment: (NOTE) Elevated high sensitivity troponin I (hsTnI) values and significant  changes across serial measurements may suggest ACS but many other  chronic and acute conditions are known to elevate hsTnI results.  Refer to the "Links" section for chest pain algorithms and additional  guidance. Performed at [facility], [address], [place], [place] [address]   Brain natriuretic peptide     Status: None   Collection Time: 09/18/22  8:57 AM  Result Value Ref Range   B Natriuretic Peptide 14.5 0.0 - 100.0 pg/mL    Comment: Performed at [facility], [address] [place], [place] [address]  D-dimer, quantitative     Status: Abnormal   Collection Time: 09/18/22  8:57 AM  Result Value Ref Range   D-Dimer, Quant 2.22 (H) 0.00 - 0.50 ug/mL-FEU    Comment: (NOTE) At the manufacturer cut-off value of 0.5 g/mL FEU, this assay has a negative predictive value of 95-100%.This assay is intended for use in conjunction with a clinical pretest probability (PTP) assessment model to exclude pulmonary embolism (PE) and deep venous thrombosis (DVT) in outpatients suspected of PE or DVT. Results should be correlated with clinical presentation. Performed at [facility], [address] [place], [place] [address]   C-reactive protein     Status: Abnormal   Collection Time: 09/18/22  8:57 AM  Result Value Ref Range   CRP 15.8 (H) <1.0 mg/dL    Comment: Performed at [facility], [address] [place], [place] [address]  Creatinine, serum     Status: None   Collection Time: 09/18/22  8:57 AM  Result Value Ref Range   Creatinine, Ser 0.88 0.44 - 1.00 mg/dL   GFR, Estimated >84 >13 mL/min    Comment: (NOTE) Calculated using the CKD-EPI Creatinine Equation (2021) Performed at [facility], [address], [place], [place] [address]    CT Angio Chest Pulmonary Embolism (PE) W or WO Contrast  Result Date: 09/18/2022 CLINICAL DATA:  Intermittent in central chest pain and shortness of breath. Low back pain. EXAM: CT ANGIOGRAPHY CHEST CT ABDOMEN AND PELVIS WITH CONTRAST TECHNIQUE: Multidetector CT imaging of the chest was performed using the standard protocol during bolus administration of intravenous contrast. Multiplanar CT image reconstructions and MIPs were obtained to evaluate the vascular anatomy. Multidetector CT imaging of the abdomen and pelvis was performed using the standard protocol during bolus administration of intravenous contrast. RADIATION DOSE REDUCTION: This exam was performed according to the departmental dose-optimization program which includes automated exposure control, adjustment of the mA and/or kV according to patient size and/or use of iterative reconstruction technique. CONTRAST:  75mL OMNIPAQUE IOHEXOL 350 MG/ML SOLN COMPARISON:  CT abdomen and pelvis without contrast 07/31/2007. FINDINGS: CTA CHEST FINDINGS Cardiovascular: The heart size is normal. No substantial pericardial effusion. Mild atherosclerotic calcification is noted in the wall of the thoracic aorta. Nonocclusive pulmonary embolus is identified in the right main pulmonary artery with nearly occlusive thrombus in lobar branches to the right upper, middle, and lower lobes.  Nonocclusive thrombus identified in segmental branches to the left upper lobe and left lower lobe. RV/LV ratio is 1.0. Mediastinum/Nodes: No mediastinal lymphadenopathy. There is no hilar lymphadenopathy. The esophagus has normal imaging features. There is no axillary lymphadenopathy. Lungs/Pleura: Ground-glass and consolidative opacity in the posterior right lower lobe noted with some atelectasis at the  left base. No suspicious pulmonary nodule or mass. No pleural effusion. Musculoskeletal: No worrisome lytic or sclerotic osseous abnormality. Review of the MIP images confirms the above findings. CT ABDOMEN and PELVIS FINDINGS Hepatobiliary: No suspicious focal abnormality within the liver parenchyma. There is no evidence for gallstones, gallbladder wall thickening, or pericholecystic fluid. No intrahepatic or extrahepatic biliary dilation. Pancreas: No focal mass lesion. No dilatation of the main duct. No intraparenchymal cyst. No peripancreatic edema. Spleen: 9 mm hypodensity identified in the spleen. This too small to characterize but is statistically a benign finding. No followup imaging is recommended. Adrenals/Urinary Tract: No adrenal nodule or mass. Right kidney unremarkable. No substantial right hydroureteronephrosis although there appears to be a 1 mm mid right ureteral stone on axial 50/3 and coronal 92/6. It is possible this finding could be a phlebolith. Tiny hypodensity in the left kidney is too small to characterize but likely benign. No followup imaging is recommended. Central sinus cysts noted lower pole left kidney. No followup imaging is recommended. No evidence for hydroureter. The urinary bladder appears normal for the degree of distention. Stomach/Bowel: Stomach is unremarkable. No gastric wall thickening. No evidence of outlet obstruction. Duodenum is normally positioned as is the ligament of Treitz. No small bowel wall thickening. No small bowel dilatation. The terminal ileum is normal. The  appendix is normal. No gross colonic mass. No colonic wall thickening. Vascular/Lymphatic: There is mild atherosclerotic calcification of the abdominal aorta without aneurysm. There is no gastrohepatic or hepatoduodenal ligament lymphadenopathy. No retroperitoneal or mesenteric lymphadenopathy. No pelvic sidewall lymphadenopathy. Reproductive: Unremarkable. Other: No intraperitoneal free fluid. Musculoskeletal: No worrisome lytic or sclerotic osseous abnormality. Review of the MIP images confirms the above findings. IMPRESSION: 1. Relatively large volume pulmonary embolus bilaterally with nearly occlusive thrombus in lobar branches to the right upper, middle, and lower lobes. Nonocclusive thrombus identified in segmental branches to the left upper lobe and left lower lobe. RV/LV ratio is 1.0. Positive for acute PE with CT evidence of right heart strain (RV/LV Ratio = 1.0) consistent with at least submassive (intermediate risk) PE. The presence of right heart strain has been associated with an increased risk of morbidity and mortality. Please refer to the "Code PE Focused" order set in EPIC. 2. Ground-glass and consolidative opacity in the posterior right lower lobe. Evolving pulmonary infarct not excluded. 3. Probable 1 mm mid right ureteral stone without appreciable right hydroureteronephrosis. No other urinary stone disease evident. 4.  Aortic Atherosclerosis (ICD10-I70.0). Critical Value/emergent results were called by telephone at the time of interpretation on 09/18/2022 at 5:44 am to provider [facility] , who verbally acknowledged these results. Electronically Signed   By: Kennith Center M.D.   On: 09/18/2022 05:44   CT ABDOMEN PELVIS W CONTRAST  Result Date: 09/18/2022 CLINICAL DATA:  Intermittent in central chest pain and shortness of breath. Low back pain. EXAM: CT ANGIOGRAPHY CHEST CT ABDOMEN AND PELVIS WITH CONTRAST TECHNIQUE: Multidetector CT imaging of the chest was performed using the  standard protocol during bolus administration of intravenous contrast. Multiplanar CT image reconstructions and MIPs were obtained to evaluate the vascular anatomy. Multidetector CT imaging of the abdomen and pelvis was performed using the standard protocol during bolus administration of intravenous contrast. RADIATION DOSE REDUCTION: This exam was performed according to the departmental dose-optimization program which includes automated exposure control, adjustment of the mA and/or kV according to patient size and/or use of iterative reconstruction technique. CONTRAST:  75mL OMNIPAQUE IOHEXOL 350 MG/ML SOLN COMPARISON:  CT abdomen and  pelvis without contrast 07/31/2007. FINDINGS: CTA CHEST FINDINGS Cardiovascular: The heart size is normal. No substantial pericardial effusion. Mild atherosclerotic calcification is noted in the wall of the thoracic aorta. Nonocclusive pulmonary embolus is identified in the right main pulmonary artery with nearly occlusive thrombus in lobar branches to the right upper, middle, and lower lobes. Nonocclusive thrombus identified in segmental branches to the left upper lobe and left lower lobe. RV/LV ratio is 1.0. Mediastinum/Nodes: No mediastinal lymphadenopathy. There is no hilar lymphadenopathy. The esophagus has normal imaging features. There is no axillary lymphadenopathy. Lungs/Pleura: Ground-glass and consolidative opacity in the posterior right lower lobe noted with some atelectasis at the left base. No suspicious pulmonary nodule or mass. No pleural effusion. Musculoskeletal: No worrisome lytic or sclerotic osseous abnormality. Review of the MIP images confirms the above findings. CT ABDOMEN and PELVIS FINDINGS Hepatobiliary: No suspicious focal abnormality within the liver parenchyma. There is no evidence for gallstones, gallbladder wall thickening, or pericholecystic fluid. No intrahepatic or extrahepatic biliary dilation. Pancreas: No focal mass lesion. No dilatation of the  main duct. No intraparenchymal cyst. No peripancreatic edema. Spleen: 9 mm hypodensity identified in the spleen. This too small to characterize but is statistically a benign finding. No followup imaging is recommended. Adrenals/Urinary Tract: No adrenal nodule or mass. Right kidney unremarkable. No substantial right hydroureteronephrosis although there appears to be a 1 mm mid right ureteral stone on axial 50/3 and coronal 92/6. It is possible this finding could be a phlebolith. Tiny hypodensity in the left kidney is too small to characterize but likely benign. No followup imaging is recommended. Central sinus cysts noted lower pole left kidney. No followup imaging is recommended. No evidence for hydroureter. The urinary bladder appears normal for the degree of distention. Stomach/Bowel: Stomach is unremarkable. No gastric wall thickening. No evidence of outlet obstruction. Duodenum is normally positioned as is the ligament of Treitz. No small bowel wall thickening. No small bowel dilatation. The terminal ileum is normal. The appendix is normal. No gross colonic mass. No colonic wall thickening. Vascular/Lymphatic: There is mild atherosclerotic calcification of the abdominal aorta without aneurysm. There is no gastrohepatic or hepatoduodenal ligament lymphadenopathy. No retroperitoneal or mesenteric lymphadenopathy. No pelvic sidewall lymphadenopathy. Reproductive: Unremarkable. Other: No intraperitoneal free fluid. Musculoskeletal: No worrisome lytic or sclerotic osseous abnormality. Review of the MIP images confirms the above findings. IMPRESSION: 1. Relatively large volume pulmonary embolus bilaterally with nearly occlusive thrombus in lobar branches to the right upper, middle, and lower lobes. Nonocclusive thrombus identified in segmental branches to the left upper lobe and left lower lobe. RV/LV ratio is 1.0. Positive for acute PE with CT evidence of right heart strain (RV/LV Ratio = 1.0) consistent with at  least submassive (intermediate risk) PE. The presence of right heart strain has been associated with an increased risk of morbidity and mortality. Please refer to the "Code PE Focused" order set in EPIC. 2. Ground-glass and consolidative opacity in the posterior right lower lobe. Evolving pulmonary infarct not excluded. 3. Probable 1 mm mid right ureteral stone without appreciable right hydroureteronephrosis. No other urinary stone disease evident. 4.  Aortic Atherosclerosis (ICD10-I70.0). Critical Value/emergent results were called by telephone at the time of interpretation on 09/18/2022 at 5:44 am to provider [facility] , who verbally acknowledged these results. Electronically Signed   By: Kennith Center M.D.   On: 09/18/2022 05:44   DG Chest 2 View  Result Date: 09/17/2022 CLINICAL DATA:  Chest pain. EXAM: CHEST - 2 VIEW COMPARISON:  Chest  radiograph dated 07/22/2022 FINDINGS: The lungs are clear. There is no pleural effusion or pneumothorax. The cardiac silhouette is within normal limits. Degenerative changes of the spine. No acute osseous pathology. IMPRESSION: No active cardiopulmonary disease. Electronically Signed   By: Elgie Collard M.D.   On: 09/17/2022 22:12    Pending Labs Unresulted Labs (From admission, onward)     Start     Ordered   09/25/22 0500  Creatinine, serum  (enoxaparin (LOVENOX)    CrCl >/= 30 ml/min)  Weekly,   R     Comments: while on enoxaparin therapy    09/18/22 0848   09/19/22 0500  CBC  Daily,   R      09/18/22 0557   09/19/22 0500  Heparin level (unfractionated)  Daily,   R      09/18/22 0844   09/19/22 0500  CBC  Tomorrow morning,   R        09/18/22 0848   09/19/22 0500  Comprehensive metabolic panel  Tomorrow morning,   R        09/18/22 0848   09/18/22 1300  Heparin level (unfractionated)  Once-Timed,   URGENT        09/18/22 0554   09/18/22 1010  CBC  Once,   R        09/18/22 1010   09/18/22 0849  HIV Antibody (routine testing w rflx)  (HIV  Antibody (Routine testing w reflex) panel)  Once,   R        09/18/22 0848            Vitals/Pain Today's Vitals   09/18/22 0609 09/18/22 0657 09/18/22 0815 09/18/22 0830  BP: (!) 141/71 115/67  132/80  Pulse: 84 74  77  Resp: (!) 24 12 10    Temp:  (!) 97 F (36.1 C)    TempSrc:      SpO2: 98% 91%  98%  Weight:      Height:      PainSc: 10-Worst pain ever       Isolation Precautions No active isolations  Medications Medications  HYDROmorphone (DILAUDID) injection 0.5 mg (0.5 mg Intravenous Given 09/18/22 0621)  ondansetron (ZOFRAN) injection 4 mg (4 mg Intravenous Not Given 09/18/22 1914)  heparin ADULT infusion 100 units/mL (25000 units/230mL) (1,250 Units/hr Intravenous New Bag/Given 09/18/22 0614)  0.9 %  sodium chloride infusion (has no administration in time range)  acetaminophen (TYLENOL) tablet 650 mg (has no administration in time range)    Or  acetaminophen (TYLENOL) suppository 650 mg (has no administration in time range)  hydrALAZINE (APRESOLINE) tablet 25 mg (has no administration in time range)  oxyCODONE-acetaminophen (PERCOCET/ROXICET) 5-325 MG per tablet 1 tablet (1 tablet Oral Given 09/18/22 0059)  iohexol (OMNIPAQUE) 350 MG/ML injection 75 mL (75 mLs Intravenous Contrast Given 09/18/22 0457)  heparin bolus via infusion 5,000 Units (5,000 Units Intravenous Bolus from Bag 09/18/22 0614)    Mobility walks     Focused Assessments Pulmonary Assessment Handoff:  Lung sounds:   O2 Device: Nasal Cannula O2 Flow Rate (L/min): 2 L/min    R Recommendations: See Admitting Provider Note  Report given to:   Additional Notes: COVID +

## 2022-09-18 NOTE — Progress Notes (Signed)
Discussed findings of CTA chest with PCCM, Dr. Monica Becton.  He recommends to continue with heparin for 24 to 48 hours and then switch to DOACs.  In the meantime if patient develops hemodynamic compromise, consult PCCM for thrombolysis.

## 2022-09-18 NOTE — ED Notes (Signed)
Pt sat 92%, placed on 2L [place] and sat up to 98%.

## 2022-09-18 NOTE — TOC Benefit Eligibility Note (Signed)
Pharmacy Patient Advocate Encounter  Insurance verification completed.    The patient is insured through [employer].   Ran test claim for Eliquis and the current 30 day co-pay is $60.00.   This test claim was processed through [facility]- copay amounts may vary at other pharmacies due to pharmacy/plan contracts, or as the patient moves through the different stages of their insurance plan.

## 2022-09-18 NOTE — Progress Notes (Incomplete)
ANTICOAGULATION CONSULT NOTE - Initial Consult  Pharmacy Consult for heparin Indication: pulmonary embolus  No Known Allergies  Patient Measurements: Height: 5\' 2"  (157.5 cm) Weight: 90.4 kg (199 lb 4.7 oz) IBW/kg (Calculated) : 50.1 Heparin Dosing Weight: 71 kg  Vital Signs: Temp: 97.7 F (36.5 C) (08/16 1310) Temp Source: Tympanic (08/16 1310) BP: 136/71 (08/16 1310) Pulse Rate: 71 (08/16 1310)  Labs: Recent Labs    09/17/22 2122 09/17/22 2325 09/18/22 0857  HGB 12.1  --   --   HCT 37.2  --   --   PLT 279  --   --   CREATININE 1.03*  --  0.88  TROPONINIHS 18* 21*  --     Estimated Creatinine Clearance: 67.5 mL/min (by C-G formula based on SCr of 0.88 mg/dL).   Medical History: Past Medical History:  Diagnosis Date   Chicken pox    Hypertension    Dx 2009-2010   Kidney stones    Osteopenia    DEXA 11/22/08   Varicose veins of bilateral lower extremities with pain    Assessment: 41 yoF presented to the ED with intermittent chest pain and SOB for 3 days. CTA showed bilateral pulmonary emboli with evidence of right heart strain. Pharmacy has been consulted to dose heparin for PE. CBC stable and no PTA anticoagulation   Goal of Therapy:  Heparin level 0.3-0.7 units/ml Monitor platelets by anticoagulation protocol: Yes   Plan:  Give 5000 units bolus x 1 Start heparin infusion at 1250 units/hr Check anti-Xa level in 6 hours and daily while on heparin Continue to monitor H&H and platelets  Jani Gravel, PharmD Clinical Pharmacist  09/18/2022 1:19 PM

## 2022-09-18 NOTE — Progress Notes (Signed)
ANTICOAGULATION CONSULT NOTE - Initial Consult  Pharmacy Consult for heparin Indication: pulmonary embolus  No Known Allergies  Patient Measurements: Height: 5\' 2"  (157.5 cm) Weight: 90.4 kg (199 lb 4.7 oz) IBW/kg (Calculated) : 50.1 Heparin Dosing Weight: 71 kg  Vital Signs: Temp: 98.4 F (36.9 C) (08/16 1637) Temp Source: Oral (08/16 1637) BP: 119/101 (08/16 1637) Pulse Rate: 72 (08/16 1637)  Labs: Recent Labs    09/17/22 2122 09/17/22 2325 09/18/22 0857 09/18/22 1340 09/18/22 1813  HGB 12.1  --   --  11.4*  --   HCT 37.2  --   --  34.8*  --   PLT 279  --   --  271  --   HEPARINUNFRC  --   --   --  1.05* 0.97*  CREATININE 1.03*  --  0.88  --   --   TROPONINIHS 18* 21*  --   --   --     Estimated Creatinine Clearance: 67.5 mL/min (by C-G formula based on SCr of 0.88 mg/dL).   Medical History: Past Medical History:  Diagnosis Date   Chicken pox    Hypertension    Dx 2009-2010   Kidney stones    Osteopenia    DEXA 11/22/08   Varicose veins of bilateral lower extremities with pain    Assessment: Cheyenne Hancock presented to the ED with intermittent chest pain and SOB for 3 days. CTA showed bilateral pulmonary emboli with evidence of right heart strain. Pharmacy has been consulted to dose heparin for PE. CBC stable and no PTA anticoagulation.  8/16 PM Update: Heparin level on re-draw 0.97 (supratherapeutic) on 1250 units/hr (first level drawn from same arm as heparin infusion).  Confirmed second draw was drawn appropriately.  No infusion issues or bleeding noted.  Goal of Therapy:  Heparin level 0.3-0.7 units/ml Monitor platelets by anticoagulation protocol: Yes   Plan:  Decrease heparin infusion to 1100 units/hr Check anti-Xa level in 6 hours and daily while on heparin Continue to monitor H&H and platelets F/u transition to DOAC  Trixie Rude, PharmD Clinical Pharmacist 09/18/2022  7:34 PM  Please check AMION for all [facility] Pharmacy phone numbers After 10:00 PM,  call Main Pharmacy [phone]

## 2022-09-19 DIAGNOSIS — I2699 Other pulmonary embolism without acute cor pulmonale: Secondary | ICD-10-CM | POA: Diagnosis not present

## 2022-09-19 LAB — HEPARIN LEVEL (UNFRACTIONATED)
Heparin Unfractionated: 0.74 [IU]/mL — ABNORMAL HIGH (ref 0.30–0.70)
Heparin Unfractionated: 0.7 [IU]/mL (ref 0.30–0.70)
Heparin Unfractionated: 0.39 [IU]/mL (ref 0.30–0.70)

## 2022-09-19 LAB — CBC
HCT: 34 % — ABNORMAL LOW (ref 36.0–46.0)
Hemoglobin: 11.1 g/dL — ABNORMAL LOW (ref 12.0–15.0)
MCH: 28.5 pg (ref 26.0–34.0)
MCHC: 32.6 g/dL (ref 30.0–36.0)
MCV: 87.2 fL (ref 80.0–100.0)
Platelets: 278 10*3/uL (ref 150–400)
RBC: 3.9 MIL/uL (ref 3.87–5.11)
RDW: 13.3 % (ref 11.5–15.5)
WBC: 7.9 10*3/uL (ref 4.0–10.5)
nRBC: 0 % (ref 0.0–0.2)

## 2022-09-19 LAB — COMPREHENSIVE METABOLIC PANEL
ALT: 16 U/L (ref 0–44)
AST: 17 U/L (ref 15–41)
Albumin: 3.1 g/dL — ABNORMAL LOW (ref 3.5–5.0)
Alkaline Phosphatase: 59 U/L (ref 38–126)
Anion gap: 11 (ref 5–15)
BUN: <5 mg/dL — ABNORMAL LOW (ref 8–23)
CO2: 23 mmol/L (ref 22–32)
Calcium: 8.7 mg/dL — ABNORMAL LOW (ref 8.9–10.3)
Chloride: 100 mmol/L (ref 98–111)
Creatinine, Ser: 0.87 mg/dL (ref 0.44–1.00)
GFR, Estimated: >60 mL/min (ref >60)
Glucose, Bld: 101 mg/dL — ABNORMAL HIGH (ref 70–99)
Potassium: 3.7 mmol/L (ref 3.5–5.1)
Sodium: 134 mmol/L — ABNORMAL LOW (ref 135–145)
Total Bilirubin: 0.5 mg/dL (ref 0.3–1.2)
Total Protein: 7 g/dL (ref 6.5–8.1)

## 2022-09-19 MED ORDER — FAMOTIDINE 20 MG PO TABS
20.0000 mg | ORAL_TABLET | Freq: Every day | ORAL | Status: DC
  Filled 2022-09-19: qty 1

## 2022-09-19 MED ORDER — KETOROLAC TROMETHAMINE 30 MG/ML IJ SOLN
30.0000 mg | Freq: Once | INTRAMUSCULAR | Status: AC
  Administered 2022-09-19: 30 mg via INTRAVENOUS
  Filled 2022-09-19: qty 1

## 2022-09-19 MED ORDER — KETOROLAC TROMETHAMINE 30 MG/ML IJ SOLN: 30.0000 mg | Freq: Three times a day (TID) | INTRAMUSCULAR | Status: DC | PRN

## 2022-09-19 MED ORDER — IBUPROFEN 200 MG PO TABS
600.0000 mg | ORAL_TABLET | Freq: Four times a day (QID) | ORAL | Status: DC | PRN
  Filled 2022-09-19: qty 3

## 2022-09-19 NOTE — Plan of Care (Signed)
  Problem: Health Behavior/Discharge Planning: Goal: Ability to manage health-related needs will improve Outcome: Progressing   Problem: Clinical Measurements: Goal: Diagnostic test results will improve Outcome: Progressing Goal: Respiratory complications will improve Outcome: Progressing Goal: Cardiovascular complication will be avoided Outcome: Progressing   Problem: Activity: Goal: Risk for activity intolerance will decrease Outcome: Progressing   Problem: Elimination: Goal: Will not experience complications related to bowel motility Outcome: Progressing Goal: Will not experience complications related to urinary retention Outcome: Progressing   Problem: Safety: Goal: Ability to remain free from injury will improve Outcome: Progressing

## 2022-09-19 NOTE — Progress Notes (Addendum)
ANTICOAGULATION CONSULT NOTE  Pharmacy Consult for heparin Indication: pulmonary embolus  No Known Allergies  Patient Measurements: Height: 5\' 2"  (157.5 cm) Weight: 90.4 kg (199 lb 4.7 oz) IBW/kg (Calculated) : 50.1 Heparin Dosing Weight: 71 kg  Vital Signs: Temp: 98.8 F (37.1 C) (08/17 0448) Temp Source: Oral (08/17 0448) BP: 111/60 (08/17 0536) Pulse Rate: 80 (08/17 0448)  Labs: Recent Labs    09/17/22 2122 09/17/22 2122 09/17/22 2325 09/18/22 0857 09/18/22 1340 09/18/22 1813 09/19/22 0154 09/19/22 0558  HGB 12.1  --   --   --  11.4*  --  11.1*  --   HCT 37.2  --   --   --  34.8*  --  34.0*  --   PLT 279  --   --   --  271  --  278  --   HEPARINUNFRC  --    < >  --   --  1.05* 0.97* 0.74* 0.70  CREATININE 1.03*  --   --  0.88  --   --  0.87  --   TROPONINIHS 18*  --  21*  --   --   --   --   --    < > = values in this interval not displayed.    Estimated Creatinine Clearance: 68.3 mL/min (by C-G formula based on SCr of 0.87 mg/dL).   Medical History: Past Medical History:  Diagnosis Date   Chicken pox    Hypertension    Dx 2009-2010   Kidney stones    Osteopenia    DEXA 11/22/08   Varicose veins of bilateral lower extremities with pain    Assessment: Cheyenne Hancock presented to the ED with intermittent chest pain and SOB for 3 days. CTA showed bilateral pulmonary emboli with evidence of right heart strain. Pharmacy has been consulted to dose heparin for PE. CBC stable and no PTA anticoagulation.  Heparin level came back therapeutic at 0.39 on heparin infusion at 950 units/hr. Hgb 11.1, plt 278. No s/sx of bleeding or infusion issues. Will transition to daily heparin levels as the last two measurements have been within goal range at 0.70 and 0.39. RN aware to message pharmacy with any concerns. No concerns at this time.   Goal of Therapy:  Heparin level 0.3-0.7 units/ml Monitor platelets by anticoagulation protocol: Yes   Plan:  Continue heparin infusion to  at 950 units/hr Check anti-Xa level daily while on heparin Continue to monitor H&H and platelets F/u transition to DOAC  Thank you for allowing pharmacy to participate in this patient's care,  Blane Ohara, PharmD  PGY2 Pharmacy Resident

## 2022-09-19 NOTE — Progress Notes (Signed)
ANTICOAGULATION CONSULT NOTE  Pharmacy Consult for heparin Indication: pulmonary embolus  No Known Allergies  Patient Measurements: Height: 5\' 2"  (157.5 cm) Weight: 90.4 kg (199 lb 4.7 oz) IBW/kg (Calculated) : 50.1 Heparin Dosing Weight: 71 kg  Vital Signs: Temp: 98 F (36.7 C) (08/16 2027) Temp Source: Oral (08/16 2027) BP: 124/65 (08/16 2027) Pulse Rate: 90 (08/16 2027)  Labs: Recent Labs    09/17/22 2122 09/17/22 2325 09/18/22 0857 09/18/22 1340 09/18/22 1813 09/19/22 0154  HGB 12.1  --   --  11.4*  --  11.1*  HCT 37.2  --   --  34.8*  --  34.0*  PLT 279  --   --  271  --  278  HEPARINUNFRC  --   --   --  1.05* 0.97* 0.74*  CREATININE 1.03*  --  0.88  --   --  0.87  TROPONINIHS 18* 21*  --   --   --   --     Estimated Creatinine Clearance: 68.3 mL/min (by C-G formula based on SCr of 0.87 mg/dL).   Medical History: Past Medical History:  Diagnosis Date   Chicken pox    Hypertension    Dx 2009-2010   Kidney stones    Osteopenia    DEXA 11/22/08   Varicose veins of bilateral lower extremities with pain    Assessment: 61 yoF presented to the ED with intermittent chest pain and SOB for 3 days. CTA showed bilateral pulmonary emboli with evidence of right heart strain. Pharmacy has been consulted to dose heparin for PE. CBC stable and no PTA anticoagulation.  Heparin level came back slightly supratherapeutic at 0.74, on heparin infusion at 1100 units/hr. Hgb 11.1, plt 278. No s/sx of bleeding or infusion issues.   Goal of Therapy:  Heparin level 0.3-0.7 units/ml Monitor platelets by anticoagulation protocol: Yes   Plan:  Decrease heparin infusion to 950 units/hr Check anti-Xa level in 6 hours and daily while on heparin Continue to monitor H&H and platelets F/u transition to DOAC  Thank you for allowing pharmacy to participate in this patient's care,  Sherron Monday, PharmD, BCCCP Clinical Pharmacist  Phone: [phone] 09/19/2022 3:17 AM  Please check  AMION for all [facility] Pharmacy phone numbers After 10:00 PM, call Main Pharmacy [phone]

## 2022-09-19 NOTE — Progress Notes (Signed)
[facility]  PROGRESS NOTE  Cheyenne Hancock NWG:[id number] DOB: 1960-05-02 DOA: 09/17/2022 PCP: [place], Betty G, MD   Brief HPI:   62 y.o. female, with no significant medical problems presented to the hospital with chest pain and shortness of breath.  Patient says that she had runny nose with upper respiratory symptoms last week, did not have sore throat, no cough did not cough up any phlegm, no shortness of breath.  She felt that this was like allergies.  But 2 days ago she started having shortness of breath on exertion also started having pain in the right flank area with radiation towards upper abdomen.  Has been experiencing central chest pain. In the ED CTA chest showed pulmonary embolism with right heart strain.  Patient started on heparin.  Also found to have SARS-CoV 2 RT-PCR positive. Chest x-ray is clear, no infiltrate noted.    Assessment/Plan:   Acute submassive pulmonary embolism with right heart strain -Started on IV heparin per pharmacy -Venous duplex of lower extremities negative for DVT bilaterally -Echocardiogram obtained showed EF of 65 to 70%, no regional wall motion abnormalities, right ventricular systolic function is low normal -If she develops hemodynamic compromise, will consult PCCM for thrombolysis -Continue with heparin for 48 hours total and then switch to DOAC's  Pleuritic chest pain -Will give 1 dose of Toradol 30 mg IV  COVID-19 infection-found to be positive with SARS 2 COVID RT-PCR, chest x-ray clear.  No indication to use antiviral treatment.    COVID-19 precautions.   ureteral stone-1 mm right ureteral stone noted on CT chest.  No hydronephrosis noted.  Should pass off its own.  Discussed with urology, Dr. Annabell Howells.  Patient can follow-up with urology as outpatient.     Medications      Data Reviewed:   CBG:  No results for input(s): "GLUCAP" in the last 168 hours.  SpO2: 92 % O2 Flow Rate (L/min): 2 L/min FiO2 (%): 98 %     Vitals:   09/18/22 1637 09/18/22 2027 09/19/22 0448 09/19/22 0536  BP: (!) 119/101 124/65 (!) 110/58 111/60  Pulse: 72 90 80   Resp: 18 18 18 18   Temp: 98.4 F (36.9 C) 98 F (36.7 C) 98.8 F (37.1 C)   TempSrc: Oral Oral Oral   SpO2: 96% 94% (!) 86% 92%  Weight:      Height:          Data Reviewed:  Basic Metabolic Panel: Recent Labs  Lab 09/17/22 2122 09/18/22 0857 09/19/22 0154  NA 136  --  134*  K 3.9  --  3.7  CL 99  --  100  CO2 24  --  23  GLUCOSE 113*  --  101*  BUN 5*  --  <5*  CREATININE 1.03* 0.88 0.87  CALCIUM 9.5  --  8.7*    CBC: Recent Labs  Lab 09/17/22 2122 09/18/22 1340 09/19/22 0154  WBC 10.4 9.9 7.9  HGB 12.1 11.4* 11.1*  HCT 37.2 34.8* 34.0*  MCV 87.3 86.6 87.2  PLT 279 271 278    LFT Recent Labs  Lab 09/19/22 0154  AST 17  ALT 16  ALKPHOS 59  BILITOT 0.5  PROT 7.0  ALBUMIN 3.1*     Antibiotics: Anti-infectives (From admission, onward)    None        DVT prophylaxis: Full dose heparin  Code Status: Full code  Family Communication: No family at bedside   CONSULTS    Subjective   Complains  of right lower chest wall pain, especially on deep breathing   Objective    Physical Examination:   General-appears in no acute distress Heart-S1-S2, regular, no murmur auscultated Lungs-clear to auscultation bilaterally, no wheezing or crackles auscultated Abdomen-soft, nontender, no organomegaly Extremities-no edema in the lower extremities Neuro-alert, oriented x3, no focal deficit noted  Status is: Inpatient:             Meredeth Ide   [facility] If 7PM-7AM, please contact night-coverage at [email or web address], Office  [phone]   09/19/2022, 9:53 AM  LOS: 1 day

## 2022-09-20 ENCOUNTER — Telehealth: Payer: Self-pay | Admitting: Internal Medicine

## 2022-09-20 ENCOUNTER — Inpatient Hospital Stay (HOSPITAL_COMMUNITY): Payer: Federal, State, Local not specified - PPO

## 2022-09-20 DIAGNOSIS — I2699 Other pulmonary embolism without acute cor pulmonale: Secondary | ICD-10-CM | POA: Diagnosis not present

## 2022-09-20 LAB — CBC
HCT: 34.2 % — ABNORMAL LOW (ref 36.0–46.0)
Hemoglobin: 11.2 g/dL — ABNORMAL LOW (ref 12.0–15.0)
MCH: 28.1 pg (ref 26.0–34.0)
MCHC: 32.7 g/dL (ref 30.0–36.0)
MCV: 85.7 fL (ref 80.0–100.0)
Platelets: 331 10*3/uL (ref 150–400)
RBC: 3.99 MIL/uL (ref 3.87–5.11)
RDW: 13 % (ref 11.5–15.5)
WBC: 6.8 10*3/uL (ref 4.0–10.5)
nRBC: 0 % (ref 0.0–0.2)

## 2022-09-20 LAB — BRAIN NATRIURETIC PEPTIDE: B Natriuretic Peptide: 121.6 pg/mL — ABNORMAL HIGH (ref 0.0–100.0)

## 2022-09-20 LAB — LACTIC ACID, PLASMA: Lactic Acid, Venous: 1 mmol/L (ref 0.5–1.9)

## 2022-09-20 LAB — D-DIMER, QUANTITATIVE: D-Dimer, Quant: 2.23 ug{FEU}/mL — ABNORMAL HIGH (ref 0.00–0.50)

## 2022-09-20 LAB — TROPONIN I (HIGH SENSITIVITY): Troponin I (High Sensitivity): 10 ng/L (ref <18)

## 2022-09-20 LAB — HEPARIN LEVEL (UNFRACTIONATED): Heparin Unfractionated: 0.29 [IU]/mL — ABNORMAL LOW (ref 0.30–0.70)

## 2022-09-20 MED ORDER — APIXABAN 5 MG PO TABS: 10.0000 mg | ORAL_TABLET | Freq: Two times a day (BID) | ORAL | 0 refills | Status: DC

## 2022-09-20 MED ORDER — APIXABAN 5 MG PO TABS
10.0000 mg | ORAL_TABLET | Freq: Two times a day (BID) | ORAL | Status: DC
  Administered 2022-09-20: 10 mg via ORAL
  Filled 2022-09-20: qty 2

## 2022-09-20 MED ORDER — APIXABAN 5 MG PO TABS: 5.0000 mg | ORAL_TABLET | Freq: Two times a day (BID) | ORAL | 3 refills | Status: DC

## 2022-09-20 NOTE — Discharge Summary (Addendum)
Physician Discharge Summary   Patient: Cheyenne Hancock MRN: [id number] DOB: 1960-02-21  Admit date:     09/17/2022  Discharge date: 09/20/22  Discharge Physician: Meredeth Ide   PCP: [place], Betty G, MD   Recommendations at discharge:   Follow-up pulmonology as outpatient Follow-up with urology as outpatient   Discharge Diagnoses: Principal Problem:   Pulmonary embolism (HCC) COVID-19 infection Right 1 mm ureteral stone  Resolved Problems:   * No resolved hospital problems. *  Hospital Course: 62 y.o. female, with no significant medical problems presented to the hospital with chest pain and shortness of breath.  Patient says that she had runny nose with upper respiratory symptoms last week, did not have sore throat, no cough did not cough up any phlegm, no shortness of breath.  She felt that this was like allergies.  But 2 days ago she started having shortness of breath on exertion also started having pain in the right flank area with radiation towards upper abdomen.  Has been experiencing central chest pain. In the ED CTA chest showed pulmonary embolism with right heart strain.  Patient started on heparin.  Also found to have SARS-CoV 2 RT-PCR positive. Chest x-ray is clear, no infiltrate noted.  Assessment and Plan:  Acute submassive pulmonary embolism with right heart strain -Started on IV heparin per pharmacy -Venous duplex of lower extremities negative for DVT bilaterally -Echocardiogram obtained showed EF of 65 to 70%, no regional wall motion abnormalities, right ventricular systolic function is low normal -Ambulation done in the room, O2 sats maintained at 96% on room air -Discussed with pulmonology, Dr. Marchelle Gearing, patient can be discharged on Eliquis and will follow-up with pulmonology as outpatient.  She will need to be on anticoagulation at least for 6 months.    Pleuritic chest pain -Resolved after patient got Toradol -Can take Motrin as needed for pain    COVID-19 infection-found to be positive with SARS 2 COVID RT-PCR, chest x-ray clear.  No indication to use antiviral treatment.   Did not require oxygen, O2 sats 96% on room air.   ureteral stone-1 mm right ureteral stone noted on CT chest.  No hydronephrosis noted.  Should pass off its own.  Discussed with urology, Dr. Annabell Howells.  Patient can follow-up with urology as outpatient.         Consultants:  Procedures performed:  Disposition: Home Diet recommendation:  Discharge Diet Orders (From admission, onward)     Start     Ordered   09/20/22 0000  Diet - low sodium heart healthy        09/20/22 1035           Regular diet DISCHARGE MEDICATION: Allergies as of 09/20/2022   No Known Allergies      Medication List     TAKE these medications    acetaminophen 500 MG tablet Commonly known as: TYLENOL Take 500 mg by mouth daily as needed for mild pain or moderate pain.   apixaban 5 MG Tabs tablet Commonly known as: ELIQUIS Take 2 tablets (10 mg total) by mouth 2 (two) times daily for 7 days.   apixaban 5 MG Tabs tablet Commonly known as: ELIQUIS Take 1 tablet (5 mg total) by mouth 2 (two) times daily. Start taking on: September 28, 2022        Follow-up Information     [place], Betty G, MD Follow up.   Specialty: Family Medicine Contact information: [address] [place] [address] [phone]  Kalman Shan, MD. Schedule an appointment as soon as possible for a visit.   Specialty: Pulmonary Disease Contact information: [address] [place] [address] [phone]         Bjorn Pippin, MD. Schedule an appointment as soon as possible for a visit.   Specialty: Urology Why: For kidney stone Contact information: [address] [place] [address] [phone]                Discharge Exam: Ceasar Mons Weights   09/18/22 0506  Weight: 90.4 kg   General-appears in no acute distress Heart-S1-S2, regular, no  murmur auscultated Lungs-clear to auscultation bilaterally, no wheezing or crackles auscultated Abdomen-soft, nontender, no organomegaly Extremities-no edema in the lower extremities Neuro-alert, oriented x3, no focal deficit noted  Condition at discharge: good  The results of significant diagnostics from this hospitalization (including imaging, microbiology, ancillary and laboratory) are listed below for reference.   Imaging Studies: DG Chest Port 1V same Day  Result Date: 09/20/2022 CLINICAL DATA:  Pulmonary embolism. EXAM: PORTABLE CHEST 1 VIEW COMPARISON:  09/17/2022 FINDINGS: Lungs are somewhat hypoinflated and otherwise clear. Cardiomediastinal silhouette and remainder of the exam is unchanged. IMPRESSION: No acute cardiopulmonary disease. Electronically Signed   By: Elberta Fortis M.D.   On: 09/20/2022 10:18   VAS [place] LOWER EXTREMITY VENOUS (DVT)  Result Date: 09/18/2022  Lower Venous DVT Study Patient Name:  Cheyenne Hancock  Date of Exam:   09/18/2022 Medical Rec #: [id number]                  Accession #:    [id number] Date of Birth: March 26, 1960                  Patient Gender: F Patient Age:   76 years Exam Location:  [facility] Procedure:      VAS [place] LOWER EXTREMITY VENOUS (DVT) Referring Phys: Sibyl Parr Elic Vencill --------------------------------------------------------------------------------  Indications: Swelling, Edema, and pulmonary embolism.  Risk Factors: None identified. Anticoagulation: Heparin. Comparison Study: No significant changes seen since previous study 11/14/18 Performing Technologist: Shona Simpson  Examination Guidelines: A complete evaluation includes B-mode imaging, spectral Doppler, color Doppler, and power Doppler as needed of all accessible portions of each vessel. Bilateral testing is considered an integral part of a complete examination. Limited examinations for reoccurring indications may be performed as noted. The reflux portion of the exam is performed  with the patient in reverse Trendelenburg.  +---------+---------------+---------+-----------+----------+--------------+ RIGHT    CompressibilityPhasicitySpontaneityPropertiesThrombus Aging +---------+---------------+---------+-----------+----------+--------------+ CFV      Full           Yes      Yes                                 +---------+---------------+---------+-----------+----------+--------------+ SFJ      Full                                                        +---------+---------------+---------+-----------+----------+--------------+ FV Prox  Full                                                        +---------+---------------+---------+-----------+----------+--------------+  FV Mid   Full                                                        +---------+---------------+---------+-----------+----------+--------------+ FV DistalFull                                                        +---------+---------------+---------+-----------+----------+--------------+ PFV      Full                                                        +---------+---------------+---------+-----------+----------+--------------+ POP      Full           Yes      Yes                                 +---------+---------------+---------+-----------+----------+--------------+ PTV      Full                                                        +---------+---------------+---------+-----------+----------+--------------+ PERO     Full                                                        +---------+---------------+---------+-----------+----------+--------------+   +---------+---------------+---------+-----------+----------+--------------+ LEFT     CompressibilityPhasicitySpontaneityPropertiesThrombus Aging +---------+---------------+---------+-----------+----------+--------------+ CFV      Full           Yes      Yes                                  +---------+---------------+---------+-----------+----------+--------------+ SFJ      Full                                                        +---------+---------------+---------+-----------+----------+--------------+ FV Prox  Full                                                        +---------+---------------+---------+-----------+----------+--------------+ FV Mid   Full                                                        +---------+---------------+---------+-----------+----------+--------------+  FV DistalFull                                                        +---------+---------------+---------+-----------+----------+--------------+ PFV      Full                                                        +---------+---------------+---------+-----------+----------+--------------+ POP      Full           Yes      Yes                                 +---------+---------------+---------+-----------+----------+--------------+ PTV      Full                                                        +---------+---------------+---------+-----------+----------+--------------+ PERO     Full                                                        +---------+---------------+---------+-----------+----------+--------------+     Summary: BILATERAL: - No evidence of deep vein thrombosis seen in the lower extremities, bilaterally. -No evidence of popliteal cyst, bilaterally.   *See table(s) above for measurements and observations. Electronically signed by Lemar Livings MD on 09/18/2022 at 7:14:16 PM.    Final    ECHOCARDIOGRAM COMPLETE  Result Date: 09/18/2022    ECHOCARDIOGRAM REPORT   Patient Name:   Cheyenne Hancock Date of Exam: 09/18/2022 Medical Rec #:  [id number]                 Height:       62.0 in Accession #:    [id number]                Weight:       199.3 lb Date of Birth:  [birth year]                 BSA:          1.909 m Patient Age:    62 years                   BP:           136/71 mmHg Patient Gender: F                         HR:           66 bpm. Exam Location:  Inpatient Procedure: 2D Echo, Cardiac Doppler and Color Doppler Indications:    Pulmonary Embolus I26.09  History:        Patient has no prior history of Echocardiogram examinations.  Risk Factors:Hypertension and Dyslipidemia.  Sonographer:    Lucendia Herrlich Referring Phys: Gomez Cleverly Emilyrose Darrah IMPRESSIONS  1. Left ventricular ejection fraction, by estimation, is 65 to 70%. The left ventricle has normal function. The left ventricle has no regional wall motion abnormalities. Left ventricular diastolic parameters were normal.  2. Right ventricular systolic function is low normal. The right ventricular size is normal.  3. Right atrial size was mildly dilated.  4. The mitral valve is normal in structure. Trivial mitral valve regurgitation.  5. The aortic valve is tricuspid. Aortic valve regurgitation is mild.  6. The inferior vena cava is normal in size with greater than 50% respiratory variability, suggesting right atrial pressure of 3 mmHg. FINDINGS  Left Ventricle: Left ventricular ejection fraction, by estimation, is 65 to 70%. The left ventricle has normal function. The left ventricle has no regional wall motion abnormalities. The left ventricular internal cavity size was normal in size. There is  no left ventricular hypertrophy. Left ventricular diastolic parameters were normal. Right Ventricle: The right ventricular size is normal. Right vetricular wall thickness was not assessed. Right ventricular systolic function is low normal. Left Atrium: Left atrial size was normal in size. Right Atrium: Right atrial size was mildly dilated. Pericardium: The pericardium was not well visualized. Mitral Valve: The mitral valve is normal in structure. Trivial mitral valve regurgitation. Tricuspid Valve: The tricuspid valve is normal in structure. Tricuspid valve regurgitation is mild. Aortic  Valve: The aortic valve is tricuspid. Aortic valve regurgitation is mild. Aortic regurgitation PHT measures 591 msec. Aortic valve peak gradient measures 11.6 mmHg. Pulmonic Valve: The pulmonic valve was normal in structure. Pulmonic valve regurgitation is trivial. Aorta: The aortic root and ascending aorta are structurally normal, with no evidence of dilitation. Venous: The inferior vena cava is normal in size with greater than 50% respiratory variability, suggesting right atrial pressure of 3 mmHg. IAS/Shunts: No atrial level shunt detected by color flow Doppler.  LEFT VENTRICLE PLAX 2D LVIDd:         4.40 cm   Diastology LVIDs:         2.50 cm   LV e' medial:    8.70 cm/s LV PW:         0.70 cm   LV E/e' medial:  7.9 LV IVS:        0.70 cm   LV e' lateral:   13.20 cm/s LVOT diam:     1.80 cm   LV E/e' lateral: 5.2 LV SV:         57 LV SV Index:   30 LVOT Area:     2.54 cm  RIGHT VENTRICLE             IVC RV S prime:     14.30 cm/s  IVC diam: 1.40 cm TAPSE (M-mode): 2.0 cm LEFT ATRIUM             Index        RIGHT ATRIUM           Index LA diam:        3.30 cm 1.73 cm/m   RA Area:     18.90 cm LA Vol (A2C):   25.9 ml 13.57 ml/m  RA Volume:   53.20 ml  27.87 ml/m LA Vol (A4C):   30.9 ml 16.19 ml/m LA Biplane Vol: 30.8 ml 16.14 ml/m  AORTIC VALVE AV Area (Vmax): 1.71 cm AV Vmax:        170.00 cm/s AV Peak  Grad:   11.6 mmHg LVOT Vmax:      114.33 cm/s LVOT Vmean:     72.000 cm/s LVOT VTI:       0.225 m AI PHT:         591 msec  AORTA Ao Root diam: 2.60 cm Ao Asc diam:  2.40 cm MITRAL VALVE               TRICUSPID VALVE MV Area (PHT): 3.53 cm    TR Peak grad:   34.6 mmHg MV Decel Time: 215 msec    TR Vmax:        294.00 cm/s MR Peak grad: 42.5 mmHg MR Vmax:      326.00 cm/s  SHUNTS MV E velocity: 68.70 cm/s  Systemic VTI:  0.23 m MV A velocity: 70.20 cm/s  Systemic Diam: 1.80 cm MV E/A ratio:  0.98 Dietrich Pates MD Electronically signed by Dietrich Pates MD Signature Date/Time: 09/18/2022/4:18:20 PM    Final    CT  Angio Chest Pulmonary Embolism (PE) W or WO Contrast  Result Date: 09/18/2022 CLINICAL DATA:  Intermittent in central chest pain and shortness of breath. Low back pain. EXAM: CT ANGIOGRAPHY CHEST CT ABDOMEN AND PELVIS WITH CONTRAST TECHNIQUE: Multidetector CT imaging of the chest was performed using the standard protocol during bolus administration of intravenous contrast. Multiplanar CT image reconstructions and MIPs were obtained to evaluate the vascular anatomy. Multidetector CT imaging of the abdomen and pelvis was performed using the standard protocol during bolus administration of intravenous contrast. RADIATION DOSE REDUCTION: This exam was performed according to the departmental dose-optimization program which includes automated exposure control, adjustment of the mA and/or kV according to patient size and/or use of iterative reconstruction technique. CONTRAST:  75mL OMNIPAQUE IOHEXOL 350 MG/ML SOLN COMPARISON:  CT abdomen and pelvis without contrast 07/31/2007. FINDINGS: CTA CHEST FINDINGS Cardiovascular: The heart size is normal. No substantial pericardial effusion. Mild atherosclerotic calcification is noted in the wall of the thoracic aorta. Nonocclusive pulmonary embolus is identified in the right main pulmonary artery with nearly occlusive thrombus in lobar branches to the right upper, middle, and lower lobes. Nonocclusive thrombus identified in segmental branches to the left upper lobe and left lower lobe. RV/LV ratio is 1.0. Mediastinum/Nodes: No mediastinal lymphadenopathy. There is no hilar lymphadenopathy. The esophagus has normal imaging features. There is no axillary lymphadenopathy. Lungs/Pleura: Ground-glass and consolidative opacity in the posterior right lower lobe noted with some atelectasis at the left base. No suspicious pulmonary nodule or mass. No pleural effusion. Musculoskeletal: No worrisome lytic or sclerotic osseous abnormality. Review of the MIP images confirms the above  findings. CT ABDOMEN and PELVIS FINDINGS Hepatobiliary: No suspicious focal abnormality within the liver parenchyma. There is no evidence for gallstones, gallbladder wall thickening, or pericholecystic fluid. No intrahepatic or extrahepatic biliary dilation. Pancreas: No focal mass lesion. No dilatation of the main duct. No intraparenchymal cyst. No peripancreatic edema. Spleen: 9 mm hypodensity identified in the spleen. This too small to characterize but is statistically a benign finding. No followup imaging is recommended. Adrenals/Urinary Tract: No adrenal nodule or mass. Right kidney unremarkable. No substantial right hydroureteronephrosis although there appears to be a 1 mm mid right ureteral stone on axial 50/3 and coronal 92/6. It is possible this finding could be a phlebolith. Tiny hypodensity in the left kidney is too small to characterize but likely benign. No followup imaging is recommended. Central sinus cysts noted lower pole left kidney. No followup imaging is recommended. No evidence for  hydroureter. The urinary bladder appears normal for the degree of distention. Stomach/Bowel: Stomach is unremarkable. No gastric wall thickening. No evidence of outlet obstruction. Duodenum is normally positioned as is the ligament of Treitz. No small bowel wall thickening. No small bowel dilatation. The terminal ileum is normal. The appendix is normal. No gross colonic mass. No colonic wall thickening. Vascular/Lymphatic: There is mild atherosclerotic calcification of the abdominal aorta without aneurysm. There is no gastrohepatic or hepatoduodenal ligament lymphadenopathy. No retroperitoneal or mesenteric lymphadenopathy. No pelvic sidewall lymphadenopathy. Reproductive: Unremarkable. Other: No intraperitoneal free fluid. Musculoskeletal: No worrisome lytic or sclerotic osseous abnormality. Review of the MIP images confirms the above findings. IMPRESSION: 1. Relatively large volume pulmonary embolus bilaterally with  nearly occlusive thrombus in lobar branches to the right upper, middle, and lower lobes. Nonocclusive thrombus identified in segmental branches to the left upper lobe and left lower lobe. RV/LV ratio is 1.0. Positive for acute PE with CT evidence of right heart strain (RV/LV Ratio = 1.0) consistent with at least submassive (intermediate risk) PE. The presence of right heart strain has been associated with an increased risk of morbidity and mortality. Please refer to the "Code PE Focused" order set in EPIC. 2. Ground-glass and consolidative opacity in the posterior right lower lobe. Evolving pulmonary infarct not excluded. 3. Probable 1 mm mid right ureteral stone without appreciable right hydroureteronephrosis. No other urinary stone disease evident. 4.  Aortic Atherosclerosis (ICD10-I70.0). Critical Value/emergent results were called by telephone at the time of interpretation on 09/18/2022 at 5:44 am to provider [facility] , who verbally acknowledged these results. Electronically Signed   By: Kennith Center M.D.   On: 09/18/2022 05:44   CT ABDOMEN PELVIS W CONTRAST  Result Date: 09/18/2022 CLINICAL DATA:  Intermittent in central chest pain and shortness of breath. Low back pain. EXAM: CT ANGIOGRAPHY CHEST CT ABDOMEN AND PELVIS WITH CONTRAST TECHNIQUE: Multidetector CT imaging of the chest was performed using the standard protocol during bolus administration of intravenous contrast. Multiplanar CT image reconstructions and MIPs were obtained to evaluate the vascular anatomy. Multidetector CT imaging of the abdomen and pelvis was performed using the standard protocol during bolus administration of intravenous contrast. RADIATION DOSE REDUCTION: This exam was performed according to the departmental dose-optimization program which includes automated exposure control, adjustment of the mA and/or kV according to patient size and/or use of iterative reconstruction technique. CONTRAST:  75mL OMNIPAQUE IOHEXOL  350 MG/ML SOLN COMPARISON:  CT abdomen and pelvis without contrast 07/31/2007. FINDINGS: CTA CHEST FINDINGS Cardiovascular: The heart size is normal. No substantial pericardial effusion. Mild atherosclerotic calcification is noted in the wall of the thoracic aorta. Nonocclusive pulmonary embolus is identified in the right main pulmonary artery with nearly occlusive thrombus in lobar branches to the right upper, middle, and lower lobes. Nonocclusive thrombus identified in segmental branches to the left upper lobe and left lower lobe. RV/LV ratio is 1.0. Mediastinum/Nodes: No mediastinal lymphadenopathy. There is no hilar lymphadenopathy. The esophagus has normal imaging features. There is no axillary lymphadenopathy. Lungs/Pleura: Ground-glass and consolidative opacity in the posterior right lower lobe noted with some atelectasis at the left base. No suspicious pulmonary nodule or mass. No pleural effusion. Musculoskeletal: No worrisome lytic or sclerotic osseous abnormality. Review of the MIP images confirms the above findings. CT ABDOMEN and PELVIS FINDINGS Hepatobiliary: No suspicious focal abnormality within the liver parenchyma. There is no evidence for gallstones, gallbladder wall thickening, or pericholecystic fluid. No intrahepatic or extrahepatic biliary dilation. Pancreas: No focal mass lesion.  No dilatation of the main duct. No intraparenchymal cyst. No peripancreatic edema. Spleen: 9 mm hypodensity identified in the spleen. This too small to characterize but is statistically a benign finding. No followup imaging is recommended. Adrenals/Urinary Tract: No adrenal nodule or mass. Right kidney unremarkable. No substantial right hydroureteronephrosis although there appears to be a 1 mm mid right ureteral stone on axial 50/3 and coronal 92/6. It is possible this finding could be a phlebolith. Tiny hypodensity in the left kidney is too small to characterize but likely benign. No followup imaging is recommended.  Central sinus cysts noted lower pole left kidney. No followup imaging is recommended. No evidence for hydroureter. The urinary bladder appears normal for the degree of distention. Stomach/Bowel: Stomach is unremarkable. No gastric wall thickening. No evidence of outlet obstruction. Duodenum is normally positioned as is the ligament of Treitz. No small bowel wall thickening. No small bowel dilatation. The terminal ileum is normal. The appendix is normal. No gross colonic mass. No colonic wall thickening. Vascular/Lymphatic: There is mild atherosclerotic calcification of the abdominal aorta without aneurysm. There is no gastrohepatic or hepatoduodenal ligament lymphadenopathy. No retroperitoneal or mesenteric lymphadenopathy. No pelvic sidewall lymphadenopathy. Reproductive: Unremarkable. Other: No intraperitoneal free fluid. Musculoskeletal: No worrisome lytic or sclerotic osseous abnormality. Review of the MIP images confirms the above findings. IMPRESSION: 1. Relatively large volume pulmonary embolus bilaterally with nearly occlusive thrombus in lobar branches to the right upper, middle, and lower lobes. Nonocclusive thrombus identified in segmental branches to the left upper lobe and left lower lobe. RV/LV ratio is 1.0. Positive for acute PE with CT evidence of right heart strain (RV/LV Ratio = 1.0) consistent with at least submassive (intermediate risk) PE. The presence of right heart strain has been associated with an increased risk of morbidity and mortality. Please refer to the "Code PE Focused" order set in EPIC. 2. Ground-glass and consolidative opacity in the posterior right lower lobe. Evolving pulmonary infarct not excluded. 3. Probable 1 mm mid right ureteral stone without appreciable right hydroureteronephrosis. No other urinary stone disease evident. 4.  Aortic Atherosclerosis (ICD10-I70.0). Critical Value/emergent results were called by telephone at the time of interpretation on 09/18/2022 at 5:44 am  to provider [facility] , who verbally acknowledged these results. Electronically Signed   By: Kennith Center M.D.   On: 09/18/2022 05:44   DG Chest 2 View  Result Date: 09/17/2022 CLINICAL DATA:  Chest pain. EXAM: CHEST - 2 VIEW COMPARISON:  Chest radiograph dated 07/22/2022 FINDINGS: The lungs are clear. There is no pleural effusion or pneumothorax. The cardiac silhouette is within normal limits. Degenerative changes of the spine. No acute osseous pathology. IMPRESSION: No active cardiopulmonary disease. Electronically Signed   By: Elgie Collard M.D.   On: 09/17/2022 22:12    Microbiology: Results for orders placed or performed during the hospital encounter of 09/17/22  Resp panel by RT-PCR (RSV, Flu A&B, Covid) Anterior Nasal Swab     Status: Abnormal   Collection Time: 09/17/22  9:22 PM   Specimen: Anterior Nasal Swab  Result Value Ref Range Status   SARS Coronavirus 2 by RT PCR POSITIVE (A) NEGATIVE Final   Influenza A by PCR NEGATIVE NEGATIVE Final   Influenza B by PCR NEGATIVE NEGATIVE Final    Comment: (NOTE) The Xpert Xpress SARS-CoV-2/FLU/RSV plus assay is intended as an aid in the diagnosis of influenza from Nasopharyngeal swab specimens and should not be used as a sole basis for treatment. Nasal washings and aspirates are unacceptable for  Xpert Xpress SARS-CoV-2/FLU/RSV testing.  Fact Sheet for Patients: BloggerCourse.com  Fact Sheet for Healthcare Providers: SeriousBroker.it  This test is not yet approved or cleared by the [place] FDA and has been authorized for detection and/or diagnosis of SARS-CoV-2 by FDA under an Emergency Use Authorization (EUA). This EUA will remain in effect (meaning this test can be used) for the duration of the COVID-19 declaration under Section 564(b)(1) of the Act, 21 U.S.C. section 360bbb-3(b)(1), unless the authorization is terminated or revoked.     Resp Syncytial Virus  by PCR NEGATIVE NEGATIVE Final    Comment: (NOTE) Fact Sheet for Patients: BloggerCourse.com  Fact Sheet for Healthcare Providers: SeriousBroker.it  This test is not yet approved or cleared by the [place] FDA and has been authorized for detection and/or diagnosis of SARS-CoV-2 by FDA under an Emergency Use Authorization (EUA). This EUA will remain in effect (meaning this test can be used) for the duration of the COVID-19 declaration under Section 564(b)(1) of the Act, 21 U.S.C. section 360bbb-3(b)(1), unless the authorization is terminated or revoked.  Performed at [facility], [address] [place], [place] [address]     Labs: CBC: Recent Labs  Lab 09/17/22 2122 09/18/22 1340 09/19/22 0154 09/20/22 0356  WBC 10.4 9.9 7.9 6.8  HGB 12.1 11.4* 11.1* 11.2*  HCT 37.2 34.8* 34.0* 34.2*  MCV 87.3 86.6 87.2 85.7  PLT 279 271 278 331   Basic Metabolic Panel: Recent Labs  Lab 09/17/22 2122 09/18/22 0857 09/19/22 0154  NA 136  --  134*  K 3.9  --  3.7  CL 99  --  100  CO2 24  --  23  GLUCOSE 113*  --  101*  BUN 5*  --  <5*  CREATININE 1.03* 0.88 0.87  CALCIUM 9.5  --  8.7*   Liver Function Tests: Recent Labs  Lab 09/19/22 0154  AST 17  ALT 16  ALKPHOS 59  BILITOT 0.5  PROT 7.0  ALBUMIN 3.1*   CBG: No results for input(s): "GLUCAP" in the last 168 hours.  Discharge time spent: greater than 30 minutes.  Signed: Meredeth Ide, MD [facility] 09/20/2022

## 2022-09-20 NOTE — Discharge Instructions (Addendum)
Information on my medicine - ELIQUIS (apixaban)  This medication education was reviewed with me or my healthcare representative as part of my discharge preparation.   Why was Eliquis prescribed for you? Eliquis was prescribed to treat blood clots that may have been found in the veins of your legs (deep vein thrombosis) or in your lungs (pulmonary embolism) and to reduce the risk of them occurring again.  What do You need to know about Eliquis ? The starting dose is 10 mg (TWO 5 mg tablets) taken TWICE daily for the FIRST SEVEN (7) DAYS, then on 8/26  the dose is reduced to ONE 5 mg tablet taken TWICE daily.  Eliquis may be taken with or without food.   Try to take the dose about the same time in the morning and in the evening. If you have difficulty swallowing the tablet whole please discuss with your pharmacist how to take the medication safely.  Take Eliquis exactly as prescribed and DO NOT stop taking Eliquis without talking to the doctor who prescribed the medication.  Stopping may increase your risk of developing a new blood clot.  Refill your prescription before you run out.  After discharge, you should have regular check-up appointments with your healthcare provider that is prescribing your Eliquis.    What do you do if you miss a dose? If a dose of ELIQUIS is not taken at the scheduled time, take it as soon as possible on the same day and twice-daily administration should be resumed. The dose should not be doubled to make up for a missed dose.  Important Safety Information A possible side effect of Eliquis is bleeding. You should call your healthcare provider right away if you experience any of the following: Bleeding from an injury or your nose that does not stop. Unusual colored urine (red or dark brown) or unusual colored stools (red or black). Unusual bruising for unknown reasons. A serious fall or if you hit your head (even if there is no bleeding).  Some medicines  may interact with Eliquis and might increase your risk of bleeding or clotting while on Eliquis. To help avoid this, consult your healthcare provider or pharmacist prior to using any new prescription or non-prescription medications, including herbals, vitamins, non-steroidal anti-inflammatory drugs (NSAIDs) and supplements.  This website has more information on Eliquis (apixaban): http://www.eliquis.com/eliquis/home

## 2022-09-20 NOTE — Telephone Encounter (Signed)
Front desk  Cheyenne Hancock was in the hospital DC today 09/20/2022= for pulmonary embolism.  Pulmonary did not see the patient formally but outpatient pulmonary follow-up is requested.  Plan - Please give a new consult but it can be with anyone including nurse practitioner.  Preferably me but time is of essence if he can give a follow-up within a week or 2 that would be great if not 4 weeks is fine.

## 2022-09-20 NOTE — TOC Transition Note (Signed)
Transition of Care [facility]) - CM/SW Discharge Note   Patient Details  Name: Cheyenne Hancock MRN: [id number] Date of Birth: 11-20-1960  [facility]) CM/SW Contact:  Ronny Bacon, RN Phone Number: 09/20/2022, 10:50 AM   Clinical Narrative: patient is being discharged home today. Explained Eliquis 30 day free card and copay card. Cards given to floor nurse to give to patient at discharge.    Final next level of care: Home/Self Care Barriers to Discharge: No Barriers Identified   Patient Goals and CMS Choice      Discharge Placement                         Discharge Plan and Services Additional resources added to the After Visit Summary for                                       Social Determinants of Health (SDOH) Interventions SDOH Screenings   Food Insecurity: No Food Insecurity (09/18/2022)  Housing: Low Risk  (09/18/2022)  Transportation Needs: No Transportation Needs (09/18/2022)  Utilities: Not At Risk (09/18/2022)  Depression (PHQ2-9): Low Risk  (06/12/2022)  Financial Resource Strain: Patient Declined (06/12/2022)  Physical Activity: Sufficiently Active (06/12/2022)  Social Connections: Moderately Integrated (06/12/2022)  Stress: No Stress Concern Present (06/12/2022)  Tobacco Use: Low Risk  (09/18/2022)     Readmission Risk Interventions     No data to display

## 2022-09-20 NOTE — Progress Notes (Addendum)
ANTICOAGULATION CONSULT NOTE  Pharmacy Consult for heparin Indication: pulmonary embolus  No Known Allergies  Patient Measurements: Height: 5\' 2"  (157.5 cm) Weight: 90.4 kg (199 lb 4.7 oz) IBW/kg (Calculated) : 50.1 Heparin Dosing Weight: 71 kg  Vital Signs: Temp: 98.4 F (36.9 C) (08/18 0428) Temp Source: Oral (08/18 0428) BP: 137/77 (08/18 0428) Pulse Rate: 70 (08/18 0428)  Labs: Recent Labs    09/17/22 2122 09/17/22 2325 09/18/22 0857 09/18/22 1340 09/18/22 1813 09/19/22 0154 09/19/22 0558 09/19/22 1103 09/20/22 0356  HGB 12.1  --   --  11.4*  --  11.1*  --   --  11.2*  HCT 37.2  --   --  34.8*  --  34.0*  --   --  34.2*  PLT 279  --   --  271  --  278  --   --  331  HEPARINUNFRC  --   --   --  1.05*   < > 0.74* 0.70 0.39 0.29*  CREATININE 1.03*  --  0.88  --   --  0.87  --   --   --   TROPONINIHS 18* 21*  --   --   --   --   --   --   --    < > = values in this interval not displayed.    Estimated Creatinine Clearance: 68.3 mL/min (by C-G formula based on SCr of 0.87 mg/dL).   Medical History: Past Medical History:  Diagnosis Date   Chicken pox    Hypertension    Dx 2009-2010   Kidney stones    Osteopenia    DEXA 11/22/08   Varicose veins of bilateral lower extremities with pain    Assessment: 74 yoF presented to the ED with intermittent chest pain and SOB for 3 days. CTA showed bilateral pulmonary emboli with evidence of right heart strain. Pharmacy has been consulted to dose heparin for PE. CBC stable and no PTA anticoagulation.  Heparin level came back just below goal at 0.29 on heparin infusion at 950 units/hr. Hgb 11.2, plt 331. No s/sx of bleeding or infusion issues.   Goal of Therapy:  Heparin level 0.3-0.7 units/ml Monitor platelets by anticoagulation protocol: Yes   Plan:  Increase heparin infusion to 1000 units/hr to get into goal range Check anti-Xa level daily while on heparin Continue to monitor H&H and platelets F/u transition to  DOAC  Thank you for allowing pharmacy to participate in this patient's care,  Sherron Monday, PharmD, BCCCP Clinical Pharmacist  Phone: [phone] 09/20/2022 5:06 AM  Please check AMION for all [facility] Pharmacy phone numbers After 10:00 PM, call Main Pharmacy [phone]

## 2022-09-21 ENCOUNTER — Telehealth: Payer: Self-pay | Admitting: Family Medicine

## 2022-09-21 NOTE — Telephone Encounter (Signed)
Use the 11:30 on 8/21

## 2022-09-21 NOTE — Telephone Encounter (Signed)
Pt scheduled for hospital f/u 10/02/22. Was discharged 09/20/22. Concerned that the appointment is so far out, says she is on blood thinners. Scheduled in first available

## 2022-09-22 ENCOUNTER — Telehealth: Payer: Self-pay

## 2022-09-22 NOTE — Transitions of Care (Post Inpatient/ED Visit) (Signed)
09/22/2022  Name: Cheyenne Hancock MRN: [id number] DOB: Sep 19, 1960  Today's TOC FU Call Status: Today's TOC FU Call Status:: Successful TOC FU Call Completed TOC FU Call Complete Date: 09/22/22  Transition Care Management Follow-up Telephone Call Date of Discharge: 09/20/22 Discharge Facility: [facility]) Type of Discharge: Inpatient Admission Primary Inpatient Discharge Diagnosis:: "PE" How have you been since you were released from the hospital?: Better (Pt voices that she is 'doing well." She does get a "a little short-winded with exertion-relieved with resting." Appetite good. LBM yest.) Any questions or concerns?: No  Items Reviewed: Did you receive and understand the discharge instructions provided?: Yes Medications obtained,verified, and reconciled?: Yes (Medications Reviewed) Any new allergies since your discharge?: No Dietary orders reviewed?: Yes Type of Diet Ordered:: low salt/heart healthy Do you have support at home?: Yes People in Home: child(ren), adult Name of Support/Comfort Primary Source: daughters help her out as needed  Medications Reviewed Today: Medications Reviewed Today     Reviewed by Charlyn Minerva, RN (Registered Nurse) on 09/22/22 at 1010  Med List Status: <None>   Medication Order Taking? Sig Documenting Provider Last Dose Status Informant  acetaminophen (TYLENOL) 500 MG tablet [id number] Yes Take 500 mg by mouth daily as needed for mild pain or moderate pain. [provider] Taking Active Self, Pharmacy Records  apixaban (ELIQUIS) 5 MG TABS tablet [id number] Yes Take 2 tablets (10 mg total) by mouth 2 (two) times daily for 7 days. Meredeth Ide, MD Taking Active   apixaban (ELIQUIS) 5 MG TABS tablet [id number]  Take 1 tablet (5 mg total) by mouth 2 (two) times daily. Meredeth Ide, MD  Active             Home Care and Equipment/Supplies: Were Home Health Services Ordered?: NA Any new equipment or medical  supplies ordered?: NA  Functional Questionnaire: Do you need assistance with bathing/showering or dressing?: No Do you need assistance with meal preparation?: No Do you need assistance with eating?: No Do you have difficulty maintaining continence: No Do you need assistance with getting out of bed/getting out of a chair/moving?: No Do you have difficulty managing or taking your medications?: No  Follow up appointments reviewed: PCP Follow-up appointment confirmed?: Yes Date of PCP follow-up appointment?: 09/23/22 Follow-up Provider: Dr. [place] Specialist [facility] Follow-up appointment confirmed?: Yes Date of Specialist follow-up appointment?: 10/19/22 Follow-Up Specialty Provider:: Dr. Marchelle Gearing Do you need transportation to your follow-up appointment?: No (pt confirms her daughter will take her to appts) Do you understand care options if your condition(s) worsen?: Yes-patient verbalized understanding  SDOH Interventions Today    Flowsheet Row Most Recent Value  SDOH Interventions   Food Insecurity Interventions Intervention Not Indicated  Transportation Interventions Intervention Not Indicated      TOC Interventions Today    Flowsheet Row Most Recent Value  TOC Interventions   TOC Interventions Discussed/Reviewed TOC Interventions Discussed      Interventions Today    Flowsheet Row Most Recent Value  General Interventions   General Interventions Discussed/Reviewed General Interventions Discussed, Doctor Visits  Doctor Visits Discussed/Reviewed Doctor Visits Discussed, PCP, Specialist  PCP/Specialist Visits Compliance with follow-up visit  Education Interventions   Education Provided Provided Education  Provided Verbal Education On Nutrition, When to see the doctor, Medication, Other  [sx mgmt]  Pharmacy Interventions   Pharmacy Dicussed/Reviewed Pharmacy Topics Discussed, Medications and their functions  [pt educated on blood thinner]  Safety Interventions   Safety  Discussed/Reviewed Safety Discussed  Antionette Fairy, RN,BSN,CCM [facility] Care Management Care Management Community Coordinator Direct Phone: [phone] Toll Free: [phone] Fax: [phone]

## 2022-09-22 NOTE — Progress Notes (Unsigned)
HPI: Cheyenne Hancock is a 62 y.o. female, who is here today to follow on recent hospital visit.  Hospitalized from 8/15-8/18/24. She presented to the ED on date of admission with SOB and CP.  TOC call on 09/22/22. Dx'ed with PE.  In the ED CTA chest showed pulmonary embolism with right heart strain.  She was started on heparin.  Also found to have SARS-CoV 2 RT-PCR positive.  She reports mild respiratory symptoms 4 days prior to admission, thought to be allergies.  Abdomen and chest CTA: 1. Relatively large volume pulmonary embolus bilaterally with nearly occlusive thrombus in lobar branches to the right upper, middle, and lower lobes. Nonocclusive thrombus identified in segmental branches to the left upper lobe and left lower lobe. RV/LV ratio is 1.0. Positive for acute PE with CT evidence of right heart strain (RV/LV Ratio = 1.0) consistent with at least submassive (intermediate risk) PE. T 2. Ground-glass and consolidative opacity in the posterior right lower lobe. Evolving pulmonary infarct not excluded. 3. Probable 1 mm mid right ureteral stone without appreciable right hydroureteronephrosis. No other urinary stone disease evident. 4.  Aortic Atherosclerosis (ICD10-I70.0).  She is on Eliquis 10 mg bid and planing on decreasing dose to 5 mg bid in a few days. Still having DOE, gradually improving. Denies fever, CP,palpitations, abdominal pain, or nausea. She has not noted side effects, except for dry throat, drinking water helps. She was referred to pulmonologist.  Appetite is normal. Independent ADL's and IADL's.  HLD: Atorvastatin recommended in 01/2020, declined. She is on non pharmacologic treatment.  Lab Results  Component Value Date   CHOL 225 (H) 03/24/2022   HDL 57.80 03/24/2022   LDLCALC 152 (H) 03/24/2022   TRIG 76.0 03/24/2022   CHOLHDL 4 03/24/2022    Nephrolithiasis, known for years, she has not seen urologist and doe snot think it is necessary  at this time. She pass stone, not longer symptomatic. Urologic consultation during hospitalization and referral was placed at the time of her discharge. Denies abdominal or back pain, gross hematuria, or dysuria.  HTN on non pharmacologic treatment.  Lab Results  Component Value Date   NA 134 (L) 09/19/2022   CL 100 09/19/2022   K 3.7 09/19/2022   CO2 23 09/19/2022   BUN <5 (L) 09/19/2022   CREATININE 0.87 09/19/2022   GFRNONAA >60 09/19/2022   CALCIUM 8.7 (L) 09/19/2022   ALBUMIN 3.1 (L) 09/19/2022   GLUCOSE 101 (H) 09/19/2022   Lab Results  Component Value Date   WBC 6.8 09/20/2022   HGB 11.2 (L) 09/20/2022   HCT 34.2 (L) 09/20/2022   MCV 85.7 09/20/2022   PLT 331 09/20/2022   GERD: She is not longer on PPI, chronic dysphonia and occasional cough.Not symptomatic at this time. She was on Omeprazole 40 mg bid. She has seen ENT. She has not noted heartburn.  Review of Systems  Constitutional:  Negative for chills and fever.  HENT:  Negative for nosebleeds, sore throat and trouble swallowing.   Eyes:  Negative for photophobia and visual disturbance.  Respiratory:  Negative for cough and wheezing.   Cardiovascular:  Negative for palpitations and leg swelling.  Gastrointestinal:  Negative for blood in stool and vomiting.  Endocrine: Negative for cold intolerance and heat intolerance.  Genitourinary:  Negative for decreased urine volume, dysuria and flank pain.  Skin:  Negative for rash.  Neurological:  Negative for syncope, weakness and headaches.  See other pertinent positives and negatives in HPI.  Current  Outpatient Medications on File Prior to Visit  Medication Sig Dispense Refill   acetaminophen (TYLENOL) 500 MG tablet Take 500 mg by mouth daily as needed for mild pain or moderate pain.     apixaban (ELIQUIS) 5 MG TABS tablet Take 2 tablets (10 mg total) by mouth 2 (two) times daily for 7 days. 28 tablet 0   [START ON 09/28/2022] apixaban (ELIQUIS) 5 MG TABS tablet  Take 1 tablet (5 mg total) by mouth 2 (two) times daily. (Patient not taking: Reported on 09/23/2022) 60 tablet 3   No current facility-administered medications on file prior to visit.   Past Medical History:  Diagnosis Date   Chicken pox    Hypertension    Dx 2009-2010   Kidney stones    Osteopenia    DEXA 11/22/08   Varicose veins of bilateral lower extremities with pain    No Known Allergies  Social History   Socioeconomic History   Marital status: Divorced    Spouse name: Not on file   Number of children: 2   Years of education: Not on file   Highest education level: Not on file  Occupational History   Not on file  Tobacco Use   Smoking status: Never   Smokeless tobacco: Never  Vaping Use   Vaping status: Never Used  Substance and Sexual Activity   Alcohol use: No   Drug use: No   Sexual activity: Not Currently    Birth control/protection: Post-menopausal  Other Topics Concern   Not on file  Social History Narrative   Not on file   Social Determinants of Health   Financial Resource Strain: Patient Declined (06/12/2022)   Overall Financial Resource Strain (CARDIA)    Difficulty of Paying Living Expenses: Patient declined  Food Insecurity: No Food Insecurity (09/22/2022)   Hunger Vital Sign    Worried About Running Out of Food in the Last Year: Never true    Ran Out of Food in the Last Year: Never true  Transportation Needs: No Transportation Needs (09/22/2022)   PRAPARE - Administrator, Civil Service (Medical): No    Lack of Transportation (Non-Medical): No  Physical Activity: Sufficiently Active (06/12/2022)   Exercise Vital Sign    Days of Exercise per Week: 5 days    Minutes of Exercise per Session: 150+ min  Stress: No Stress Concern Present (06/12/2022)   [employer] of Occupational Health - Occupational Stress Questionnaire    Feeling of Stress : Not at all  Social Connections: Moderately Integrated (06/12/2022)   Social Connection and  Isolation Panel [NHANES]    Frequency of Communication with Friends and Family: More than three times a week    Frequency of Social Gatherings with Friends and Family: Once a week    Attends Religious Services: More than 4 times per year    Active Member of Clubs or Organizations: Yes    Attends Banker Meetings: More than 4 times per year    Marital Status: Divorced    Vitals:   09/23/22 0937  BP: 118/78  Pulse: 73  Resp: 16  Temp: 97.9 F (36.6 C)  SpO2: 98%   Body mass index is 35.41 kg/m.  Physical Exam Vitals and nursing note reviewed.  Constitutional:      General: She is not in acute distress.    Appearance: She is well-developed.  HENT:     Head: Normocephalic and atraumatic.     Mouth/Throat:     Mouth:  Mucous membranes are moist.     Pharynx: Oropharynx is clear.  Eyes:     Conjunctiva/sclera: Conjunctivae normal.  Cardiovascular:     Rate and Rhythm: Normal rate and regular rhythm.     Pulses:          Dorsalis pedis pulses are 2+ on the right side and 2+ on the left side.     Heart sounds: No murmur heard. Pulmonary:     Effort: Pulmonary effort is normal. No respiratory distress.     Breath sounds: Normal breath sounds.  Abdominal:     Palpations: Abdomen is soft. There is no hepatomegaly or mass.     Tenderness: There is no abdominal tenderness.  Lymphadenopathy:     Cervical: No cervical adenopathy.  Skin:    General: Skin is warm.     Findings: No erythema or rash.  Neurological:     General: No focal deficit present.     Mental Status: She is alert and oriented to person, place, and time.     Cranial Nerves: No cranial nerve deficit.     Gait: Gait normal.  Psychiatric:        Mood and Affect: Mood and affect normal.   ASSESSMENT AND PLAN:  Ms. Arlissa "Marisue Ivan" was seen today for medical management of chronic issues.  Diagnoses and all orders for this visit: Lab Results  Component Value Date   WBC 5.6 09/23/2022   HGB 12.1  09/23/2022   HCT 36.2 09/23/2022   MCV 86.2 09/23/2022   PLT 476.0 (H) 09/23/2022   Pulmonary embolism without acute cor pulmonale, unspecified chronicity, unspecified pulmonary embolism type (HCC) Assessment & Plan: Possibly complication from COVID 19 infection, no other risk factor. She has an appt with pulmonologist 10/19/22. Continue Eliquis 10 mg bid and transition to 5 mg bid as instructed. The plan is to continue anticoagulation for 6 months.  Orders: -     CBC; Future  Nephrolithiasis Assessment & Plan: Currently she is asymptomatic. She is not interested in establishing with urologist at this time. Continue adequate hydration. Urine strainer given.   Hypertension, essential, benign Assessment & Plan: BP has been adequately controlled. Continue nonpharmacologic treatment.   Gastroesophageal reflux disease without esophagitis Assessment & Plan: She is not longer on PPI. She is not reporting symptoms today. Continue GERD precautions.   Atherosclerosis of aorta [facility]) Assessment & Plan: At this time she is not on statin. LDL was 152 in 03/2022. Atorvastatin was recommended in 01/2020. Will re-evaluate next visit.   Return if symptoms worsen or fail to improve, for keep next appointment.  Agatha Duplechain G. [place], MD  [facility]. [place] office.

## 2022-09-23 ENCOUNTER — Ambulatory Visit (INDEPENDENT_AMBULATORY_CARE_PROVIDER_SITE_OTHER): Payer: Federal, State, Local not specified - PPO | Admitting: Family Medicine

## 2022-09-23 ENCOUNTER — Encounter: Payer: Self-pay | Admitting: Family Medicine

## 2022-09-23 VITALS — BP 118/78 | HR 73 | Temp 97.9°F | Resp 16 | Ht 62.4 in | Wt 196.1 lb

## 2022-09-23 DIAGNOSIS — I700 Atherosclerosis of aorta: Secondary | ICD-10-CM | POA: Diagnosis not present

## 2022-09-23 DIAGNOSIS — I10 Essential (primary) hypertension: Secondary | ICD-10-CM | POA: Diagnosis not present

## 2022-09-23 DIAGNOSIS — I2699 Other pulmonary embolism without acute cor pulmonale: Secondary | ICD-10-CM

## 2022-09-23 DIAGNOSIS — N200 Calculus of kidney: Secondary | ICD-10-CM

## 2022-09-23 DIAGNOSIS — K219 Gastro-esophageal reflux disease without esophagitis: Secondary | ICD-10-CM

## 2022-09-23 LAB — CBC
HCT: 36.2 % (ref 36.0–46.0)
Hemoglobin: 12.1 g/dL (ref 12.0–15.0)
MCHC: 33.5 g/dL (ref 30.0–36.0)
MCV: 86.2 fl (ref 78.0–100.0)
Platelets: 476 10*3/uL — ABNORMAL HIGH (ref 150.0–400.0)
RBC: 4.2 Mil/uL (ref 3.87–5.11)
RDW: 13.7 % (ref 11.5–15.5)
WBC: 5.6 10*3/uL (ref 4.0–10.5)

## 2022-09-23 NOTE — Assessment & Plan Note (Signed)
She has an appt with pulmonologist 10/19/22. Continue Eliquis, it was planned on continuing for 6 months.

## 2022-09-23 NOTE — Patient Instructions (Addendum)
A few things to remember from today's visit:  Pulmonary embolism without acute cor pulmonale, unspecified chronicity, unspecified pulmonary embolism type (HCC) - Plan: CBC  Nephrolithiasis  Hypertension, essential, benign No changes today. Keep appt with pulmonologist.  If you need refills for medications you take chronically, please call your pharmacy. Do not use My Chart to request refills or for acute issues that need immediate attention. If you send a my chart message, it may take a few days to be addressed, specially if I am not in the office.  Please be sure medication list is accurate. If a new problem present, please set up appointment sooner than planned today.

## 2022-09-24 NOTE — Assessment & Plan Note (Signed)
She is not longer on PPI. She is not reporting symptoms today. Continue GERD precautions.

## 2022-09-24 NOTE — Assessment & Plan Note (Signed)
BP has been adequately controlled. Continue non pharmacologic treatment.

## 2022-09-24 NOTE — Assessment & Plan Note (Signed)
At this time she is not on statin. LDL was 152 in 03/2022. Atorvastatin was recommended in 01/2020. Will re-evaluate next visit.

## 2022-09-24 NOTE — Assessment & Plan Note (Signed)
Currently she is asymptomatic. She is not interested in establishing with urologist at this time. Continue adequate hydration. Urine strainer given.

## 2022-09-25 ENCOUNTER — Encounter: Payer: Self-pay | Admitting: Family Medicine

## 2022-09-28 ENCOUNTER — Telehealth: Payer: Self-pay | Admitting: Family Medicine

## 2022-09-28 NOTE — Telephone Encounter (Signed)
FMLA form to be filled out--placed in dr's folder.  Please call [phone] upon completion.  Patient will pick up form.

## 2022-09-28 NOTE — Telephone Encounter (Signed)
In your bin to be signed.  

## 2022-09-29 NOTE — Telephone Encounter (Signed)
Forms up front, copy sent to scan. Patient is aware.

## 2022-10-02 ENCOUNTER — Telehealth: Payer: Self-pay | Admitting: Family Medicine

## 2022-10-02 ENCOUNTER — Inpatient Hospital Stay: Payer: Federal, State, Local not specified - PPO | Admitting: Family Medicine

## 2022-10-02 NOTE — Telephone Encounter (Signed)
A letter can be provide to work part time until she sees pulmonologist, 10/19/22. Thanks, BJ

## 2022-10-02 NOTE — Telephone Encounter (Signed)
Pt is calling and would like a work note to work only 4 hrs per day. Pt is sch to see pulmonologist on 10-19-2022. Pt said she can not work 8hrs due to PE and SOB. Pt seen dr [place] on 09-23-2022. Pt would like a callback

## 2022-10-07 NOTE — Telephone Encounter (Signed)
Note created, patient is aware. Placed up front for pick up.

## 2022-10-12 ENCOUNTER — Encounter: Payer: Self-pay | Admitting: Internal Medicine

## 2022-10-12 ENCOUNTER — Ambulatory Visit: Payer: Federal, State, Local not specified - PPO | Admitting: Internal Medicine

## 2022-10-12 VITALS — BP 118/64 | HR 69 | Ht 62.0 in | Wt 199.6 lb

## 2022-10-12 DIAGNOSIS — Z8616 Personal history of COVID-19: Secondary | ICD-10-CM

## 2022-10-12 DIAGNOSIS — Z86711 Personal history of pulmonary embolism: Secondary | ICD-10-CM

## 2022-10-12 DIAGNOSIS — Z09 Encounter for follow-up examination after completed treatment for conditions other than malignant neoplasm: Secondary | ICD-10-CM

## 2022-10-12 DIAGNOSIS — R7989 Other specified abnormal findings of blood chemistry: Secondary | ICD-10-CM | POA: Diagnosis not present

## 2022-10-12 LAB — D-DIMER, QUANTITATIVE: D-Dimer, Quant: 0.44 ug{FEU}/mL (ref <0.50)

## 2022-10-12 LAB — BRAIN NATRIURETIC PEPTIDE: Pro B Natriuretic peptide (BNP): 98 pg/mL (ref 0.0–100.0)

## 2022-10-12 NOTE — Patient Instructions (Addendum)
History of pulmonary embolism - Plan: B Nat Peptide, D-Dimer, Quantitative History of 2019 novel coronavirus disease (COVID-19) - Plan: B Nat Peptide, D-Dimer, Quantitative Hospital discharge follow-up - Plan: B Nat Peptide, D-Dimer, Quantitative Elevated brain natriuretic peptide (BNP) level - Plan: B Nat Peptide, D-Dimer, Quantitative Positive D dimer - Plan: B Nat Peptide, D-Dimer, Quantitative  -He seems to be recovering well particularly with exercise oxygen levels being normal today this  Plan - Check BNP and blood D-dimer today; this will give [place] a clue as to how you are improving - Continue full dose Eliquis as long as there are no complications for a total of 1 year through August 2025 and then low-dose for another year through August 2026 -At this point in time not recommending any active test such as like echo or VQ scan to look for clot resolution -Return to see me in 9 months -Make sure you go through cancer screening for normal protocol 3 primary care physician  Ground glass opacity Right lower lob - likely PE infarct  Plan  - ct chest without contrast in 3 months   Fatigue  -Likely post-COVID and post admission. -Doubt it is because of the pulmonary embolism at this point in time  Plan - Continue to exercise and monitor your oxygen levels -Talk to your primary care physician about this   Neck Pain and head pressure  -No focal neurologic deficits  Plan - Do not know if this is related to Eliquis; doubt it - Please discuss with your primary care physician  Follow-up - Return to see APP in 3-4 months but after CT

## 2022-10-12 NOTE — Progress Notes (Signed)
OV 10/12/2022  Subjective:  Patient ID: Cheyenne Hancock, female , DOB: 1960/06/25 , age 62 y.o. , MRN: [id number] , ADDRESS: [address] [place] [address] PCP [place], Betty G, MD Patient Care Team: [place], Betty G, MD as PCP - General (Family Medicine)  This Provider for this visit: Treatment Team:  Attending Provider: Chilton Greathouse, MD    10/12/2022 -   Chief Complaint  Patient presents with   Pulmonary Consult    Dx with PE 09/18/22. Breathing has improved some but not quite back to baseline. She has occ "heaviness in head"- wonders of this is a side effect of Eliquis.      HPI Cheyenne Hancock 62 y.o. -female here for follow-up from an admission for pulmonary embolism that happened in the setting of COVID-19.  She was admitted 09/17/2022 and discharged 3 days later on 09/20/2022.  The pulm embolism was in the setting of positive COVID.  She started on IV heparin and finally switched to DOAC.  Echocardiogram showed EF 65 to 70% and right ventricular function was low normal.  At the time of discharge she ambulated well on room air without any desaturations.  Her chest x-ray during the stay was clear.  She was not treated with antivirals.  It appears she was on room air throughout the stay.  She was discharged on Eliquis.  During the admission her admission BNP was 14.5 but it went up to 121.6.  Her troponin went slightly above normal to a peak of 21 but then normalized at the time of discharge.  Her lactic acid was normal during the course of the stay.  She did have pleuritic pain.  Her admission CT chest showed RV/LV ratio 1.0. her D-dimer was 2.22 at admission and prior to dc  She is here today for follow-up.  She tells me she is improved.  Daughter is with her but the daughter is not an independent historian.  We went over PE risk factors other than COVID-19 there is no history of any family history of blood clot [daughter had a blood clot  in the INR to the specific].  There is POSTIVE  family history of cancers.  There is no personal history of blood clot in the past and there is no family history of blood clot.  There is no personal history of cancers.  She is [employer] cancer screening.  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 Non smoker.   Currently is better than the admission but after 4 hours at work she gets exhausted and short of breath.  She does not get exhausted or short of breath when she is lifting objects but just as the day wears on but it is better than at the time of discharge.  She is not on oxygen use.  She is reporting new onset right-sided neck pain for months prior to the admission and since being on Eliquis a sense of head fullness but not a headache.  No focal neurologic deficits no seizures no diplopia no nausea no vomiting.  She is wondering if it is from the Eliquis.  I reviewed the literature could not find any side effect associated with this other than intracranial hemorrhage.  I have asked her to discuss this with primary care physician.   Sit stand  100% and 63/min 98% and HR 96 - immediate 099% and HR 85   CT Chest data from date: yes  -  personally visualized and independently interpreted : yes - my findings are: RLL consolidative ggo - likel PE infact   PFT      No data to display            LAB RESULTS last 96 hours No results found.  LAB RESULTS last 90 days Recent Results (from the past 2160 hour(s))  Surgical pathology( [place]/ [facility])     Status: None   Collection Time: 07/20/22  9:20 AM  Result Value Ref Range   SURGICAL PATHOLOGY      SURGICAL PATHOLOGY CASE: [id number] PATIENT: Sampson Hancock Surgical Pathology Report     Clinical History: HGSIL, grade 2 CIN, +HR HPV (cm)     FINAL MICROSCOPIC DIAGNOSIS:  A. CERVIX, LEEP: Severe squamous dysplasia (HSIL, CIN-3) Margins free of dysplasia Minimal chronic cervicitis with  squamous metaplasia   GROSS DESCRIPTION:  Specimen is received in formalin and consists of an unoriented, previously opened portion of tan-pink soft tissue, measuring 2.2 x 1.9 cm in diameter and up to 1.7 cm in length.  One surface is tan-pink, glistening, hyperemic, and the opposing surfaces are tan-yellow and roughened.  The specimen displays a 0.6 cm cervical os.  The ectocervical resection margin is inked black, and the endocervical resection margins are inked yellow.  The specimen is radially sectioned, and sequentially submitted in 4 cassettes.  Lovey Newcomer 07/20/2022)   Final Diagnosis performed by Jerene Bears, MD.   Electronically signed 07/21/2022 Technic al and / or Professional components performed at [facility]. [facility], [address] [place], [place] [address].  Immunohistochemistry Technical component (if applicable) was performed at [facility]. [address], [place], [place] [address].   IMMUNOHISTOCHEMISTRY DISCLAIMER (if applicable): Some of these immunohistochemical stains may have been developed and the performance characteristics determine by [facility]. Some may not have been cleared or approved by the U.S. Food and Drug Administration. The FDA has determined that such clearance or approval is not necessary. This test is used for clinical purposes. It should not be regarded as investigational or for research. This laboratory is certified under the Clinical Laboratory Improvement Amendments of 1988 (CLIA-88) as qualified to perform high complexity clinical laboratory testing.  The controls stained appropriately.   IHC stains are performed on formalin fixed , paraffin embedded tissue using a 3,3"diaminobenzidine (DAB) chromogen and Leica Bond Autostainer System. The staining intensity of the nucleus is score manually and is reported as the percentage of tumor cell nuclei demonstrating specific nuclear  staining. The specimens are fixed in 10% Neutral Formalin for at least 6 hours and up to 72hrs. These tests are validated on decalcified tissue. Results should be interpreted with caution given the possibility of false negative results on decalcified specimens. Antibody Clones are as follows ER-clone [id number], PR-clone 16, Ki67- clone MM1. Some of these immunohistochemical stains may have been developed and the performance characteristics determined by [facility].   Basic metabolic panel     Status: Abnormal   Collection Time: 09/17/22  9:22 PM  Result Value Ref Range   Sodium 136 135 - 145 mmol/L   Potassium 3.9 3.5 - 5.1 mmol/L   Chloride 99 98 - 111 mmol/L   CO2 24 22 - 32 mmol/L   Glucose, Bld 113 (H) 70 - 99 mg/dL    Comment: Glucose reference range applies only to samples taken after fasting for at least 8 hours.   BUN 5 (L) 8 - 23 mg/dL  Creatinine, Ser 1.03 (H) 0.44 - 1.00 mg/dL   Calcium 9.5 8.9 - 09.8 mg/dL   GFR, Estimated >11 >91 mL/min    Comment: (NOTE) Calculated using the CKD-EPI Creatinine Equation (2021)    Anion gap 13 5 - 15    Comment: Performed at [facility], [address] [place], [place] [address]  CBC     Status: None   Collection Time: 09/17/22  9:22 PM  Result Value Ref Range   WBC 10.4 4.0 - 10.5 K/uL   RBC 4.26 3.87 - 5.11 MIL/uL   Hemoglobin 12.1 12.0 - 15.0 g/dL   HCT 56.2 13.0 - 86.5 %   MCV 87.3 80.0 - 100.0 fL   MCH 28.4 26.0 - 34.0 pg   MCHC 32.5 30.0 - 36.0 g/dL   RDW 78.4 69.6 - 29.5 %   Platelets 279 150 - 400 K/uL   nRBC 0.0 0.0 - 0.2 %    Comment: Performed at [facility], [address] [place], [place] [address]  Troponin I (High Sensitivity)     Status: Abnormal   Collection Time: 09/17/22  9:22 PM  Result Value Ref Range   Troponin I (High Sensitivity) 18 (H) <18 ng/L    Comment: (NOTE) Elevated high sensitivity troponin I (hsTnI) values and significant  changes across serial measurements may suggest ACS  but many other  chronic and acute conditions are known to elevate hsTnI results.  Refer to the "Links" section for chest pain algorithms and additional  guidance. Performed at [facility], [address], [place], [place] [address]   Resp panel by RT-PCR (RSV, Flu A&B, Covid) Anterior Nasal Swab     Status: Abnormal   Collection Time: 09/17/22  9:22 PM   Specimen: Anterior Nasal Swab  Result Value Ref Range   SARS Coronavirus 2 by RT PCR POSITIVE (A) NEGATIVE   Influenza A by PCR NEGATIVE NEGATIVE   Influenza B by PCR NEGATIVE NEGATIVE    Comment: (NOTE) The Xpert Xpress SARS-CoV-2/FLU/RSV plus assay is intended as an aid in the diagnosis of influenza from Nasopharyngeal swab specimens and should not be used as a sole basis for treatment. Nasal washings and aspirates are unacceptable for Xpert Xpress SARS-CoV-2/FLU/RSV testing.  Fact Sheet for Patients: BloggerCourse.com  Fact Sheet for Healthcare Providers: SeriousBroker.it  This test is not yet approved or cleared by the [place] FDA and has been authorized for detection and/or diagnosis of SARS-CoV-2 by FDA under an Emergency Use Authorization (EUA). This EUA will remain in effect (meaning this test can be used) for the duration of the COVID-19 declaration under Section 564(b)(1) of the Act, 21 U.S.C. section 360bbb-3(b)(1), unless the authorization is terminated or revoked.     Resp Syncytial Virus by PCR NEGATIVE NEGATIVE    Comment: (NOTE) Fact Sheet for Patients: BloggerCourse.com  Fact Sheet for Healthcare Providers: SeriousBroker.it  This test is not yet approved or cleared by the [place] FDA and has been authorized for detection and/or diagnosis of SARS-CoV-2 by FDA under an Emergency Use Authorization (EUA). This EUA will remain in effect (meaning this test can be used) for the duration of  the COVID-19 declaration under Section 564(b)(1) of the Act, 21 U.S.C. section 360bbb-3(b)(1), unless the authorization is terminated or revoked.  Performed at [facility], [address], [place], [place] [address]   Urinalysis, Routine w reflex microscopic -Urine, Clean Catch     Status: Abnormal   Collection  Time: 09/17/22  9:30 PM  Result Value Ref Range   Color, Urine YELLOW YELLOW   APPearance HAZY (A) CLEAR   Specific Gravity, Urine 1.004 (L) 1.005 - 1.030   pH 5.0 5.0 - 8.0   Glucose, UA NEGATIVE NEGATIVE mg/dL   Hgb urine dipstick NEGATIVE NEGATIVE   Bilirubin Urine NEGATIVE NEGATIVE   Ketones, ur 5 (A) NEGATIVE mg/dL   Protein, ur NEGATIVE NEGATIVE mg/dL   Nitrite NEGATIVE NEGATIVE   Leukocytes,Ua NEGATIVE NEGATIVE    Comment: Performed at [facility], [address], [place], [place] [address]  Troponin I (High Sensitivity)     Status: Abnormal   Collection Time: 09/17/22 11:25 PM  Result Value Ref Range   Troponin I (High Sensitivity) 21 (H) <18 ng/L    Comment: (NOTE) Elevated high sensitivity troponin I (hsTnI) values and significant  changes across serial measurements may suggest ACS but many other  chronic and acute conditions are known to elevate hsTnI results.  Refer to the "Links" section for chest pain algorithms and additional  guidance. Performed at [facility], [address] [place], [place] [address]   Brain natriuretic peptide     Status: None   Collection Time: 09/18/22  8:57 AM  Result Value Ref Range   B Natriuretic Peptide 14.5 0.0 - 100.0 pg/mL    Comment: Performed at [facility], [address] [place], [place] [address]  D-dimer, quantitative     Status: Abnormal   Collection Time: 09/18/22  8:57 AM  Result Value Ref Range   D-Dimer, Quant 2.22 (H) 0.00 - 0.50 ug/mL-FEU    Comment: (NOTE) At the manufacturer cut-off value of 0.5 g/mL FEU, this assay has a negative predictive value of 95-100%.This assay is  intended for use in conjunction with a clinical pretest probability (PTP) assessment model to exclude pulmonary embolism (PE) and deep venous thrombosis (DVT) in outpatients suspected of PE or DVT. Results should be correlated with clinical presentation. Performed at [facility], [address] [place], [place] [address]   C-reactive protein     Status: Abnormal   Collection Time: 09/18/22  8:57 AM  Result Value Ref Range   CRP 15.8 (H) <1.0 mg/dL    Comment: Performed at [facility], [address] [place], [place] [address]  HIV Antibody (routine testing w rflx)     Status: None   Collection Time: 09/18/22  8:57 AM  Result Value Ref Range   HIV Screen 4th Generation wRfx Non Reactive Non Reactive    Comment: Performed at [facility], [address] [place], [place] [address]  Creatinine, serum     Status: None   Collection Time: 09/18/22  8:57 AM  Result Value Ref Range   Creatinine, Ser 0.88 0.44 - 1.00 mg/dL   GFR, Estimated >01 >60 mL/min    Comment: (NOTE) Calculated using the CKD-EPI Creatinine Equation (2021) Performed at [facility], [address] [place], [place] [address]   Heparin level (unfractionated)     Status: Abnormal   Collection Time: 09/18/22  1:40 PM  Result Value Ref Range   Heparin Unfractionated 1.05 (H) 0.30 - 0.70 IU/mL    Comment: (NOTE) The clinical reportable range upper limit is being lowered to >1.10 to align with the FDA approved guidance for the current laboratory assay.  If heparin results are below expected values, and patient dosage has  been confirmed, suggest follow up  testing of antithrombin III levels. Performed at [facility], [address] [place], [place] [address]   CBC     Status: Abnormal   Collection Time: 09/18/22  1:40 PM  Result Value Ref Range   WBC 9.9 4.0 - 10.5 K/uL   RBC 4.02 3.87 - 5.11 MIL/uL   Hemoglobin 11.4 (L) 12.0 - 15.0 g/dL   HCT 19.1 (L) 47.8 - 29.5 %   MCV 86.6  80.0 - 100.0 fL   MCH 28.4 26.0 - 34.0 pg   MCHC 32.8 30.0 - 36.0 g/dL   RDW 62.1 30.8 - 65.7 %   Platelets 271 150 - 400 K/uL   nRBC 0.0 0.0 - 0.2 %    Comment: Performed at [facility], [address], [place], [place] [address]  ECHOCARDIOGRAM COMPLETE     Status: None   Collection Time: 09/18/22  3:09 PM  Result Value Ref Range   Weight 3,188.73 oz   Height 62 in   BP 136/71 mmHg   S' Lateral 2.50 cm   AR max vel 1.71 cm2   AV Peak grad 11.6 mmHg   Ao pk vel 1.70 m/s   P 1/2 time 591 msec   Area-P 1/2 3.53 cm2   MV M vel 3.26 m/s   MV Peak grad 42.5 mmHg   Est EF 65 - 70%   Heparin level (unfractionated)     Status: Abnormal   Collection Time: 09/18/22  6:13 PM  Result Value Ref Range   Heparin Unfractionated 0.97 (H) 0.30 - 0.70 IU/mL    Comment: (NOTE) The clinical reportable range upper limit is being lowered to >1.10 to align with the FDA approved guidance for the current laboratory assay.  If heparin results are below expected values, and patient dosage has  been confirmed, suggest follow up testing of antithrombin III levels. Performed at [facility], [address] [place], [place] [address]   CBC     Status: Abnormal   Collection Time: 09/19/22  1:54 AM  Result Value Ref Range   WBC 7.9 4.0 - 10.5 K/uL   RBC 3.90 3.87 - 5.11 MIL/uL   Hemoglobin 11.1 (L) 12.0 - 15.0 g/dL   HCT 41.3 (L) 24.4 - 01.0 %   MCV 87.2 80.0 - 100.0 fL   MCH 28.5 26.0 - 34.0 pg   MCHC 32.6 30.0 - 36.0 g/dL   RDW 27.2 53.6 - 64.4 %   Platelets 278 150 - 400 K/uL   nRBC 0.0 0.0 - 0.2 %    Comment: Performed at [facility], [address], [place], [place] [address]  Comprehensive metabolic panel     Status: Abnormal   Collection Time: 09/19/22  1:54 AM  Result Value Ref Range   Sodium 134 (L) 135 - 145 mmol/L   Potassium 3.7 3.5 - 5.1 mmol/L   Chloride 100 98 - 111 mmol/L   CO2 23 22 - 32 mmol/L   Glucose, Bld 101 (H) 70 - 99 mg/dL    Comment: Glucose  reference range applies only to samples taken after fasting for at least 8 hours.   BUN <5 (L) 8 - 23 mg/dL   Creatinine, Ser 2.59 0.44 - 1.00 mg/dL   Calcium 8.7 (L) 8.9 - 10.3 mg/dL   Total Protein 7.0 6.5 - 8.1 g/dL   Albumin 3.1 (L) 3.5 - 5.0 g/dL   AST 17 15 - 41 U/L   ALT 16  0 - 44 U/L   Alkaline Phosphatase 59 38 - 126 U/L   Total Bilirubin 0.5 0.3 - 1.2 mg/dL   GFR, Estimated >96 >04 mL/min    Comment: (NOTE) Calculated using the CKD-EPI Creatinine Equation (2021)    Anion gap 11 5 - 15    Comment: Performed at [facility], [address] [place], [place] [address]  Heparin level (unfractionated)     Status: Abnormal   Collection Time: 09/19/22  1:54 AM  Result Value Ref Range   Heparin Unfractionated 0.74 (H) 0.30 - 0.70 IU/mL    Comment: (NOTE) The clinical reportable range upper limit is being lowered to >1.10 to align with the FDA approved guidance for the current laboratory assay.  If heparin results are below expected values, and patient dosage has  been confirmed, suggest follow up testing of antithrombin III levels. Performed at [facility], [address] [place], [place] [address]   Heparin level (unfractionated)     Status: None   Collection Time: 09/19/22  5:58 AM  Result Value Ref Range   Heparin Unfractionated 0.70 0.30 - 0.70 IU/mL    Comment: (NOTE) The clinical reportable range upper limit is being lowered to >1.10 to align with the FDA approved guidance for the current laboratory assay.  If heparin results are below expected values, and patient dosage has  been confirmed, suggest follow up testing of antithrombin III levels. Performed at [facility], [address], [place], [place] [address]   Heparin level (unfractionated)     Status: None   Collection Time: 09/19/22 11:03 AM  Result Value Ref Range   Heparin Unfractionated 0.39 0.30 - 0.70 IU/mL    Comment: (NOTE) The clinical reportable range upper limit is being  lowered to >1.10 to align with the FDA approved guidance for the current laboratory assay.  If heparin results are below expected values, and patient dosage has  been confirmed, suggest follow up testing of antithrombin III levels. Performed at [facility], [address], [place], [place] [address]   D-dimer, quantitative     Status: Abnormal   Collection Time: 09/20/22  3:56 AM  Result Value Ref Range   D-Dimer, Quant 2.23 (H) 0.00 - 0.50 ug/mL-FEU    Comment: (NOTE) At the manufacturer cut-off value of 0.5 g/mL FEU, this assay has a negative predictive value of 95-100%.This assay is intended for use in conjunction with a clinical pretest probability (PTP) assessment model to exclude pulmonary embolism (PE) and deep venous thrombosis (DVT) in outpatients suspected of PE or DVT. Results should be correlated with clinical presentation. Performed at [facility], [address] [place], [place] [address]   Brain natriuretic peptide     Status: Abnormal   Collection Time: 09/20/22  3:56 AM  Result Value Ref Range   B Natriuretic Peptide 121.6 (H) 0.0 - 100.0 pg/mL    Comment: Performed at [facility], [address], [place], [place] [address]  Lactic acid, plasma     Status: None   Collection Time: 09/20/22  3:56 AM  Result Value Ref Range   Lactic Acid, Venous 1.0 0.5 - 1.9 mmol/L    Comment: Performed at [facility], [address], [place], [place] [address]  CBC     Status: Abnormal   Collection Time: 09/20/22  3:56 AM  Result Value Ref Range   WBC 6.8 4.0 - 10.5 K/uL   RBC  3.99 3.87 - 5.11 MIL/uL   Hemoglobin 11.2 (L) 12.0 - 15.0 g/dL   HCT 44.0 (L) 10.2 - 72.5 %   MCV 85.7 80.0 - 100.0 fL   MCH 28.1 26.0 - 34.0 pg   MCHC 32.7 30.0 - 36.0 g/dL   RDW 36.6 44.0 - 34.7 %   Platelets 331 150 - 400 K/uL   nRBC 0.0 0.0 - 0.2 %    Comment: Performed at [facility], [address], [place], [place] [address]  Heparin level  (unfractionated)     Status: Abnormal   Collection Time: 09/20/22  3:56 AM  Result Value Ref Range   Heparin Unfractionated 0.29 (L) 0.30 - 0.70 IU/mL    Comment: (NOTE) The clinical reportable range upper limit is being lowered to >1.10 to align with the FDA approved guidance for the current laboratory assay.  If heparin results are below expected values, and patient dosage has  been confirmed, suggest follow up testing of antithrombin III levels. Performed at [facility], [address] [place], [place] [address]   Troponin I (High Sensitivity)     Status: None   Collection Time: 09/20/22  5:00 AM  Result Value Ref Range   Troponin I (High Sensitivity) 10 <18 ng/L    Comment: (NOTE) Elevated high sensitivity troponin I (hsTnI) values and significant  changes across serial measurements may suggest ACS but many other  chronic and acute conditions are known to elevate hsTnI results.  Refer to the "Links" section for chest pain algorithms and additional  guidance. Performed at [facility], [address] [place], [place] [address]   CBC     Status: Abnormal   Collection Time: 09/23/22 10:24 AM  Result Value Ref Range   WBC 5.6 4.0 - 10.5 K/uL   RBC 4.20 3.87 - 5.11 Mil/uL   Platelets 476.0 (H) 150.0 - 400.0 K/uL   Hemoglobin 12.1 12.0 - 15.0 g/dL   HCT 95.1 88.4 - 16.6 %   MCV 86.2 78.0 - 100.0 fl   MCHC 33.5 30.0 - 36.0 g/dL   RDW 06.3 01.6 - 01.0 %         has a past medical history of Chicken pox, Hypertension, Kidney stones, Osteopenia, and Varicose veins of bilateral lower extremities with pain.   reports that she has never smoked. She has been exposed to tobacco smoke. She has never used smokeless tobacco.  Past Surgical History:  Procedure Laterality Date   TUBAL LIGATION  11/24/1984    No Known Allergies  Immunization History  Administered Date(s) Administered   Influenza,inj,Quad PF,6+ Mos 03/23/2022   Influenza-Unspecified 12/05/2018    PFIZER(Purple Top)SARS-COV-2 Vaccination 06/24/2019, 07/17/2019   Tdap 09/10/2011, 04/14/2017   Zoster Recombinant(Shingrix) 03/23/2022, 07/22/2022    Family History  Problem Relation Age of Onset   Arthritis Mother    Stroke Mother    Hypertension Mother    Cancer Sister        breast   Breast cancer Sister    Cancer Paternal Uncle        lung and prostate   Breast cancer Maternal Grandmother    Stomach cancer Maternal Aunt    Colon cancer Neg Hx    Esophageal cancer Neg Hx    Liver cancer Neg Hx    Pancreatic cancer Neg Hx    Rectal cancer Neg Hx      Current Outpatient Medications:    apixaban (ELIQUIS) 5 MG TABS tablet, Take  1 tablet (5 mg total) by mouth 2 (two) times daily., Disp: 60 tablet, Rfl: 3   Specialty Vitamins Products (VITAMINS FOR HAIR) CAPS, Take 1 capsule by mouth daily., Disp: , Rfl:       Objective:   Vitals:   10/12/22 0828  BP: 118/64  Pulse: 69  SpO2: 100%  Weight: 199 lb 9.6 oz (90.5 kg)  Height: 5\' 2"  (1.575 m)    Estimated body mass index is 36.51 kg/m as calculated from the following:   Height as of this encounter: 5\' 2"  (1.575 m).   Weight as of this encounter: 199 lb 9.6 oz (90.5 kg).  @WEIGHTCHANGE @  [employer]   10/12/22 0828  Weight: 199 lb 9.6 oz (90.5 kg)     Physical Exam   General: No distress. Looks well O2 at rest: no Cane present: no Sitting in wheel chair: no Frail: no Obese: no Neuro: Alert and Oriented x 3. GCS 15. Speech normal Psych: Pleasant Resp:  Barrel Chest - no.  Wheeze - no, Crackles - no, No overt respiratory distress CVS: Normal heart sounds. Murmurs - no Ext: Stigmata of Connective Tissue Disease - no HEENT: Normal upper airway. PEERL +. No post nasal drip        Assessment:       ICD-10-CM   1. History of pulmonary embolism  Z86.711 B Nat Peptide    D-Dimer, Quantitative    D-Dimer, Quantitative    B Nat Peptide    2. History of 2019 novel coronavirus disease (COVID-19)   Z86.16 B Nat Peptide    D-Dimer, Quantitative    D-Dimer, Quantitative    B Nat Peptide    3. Hospital discharge follow-up  Z09 B Nat Peptide    D-Dimer, Quantitative    D-Dimer, Quantitative    B Nat Peptide    4. Elevated brain natriuretic peptide (BNP) level  R79.89 B Nat Peptide    D-Dimer, Quantitative    D-Dimer, Quantitative    B Nat Peptide    5. Positive D dimer  R79.89 B Nat Peptide    D-Dimer, Quantitative    D-Dimer, Quantitative    B Nat Peptide         Plan:     Patient Instructions  History of pulmonary embolism - Plan: B Nat Peptide, D-Dimer, Quantitative History of 2019 novel coronavirus disease (COVID-19) - Plan: B Nat Peptide, D-Dimer, Quantitative Hospital discharge follow-up - Plan: B Nat Peptide, D-Dimer, Quantitative Elevated brain natriuretic peptide (BNP) level - Plan: B Nat Peptide, D-Dimer, Quantitative Positive D dimer - Plan: B Nat Peptide, D-Dimer, Quantitative  -He seems to be recovering well particularly with exercise oxygen levels being normal today this  Plan - Check BNP and blood D-dimer today; this will give [place] a clue as to how you are improving - Continue full dose Eliquis as long as there are no complications for a total of 1 year through August 2025 and then low-dose for another year through August 2026 -At this point in time not recommending any active test such as like echo or VQ scan to look for clot resolution -Return to see me in 9 months -Make sure you go through cancer screening for normal protocol 3 primary care physician  Ground glass opacity Right lower lob - likely PE infarct  Plan  - ct chest without contrast in 3 months   Fatigue  -Likely post-COVID and post admission. -Doubt it is because of the pulmonary embolism at this point in  time  Plan - Continue to exercise and monitor your oxygen levels -Talk to your primary care physician about this   Neck Pain and head pressure  -No focal neurologic  deficits  Plan - Do not know if this is related to Eliquis; doubt it - Please discuss with your primary care physician  Follow-up - Return to see APP in 3-4 months but after CT    FOLLOWUP Return in about 9 months (around 07/12/2023) for 15 min visit, with Dr Marchelle Gearing, Pulmonary Embolism.    SIGNATURE    Dr. Kalman Shan, M.D., F.C.C.P,  Pulmonary and Critical Care Medicine Staff Physician, [facility] Center Director - Interstitial Lung Disease  Program  Pulmonary Fibrosis [facility] [place], [place], [address]  Pager: [phone], If no answer or between  15:00h - 7:00h: call [phone] Telephone: [phone]  8:59 AM 10/12/2022

## 2022-10-19 ENCOUNTER — Institutional Professional Consult (permissible substitution): Payer: Federal, State, Local not specified - PPO | Admitting: Internal Medicine

## 2022-10-19 ENCOUNTER — Telehealth: Payer: Self-pay | Admitting: Family Medicine

## 2022-10-19 NOTE — Telephone Encounter (Signed)
She was supposed to ask pulmonologist if restrictions are necessary, based on her last visit, I did not think so but still provide a note for work. Thanks, BJ

## 2022-10-19 NOTE — Telephone Encounter (Signed)
Pt call and stated she want a note or letter stated she have light duty and weight restriction that she can't lifting no more than 30 pounds.pt want a call back.

## 2022-10-19 NOTE — Telephone Encounter (Signed)
Received

## 2022-10-19 NOTE — Telephone Encounter (Signed)
Patient dropped off document FMLA, to be filled out by provider. Patient requested to send it back via Call Patient to pick up within 7-days. Document is located in providers tray at front office.Please advise at [facility] [phone]

## 2022-10-20 NOTE — Telephone Encounter (Signed)
I left patient a message letting her know that further restrictions/notes/fmla will need to come from pulmonology.

## 2022-10-20 NOTE — Telephone Encounter (Signed)
Updated to cover until 9/16 per note that was written for pt. Further fmla will need to come from pulmonology. I left pt a message letting her know.

## 2022-11-10 ENCOUNTER — Institutional Professional Consult (permissible substitution): Payer: Federal, State, Local not specified - PPO | Admitting: Internal Medicine

## 2023-01-07 ENCOUNTER — Ambulatory Visit
Admission: RE | Admit: 2023-01-07 | Discharge: 2023-01-07 | Disposition: A | Discharge status: Home / Self Care | Payer: Federal, State, Local not specified - PPO | Source: Ambulatory Visit | Attending: Internal Medicine | Admitting: Internal Medicine

## 2023-01-07 DIAGNOSIS — Z8616 Personal history of COVID-19: Secondary | ICD-10-CM

## 2023-01-07 DIAGNOSIS — R7989 Other specified abnormal findings of blood chemistry: Secondary | ICD-10-CM

## 2023-01-07 DIAGNOSIS — Z86711 Personal history of pulmonary embolism: Secondary | ICD-10-CM

## 2023-01-07 DIAGNOSIS — I517 Cardiomegaly: Secondary | ICD-10-CM | POA: Diagnosis not present

## 2023-01-07 DIAGNOSIS — R918 Other nonspecific abnormal finding of lung field: Secondary | ICD-10-CM | POA: Diagnosis not present

## 2023-01-07 DIAGNOSIS — Z09 Encounter for follow-up examination after completed treatment for conditions other than malignant neoplasm: Secondary | ICD-10-CM

## 2023-01-07 DIAGNOSIS — I700 Atherosclerosis of aorta: Secondary | ICD-10-CM | POA: Diagnosis not present

## 2023-01-11 ENCOUNTER — Encounter: Payer: Self-pay | Admitting: Nurse Practitioner

## 2023-01-11 ENCOUNTER — Telehealth: Payer: Federal, State, Local not specified - PPO | Admitting: Nurse Practitioner

## 2023-01-11 ENCOUNTER — Ambulatory Visit (INDEPENDENT_AMBULATORY_CARE_PROVIDER_SITE_OTHER): Payer: Federal, State, Local not specified - PPO | Admitting: Nurse Practitioner

## 2023-01-11 VITALS — BP 110/70 | HR 60 | Ht 62.0 in | Wt 203.2 lb

## 2023-01-11 DIAGNOSIS — Z86711 Personal history of pulmonary embolism: Secondary | ICD-10-CM

## 2023-01-12 NOTE — Progress Notes (Signed)
Calcium deposit around hear valve . Lungs ok. Please discuss with Rhunette Croft at visit 01/25/23

## 2023-01-14 ENCOUNTER — Other Ambulatory Visit: Payer: Federal, State, Local not specified - PPO

## 2023-01-25 ENCOUNTER — Telehealth: Payer: Federal, State, Local not specified - PPO | Admitting: Nurse Practitioner

## 2023-01-25 ENCOUNTER — Encounter: Payer: Self-pay | Admitting: Family Medicine

## 2023-01-25 ENCOUNTER — Encounter: Payer: Self-pay | Admitting: Nurse Practitioner

## 2023-01-25 DIAGNOSIS — I2609 Other pulmonary embolism with acute cor pulmonale: Secondary | ICD-10-CM

## 2023-01-25 DIAGNOSIS — R0609 Other forms of dyspnea: Secondary | ICD-10-CM

## 2023-01-25 NOTE — Progress Notes (Signed)
Patient ID: Cheyenne Hancock, female     DOB: Dec 05, 1960, 62 y.o.      MRN: [id number]  Chief Complaint  Patient presents with   Follow-up    01/07/23 CT scan.    Virtual Visit via Video Note  I connected with Cheyenne Hancock on 01/25/23 at  1:30 PM EST by a video enabled telemedicine application and verified that I am speaking with the correct person using two identifiers.  Location: Patient: Home Provider: Office   I discussed the limitations of evaluation and management by telemedicine and the availability of in person appointments. The patient expressed understanding and agreed to proceed.  History of Present Illness: 62 year old female, never smoker followed for acute pulmonary embolism.  She is a patient Dr. Jane Canary and last seen in office 10/12/2022.  Past medical history significant for atherosclerosis, hypertension, GERD, hypothyroid, osteoarthritis, obesity, HLD.  TESTS/EVENTS: 09/18/2022 CTA chest: Acute PE in right main pulmonary artery.  Nonocclusive thrombus in segmental branches of left upper lobe and left lower lobe.  RV/LV ratio is 1.  Groundglass and consolidative opacity in the posterior right lower lobe with some atelectasis, consistent with evolving pulmonary infarct.  1 mm mid right ureteral stone without hydroureteronephrosis.  09/18/2022 echo: EF 65 to 70%.  RV function low normal.  RV size normal.  RA mildly dilated.  Trivial MR.  Mild AR. 01/07/2023 CT chest without contrast: Atherosclerosis.  Heart is enlarged.  Mild scarring in the lower lobes.  No suspicious nodules.  10/12/2022: OV with Dr. Marchelle Gearing for pulmonary consult.  Hospitalized in August 2024 for acute PE.  Considered provoked given COVID-19 infection.  She was started on IV heparin and finally switched to DOAC at discharge.  RV function during her stay was low normal.  Did not require any oxygen therapy.  Feeling improved since admission.  No family history of blood clot or known  personal risk factors.  Works in the [employer].  Still noticing that she gets exhausted and short of breath after being at work for 4 hours or so.  Does not have any issues with lifting objects but as the day wears on, she feels more fatigued.  Better than at the time of discharge.  She did have some new onset right-sided neck pain for months prior to the admission and since being on Eliquis she has had a sense of head fullness but not a headache.  No focal neurological deficits.  Literature reviewed and could not find any side effects consistent with this.  Asked to discuss with primary care provider.  Sit/stand without any hypoxia on room air.  CT imaging during her stay revealed right lower lobe consolidation with groundglass, likely PE infarct.  Recheck D-dimer, BNP and repeat CT chest.  Continue full dose Eliquis for total of 1 year through August 2025 then low-dose for another year through August 2026.  Feels that fatigue is likely post-COVID and post admission.  01/25/2023: Today-follow-up Patient presents today for follow-up to go over her CT scan results.  She had some mild scarring in her bilateral lower lobes but otherwise CT was improved compared to prior.  She does have some atherosclerosis, which appears to be a chronic finding for her.  Review of her last PCP note revealed that she was offered to start statin but she had declined.  Her D-dimer was negative.  BNP had normalized.  She did have some evidence of right heart strain on her echocardiogram during her hospitalization.  She had right atrial dilatation and low normal RV systolic function.  She has not had a repeat echocardiogram since. She tells me today that she has been doing well for the most part.  She still does not feel like she is entirely back to her baseline but she feels better even compared to when she was last seen in September.  Feels like energy levels are slowly improving.  Feels like her breathing is also  slowly improving and getting closer to her normal.  Still has some occasional shortness of breath and feels little more fatigued, especially at the end of the day.  No shortness of breath with rest.  Denies any issues with cough, leg swelling, calf pain, lightheadedness or dizziness.  No excessive bruising or abnormal bleeding.  No new headaches.  Compliant with her Eliquis.  No Known Allergies Immunization History  Administered Date(s) Administered   Influenza,inj,Quad PF,6+ Mos 03/23/2022   Influenza-Unspecified 12/05/2018   PFIZER(Purple Top)SARS-COV-2 Vaccination 06/24/2019, 07/17/2019   Tdap 09/10/2011, 04/14/2017   Zoster Recombinant(Shingrix) 03/23/2022, 07/22/2022   Past Medical History:  Diagnosis Date   Chicken pox    Hypertension    Dx 2009-2010   Kidney stones    Osteopenia    DEXA 11/22/08   Varicose veins of bilateral lower extremities with pain     Tobacco History: Social History   Tobacco Use  Smoking Status Never   Passive exposure: Past  Smokeless Tobacco Never   Counseling given: Not Answered   Outpatient Medications Prior to Visit  Medication Sig Dispense Refill   apixaban (ELIQUIS) 5 MG TABS tablet Take 1 tablet (5 mg total) by mouth 2 (two) times daily. 60 tablet 3   No facility-administered medications prior to visit.     Review of Systems:   Constitutional: No weight loss or gain, night sweats, fevers, chills,  or lassitude.+ fatigue (improved) HEENT: No headaches, difficulty swallowing, tooth/dental problems, or sore throat. No sneezing, itching, ear ache, nasal congestion, or post nasal drip CV:  No chest pain, orthopnea, PND, swelling in lower extremities, anasarca, dizziness, palpitations, syncope Resp: +improving shortness of breath with exertion. No excess mucus or change in color of mucus. No productive or non-productive. No hemoptysis. No wheezing.  No chest wall deformity GI:  No heartburn, indigestion, abdominal pain, nausea, vomiting,  diarrhea, change in bowel habits, loss of appetite, bloody stools.  GU: No dysuria, change in color of urine, urgency or frequency.  No flank pain, no hematuria  Skin: No rash, lesions, ulcerations MSK:  No joint pain or swelling. +chronic neck pain Neuro: No dizziness or lightheadedness.  Psych: No depression or anxiety. Mood stable.   Observations/Objective: Patient is well-developed, well-nourished in no acute distress. A&Ox3. Resting comfortably at home. Unlabored breathing. Speech is clear and coherent with logical content.    Assessment and Plan: Pulmonary embolism (HCC) Acute PE diagnosed in August 2024 in setting of COVID 19 infection. Transitioned to Eliquis upon discharge, which she admits to adherence to. No associated side effects. Possible pulmonary infarct on imaging from 09/2022. Recent repeat CT chest with resolution. She did also have evidence of right heart strain on echocardiogram. BNP has normalized. Will repeat her echo to ensure this has normalized - orders placed today. Plan is to continue full dose anticoagulation for 1 year, ending August 2025, then transition to low dose for 1 year, ending in August 2026. Will have her follow up August 2025 with Dr. Marchelle Gearing to review continued use risks/benefits. Aware of red flag  symptoms and ED precautions. Encouraged to continue working on graded exercises.   Patient Instructions  Your CT chest scan looked good. You do have some plaque buildup in your arteries. Recommend discussing with Dr. [place], your primary, for further recommendations.   We will repeat your echocardiogram of your heart to make sure the strain to the right side of your heart has resolved and it is back to pumping normally. Someone will contact you to schedule this  Continue your Eliquis 5 mg Twice daily. We will keep you on this until August 2025 and then consider decreasing to low dose for another year. You will discuss this at your follow up.  If you develop  any severe, sudden headaches or you are in an accident/have an injury, you need to go to the ED to be evaluated while on this medication.  Monitor for any signs of bleeding or abnormal bruising. You will bruise more easily while taking this.  Continue to work on graded exercises and building your stamina back up  Follow up in August 2025 with Dr. Marchelle Gearing. If symptoms do not improve or worsen, please contact office for sooner follow up or seek emergency care.    DOE (dyspnea on exertion) Clinically improving. Likely post COVID and residual effects from pulmonary emboli/hospitalization.  See above      I discussed the assessment and treatment plan with the patient. The patient was provided an opportunity to ask questions and all were answered. The patient agreed with the plan and demonstrated an understanding of the instructions.   The patient was advised to call back or seek an in-person evaluation if the symptoms worsen or if the condition fails to improve as anticipated.  I provided 31 minutes of non-face-to-face time during this encounter.   Noemi Chapel, NP

## 2023-01-25 NOTE — Assessment & Plan Note (Signed)
Acute PE diagnosed in August 2024 in setting of COVID 19 infection. Transitioned to Eliquis upon discharge, which she admits to adherence to. No associated side effects. Possible pulmonary infarct on imaging from 09/2022. Recent repeat CT chest with resolution. She did also have evidence of right heart strain on echocardiogram. BNP has normalized. Will repeat her echo to ensure this has normalized - orders placed today. Plan is to continue full dose anticoagulation for 1 year, ending August 2025, then transition to low dose for 1 year, ending in August 2026. Will have her follow up August 2025 with Dr. Marchelle Gearing to review continued use risks/benefits. Aware of red flag symptoms and ED precautions. Encouraged to continue working on graded exercises.   Patient Instructions  Your CT chest scan looked good. You do have some plaque buildup in your arteries. Recommend discussing with Dr. [place], your primary, for further recommendations.   We will repeat your echocardiogram of your heart to make sure the strain to the right side of your heart has resolved and it is back to pumping normally. Someone will contact you to schedule this  Continue your Eliquis 5 mg Twice daily. We will keep you on this until August 2025 and then consider decreasing to low dose for another year. You will discuss this at your follow up.  If you develop any severe, sudden headaches or you are in an accident/have an injury, you need to go to the ED to be evaluated while on this medication.  Monitor for any signs of bleeding or abnormal bruising. You will bruise more easily while taking this.  Continue to work on graded exercises and building your stamina back up  Follow up in August 2025 with Dr. Marchelle Gearing. If symptoms do not improve or worsen, please contact office for sooner follow up or seek emergency care.

## 2023-01-25 NOTE — Assessment & Plan Note (Addendum)
Clinically improving. Likely post COVID and residual effects from pulmonary emboli/hospitalization.  See above

## 2023-01-25 NOTE — Patient Instructions (Addendum)
Your CT chest scan looked good. You do have some plaque buildup in your arteries. Recommend discussing with Dr. [place], your primary, for further recommendations.   We will repeat your echocardiogram of your heart to make sure the strain to the right side of your heart has resolved and it is back to pumping normally. Someone will contact you to schedule this  Continue your Eliquis 5 mg Twice daily. We will keep you on this until August 2025 and then consider decreasing to low dose for another year. You will discuss this at your follow up.  If you develop any severe, sudden headaches or you are in an accident/have an injury, you need to go to the ED to be evaluated while on this medication.  Monitor for any signs of bleeding or abnormal bruising. You will bruise more easily while taking this.  Continue to work on graded exercises and building your stamina back up  Follow up in August 2025 with Dr. Marchelle Gearing. If symptoms do not improve or worsen, please contact office for sooner follow up or seek emergency care.

## 2023-02-01 ENCOUNTER — Ambulatory Visit (HOSPITAL_COMMUNITY): Payer: Federal, State, Local not specified - PPO | Attending: Cardiovascular Disease

## 2023-02-01 DIAGNOSIS — I2609 Other pulmonary embolism with acute cor pulmonale: Secondary | ICD-10-CM | POA: Insufficient documentation

## 2023-02-01 LAB — ECHOCARDIOGRAM COMPLETE
Area-P 1/2: 5.34 cm2
P 1/2 time: 394 ms
S' Lateral: 2.7 cm

## 2023-02-04 ENCOUNTER — Other Ambulatory Visit: Payer: Self-pay | Admitting: Nurse Practitioner

## 2023-02-04 DIAGNOSIS — I5189 Other ill-defined heart diseases: Secondary | ICD-10-CM

## 2023-02-21 ENCOUNTER — Emergency Department (HOSPITAL_COMMUNITY): Payer: Federal, State, Local not specified - PPO

## 2023-02-21 ENCOUNTER — Encounter (HOSPITAL_COMMUNITY): Payer: Self-pay

## 2023-02-21 ENCOUNTER — Other Ambulatory Visit: Payer: Self-pay

## 2023-02-21 ENCOUNTER — Emergency Department (HOSPITAL_COMMUNITY)
Admission: EM | Admit: 2023-02-21 | Discharge: 2023-02-21 | Disposition: A | Discharge status: Home / Self Care | Payer: Federal, State, Local not specified - PPO | Attending: Emergency Medicine | Admitting: Emergency Medicine

## 2023-02-21 DIAGNOSIS — I6521 Occlusion and stenosis of right carotid artery: Secondary | ICD-10-CM | POA: Diagnosis not present

## 2023-02-21 DIAGNOSIS — Z7901 Long term (current) use of anticoagulants: Secondary | ICD-10-CM | POA: Insufficient documentation

## 2023-02-21 DIAGNOSIS — I6782 Cerebral ischemia: Secondary | ICD-10-CM | POA: Diagnosis not present

## 2023-02-21 DIAGNOSIS — R531 Weakness: Secondary | ICD-10-CM | POA: Insufficient documentation

## 2023-02-21 DIAGNOSIS — R202 Paresthesia of skin: Secondary | ICD-10-CM | POA: Diagnosis not present

## 2023-02-21 DIAGNOSIS — G459 Transient cerebral ischemic attack, unspecified: Secondary | ICD-10-CM

## 2023-02-21 DIAGNOSIS — R519 Headache, unspecified: Secondary | ICD-10-CM | POA: Diagnosis not present

## 2023-02-21 DIAGNOSIS — R29810 Facial weakness: Secondary | ICD-10-CM | POA: Diagnosis not present

## 2023-02-21 DIAGNOSIS — R29818 Other symptoms and signs involving the nervous system: Secondary | ICD-10-CM | POA: Diagnosis not present

## 2023-02-21 DIAGNOSIS — I10 Essential (primary) hypertension: Secondary | ICD-10-CM | POA: Insufficient documentation

## 2023-02-21 DIAGNOSIS — R200 Anesthesia of skin: Secondary | ICD-10-CM | POA: Diagnosis not present

## 2023-02-21 DIAGNOSIS — R29898 Other symptoms and signs involving the musculoskeletal system: Secondary | ICD-10-CM | POA: Diagnosis not present

## 2023-02-21 LAB — COMPREHENSIVE METABOLIC PANEL
ALT: 15 U/L (ref 0–44)
AST: 19 U/L (ref 15–41)
Albumin: 4 g/dL (ref 3.5–5.0)
Alkaline Phosphatase: 65 U/L (ref 38–126)
Anion gap: 4 — ABNORMAL LOW (ref 5–15)
BUN: 7 mg/dL — ABNORMAL LOW (ref 8–23)
CO2: 27 mmol/L (ref 22–32)
Calcium: 9.7 mg/dL (ref 8.9–10.3)
Chloride: 105 mmol/L (ref 98–111)
Creatinine, Ser: 0.87 mg/dL (ref 0.44–1.00)
GFR, Estimated: >60 mL/min (ref >60)
Glucose, Bld: 100 mg/dL — ABNORMAL HIGH (ref 70–99)
Potassium: 4.2 mmol/L (ref 3.5–5.1)
Sodium: 136 mmol/L (ref 135–145)
Total Bilirubin: 0.9 mg/dL (ref 0.0–1.2)
Total Protein: 7.5 g/dL (ref 6.5–8.1)

## 2023-02-21 LAB — CBC
HCT: 38.5 % (ref 36.0–46.0)
Hemoglobin: 12.6 g/dL (ref 12.0–15.0)
MCH: 29.4 pg (ref 26.0–34.0)
MCHC: 32.7 g/dL (ref 30.0–36.0)
MCV: 90 fL (ref 80.0–100.0)
Platelets: 295 10*3/uL (ref 150–400)
RBC: 4.28 MIL/uL (ref 3.87–5.11)
RDW: 13.4 % (ref 11.5–15.5)
WBC: 5.5 10*3/uL (ref 4.0–10.5)
nRBC: 0 % (ref 0.0–0.2)

## 2023-02-21 LAB — DIFFERENTIAL
Abs Immature Granulocytes: 0.01 10*3/uL (ref 0.00–0.07)
Basophils Absolute: 0 10*3/uL (ref 0.0–0.1)
Basophils Relative: 1 %
Eosinophils Absolute: 0.1 10*3/uL (ref 0.0–0.5)
Eosinophils Relative: 2 %
Immature Granulocytes: 0 %
Lymphocytes Relative: 28 %
Lymphs Abs: 1.5 10*3/uL (ref 0.7–4.0)
Monocytes Absolute: 0.2 10*3/uL (ref 0.1–1.0)
Monocytes Relative: 4 %
Neutro Abs: 3.6 10*3/uL (ref 1.7–7.7)
Neutrophils Relative %: 65 %

## 2023-02-21 LAB — ETHANOL: Alcohol, Ethyl (B): <10 mg/dL (ref <10)

## 2023-02-21 LAB — PROTIME-INR
INR: 1 (ref 0.8–1.2)
Prothrombin Time: 13.5 s (ref 11.4–15.2)

## 2023-02-21 LAB — I-STAT CHEM 8, ED
BUN: 9 mg/dL (ref 8–23)
Calcium, Ion: 1.21 mmol/L (ref 1.15–1.40)
Chloride: 105 mmol/L (ref 98–111)
Creatinine, Ser: 0.9 mg/dL (ref 0.44–1.00)
Glucose, Bld: 95 mg/dL (ref 70–99)
HCT: 38 % (ref 36.0–46.0)
Hemoglobin: 12.9 g/dL (ref 12.0–15.0)
Potassium: 3.9 mmol/L (ref 3.5–5.1)
Sodium: 139 mmol/L (ref 135–145)
TCO2: 26 mmol/L (ref 22–32)

## 2023-02-21 LAB — CBG MONITORING, ED: Glucose-Capillary: 83 mg/dL (ref 70–99)

## 2023-02-21 LAB — APTT: aPTT: 28 s (ref 24–36)

## 2023-02-21 MED ORDER — SODIUM CHLORIDE 0.9% FLUSH
3.0000 mL | Freq: Once | INTRAVENOUS | Status: AC
  Administered 2023-02-21: 3 mL via INTRAVENOUS

## 2023-02-21 MED ORDER — ONDANSETRON HCL 4 MG/2ML IJ SOLN
4.0000 mg | Freq: Once | INTRAMUSCULAR | Status: DC
  Filled 2023-02-21: qty 2

## 2023-02-21 MED ORDER — HYDROMORPHONE HCL 1 MG/ML IJ SOLN
0.5000 mg | Freq: Once | INTRAMUSCULAR | Status: AC
  Administered 2023-02-21: 0.5 mg via INTRAVENOUS
  Filled 2023-02-21: qty 1

## 2023-02-21 MED ORDER — OXYCODONE-ACETAMINOPHEN 5-325 MG PO TABS: 1.0000 | ORAL_TABLET | Freq: Four times a day (QID) | ORAL | 0 refills | Status: DC | PRN

## 2023-02-21 MED ORDER — ACETAMINOPHEN 500 MG PO TABS
1000.0000 mg | ORAL_TABLET | Freq: Once | ORAL | Status: AC
  Administered 2023-02-21: 1000 mg via ORAL
  Filled 2023-02-21: qty 2

## 2023-02-21 MED ORDER — IOHEXOL 350 MG/ML SOLN
75.0000 mL | Freq: Once | INTRAVENOUS | Status: AC | PRN
  Administered 2023-02-21: 75 mL via INTRAVENOUS

## 2023-02-21 NOTE — ED Notes (Signed)
Discharge instructions reviewed with pt and family. Pt verbalized understanding, in no acute distress at time of discharge. Pt denies any chest pain, sob, and/or weakness. Patient alert and oriented x4.

## 2023-02-21 NOTE — Consult Note (Signed)
NEUROLOGY CONSULT NOTE   Date of service: February 21, 2023 Patient Name: Cheyenne Hancock MRN:  [id number] DOB:  1960-12-08 Chief Complaint: Transient left-sided weakness and numbness, headache Requesting Provider: No att. providers found  History of Present Illness  Cheyenne Hancock is a 63 y.o. female  has a past medical history of Chicken pox, History of pulmonary embolus (PE) (09/2022), Hypertension, Kidney stones, Osteopenia, and Varicose veins of bilateral lower extremities with pain. Patient is compliant with her Eliquis at home.  Patient states that she was at church this afternoon at 12:30 PM when she began to feel numbness of her lips on the left side of her mouth and developed a frontal and posterior pounding headache without history of such.  She states that as she was walking out of her church on the way to another church service, she was feeling weak especially on the left leg causing her to walk off balanced and stagger.  While in her second church service at 1:30 PM, her symptoms worsened with development of spotting vision and she went to her daughter's house.  While at her daughter's house, her symptoms progressed and she was brought to the ED for evaluation.  While in the ED, Ms. Sroufe fell to the floor while transferring into a chair due to left leg weakness and her blood pressure at that time was elevated to 230 mmHg systolic.  Patient's daughter noted that the patient's left arm was weak with patient complaints of left arm numbness and she developed a noticeable left facial droop per her daughter at bedside.  As her symptoms progressed in the ED, patient notified ED staff and a code stroke was activated.  On neurology evaluation, patient states that her symptoms have since resolved.    LKW: 12:30 PM today Modified rankin score: 0-Completely asymptomatic and back to baseline post- stroke IV Thrombolysis: No, patient is compliant on Eliquis at home, last dose at 1  AM  EVT: No, patient's symptoms are not consistent with an LVO.   NIHSS components Score: Comment  1a Level of Conscious 0[x]  1[]  2[]  3[]      1b LOC Questions 0[x]  1[]  2[]       1c LOC Commands 0[x]  1[]  2[]       2 Best Gaze 0[x]  1[]  2[]       3 Visual 0[x]  1[]  2[]  3[]      4 Facial Palsy 0[x]  1[]  2[]  3[]      5a Motor Arm - left 0[x]  1[]  2[]  3[]  4[]  UN[]    5b Motor Arm - Right 0[x]  1[]  2[]  3[]  4[]  UN[]    6a Motor Leg - Left 0[]  1[x]  2[]  3[]  4[]  UN[]    6b Motor Leg - Right 0[x]  1[]  2[]  3[]  4[]  UN[]    7 Limb Ataxia 0[x]  1[]  2[]  3[]  UN[]     8 Sensory 0[x]  1[]  2[]  UN[]      9 Best Language 0[x]  1[]  2[]  3[]      10 Dysarthria 0[x]  1[]  2[]  UN[]      11 Extinct. and Inattention 0[x]  1[]  2[]       TOTAL:  1    ROS  Comprehensive ROS performed and pertinent positives documented in HPI   Past History   Past Medical History:  Diagnosis Date   Chicken pox    History of pulmonary embolus (PE) 09/2022   Hypertension    Dx 2009-2010   Kidney stones    Osteopenia    DEXA 11/22/08   Varicose veins of bilateral lower extremities with pain  Past Surgical History:  Procedure Laterality Date   TUBAL LIGATION  11/24/1984   Family History: Family History  Problem Relation Age of Onset   Arthritis Mother    Stroke Mother    Hypertension Mother    Cancer Sister        breast   Breast cancer Sister    Cancer Paternal Uncle        lung and prostate   Breast cancer Maternal Grandmother    Stomach cancer Maternal Aunt    Colon cancer Neg Hx    Esophageal cancer Neg Hx    Liver cancer Neg Hx    Pancreatic cancer Neg Hx    Rectal cancer Neg Hx    Social History  reports that she has never smoked. She has been exposed to tobacco smoke. She has never used smokeless tobacco. She reports that she does not drink alcohol and does not use drugs.  No Known Allergies  Medications   Current Facility-Administered Medications:    sodium chloride flush (NS) 0.9 % injection 3 mL, 3 mL, Intravenous,  Once, Pfeiffer, Marcy, MD  Current Outpatient Medications:    apixaban (ELIQUIS) 5 MG TABS tablet, Take 1 tablet (5 mg total) by mouth 2 (two) times daily., Disp: 60 tablet, Rfl: 3  Vitals   Vitals:   02/21/23 1539 02/21/23 1603 02/21/23 1630 02/21/23 1641  BP:  (!) 230/88 (!) 183/62   Pulse:  91 84   Resp:  15 18   Temp:  99 F (37.2 C)    SpO2:  98% 100%   Weight: 87.5 kg   90.8 kg  Height: 5\' 2"  (1.575 m)       Body mass index is 36.61 kg/m.  Physical Exam   Constitutional: Well-developed and well-nourished [place] female patient in no acute distress Psych: Affect appropriate to situation.  Patient is calm and cooperative with exam. Eyes: No scleral injection.  HENT: No OP obstruction.  Head: Normocephalic.  Cardiovascular: Normal rate and regular rhythm.  Respiratory: Effort normal, non-labored breathing on room air.  GI: Soft.  No distension. There is no tenderness.  Skin: WDI.   Neurologic Examination  Mental Status: Patient is awake, alert, oriented to person, place, month, year, and situation. Patient is able to give a clear and coherent history. No signs of aphasia or neglect Cranial Nerves: II: Visual Fields are full. Pupils are equal, round, and reactive to light.  Patient intermittently struggles with counting fingers on the right inferior visual field with double visual stimulation testing.  III,IV, VI: EOMI without ptosis or diploplia.  V: Facial sensation is intact and symmetric to light touch, initially patient reports "just a touch" of left facial minimal decreased sensation but on repeat assessment reports sensory disturbance had completely resolved VII: Face is symmetric resting and with movement (prior left facial droop resolved) VIII: Hearing is intact to voice X: Palate elevates symmetrically XI: Shoulder shrug is symmetric. XII: Tongue protrudes midline  Motor: Tone is normal. Bulk is normal. 5/5 strength was present in bilateral upper  and right lower extremities. Mild weakness of the left lower extremity noted with very minimal vertical drift with sustained leg elevation.  4/5 on left hip flexion testing, otherwise 5/5 Sensory: Sensation is symmetric to light touch  in the arms and legs. No extinction to DSS present.  Deep Tendon Reflexes: 2+ and symmetric in the biceps and patellae.  Plantars: Toes are downgoing bilaterally.  Cerebellar: FNF and HKS are intact bilaterally  Labs/Imaging/Neurodiagnostic  studies   CBC:  Recent Labs  Lab 03/19/23 1544 03-19-23 1555  WBC 5.5  --   NEUTROABS 3.6  --   HGB 12.6 12.9  HCT 38.5 38.0  MCV 90.0  --   PLT 295  --    Basic Metabolic Panel:  Lab Results  Component Value Date   NA 139 19-Mar-2023   K 3.9 03/19/2023   CO2 23 09/19/2022   GLUCOSE 95 2023/03/19   BUN 9 03-19-2023   CREATININE 0.90 03-19-23   CALCIUM 8.7 (L) 09/19/2022   GFRNONAA >60 09/19/2022   GFRAA >60 11/14/2018   Lipid Panel:  Lab Results  Component Value Date   LDLCALC 152 (H) 03/24/2022   HgbA1c:  Lab Results  Component Value Date   HGBA1C 5.8 03/24/2022   Urine Drug Screen: No results found for: "LABOPIA", "COCAINSCRNUR", "LABBENZ", "AMPHETMU", "THCU", "LABBARB"  Alcohol Level No results found for: "ETH" INR  Lab Results  Component Value Date   INR 1.0 March 19, 2023   APTT  Lab Results  Component Value Date   APTT 28 19-Mar-2023   CT Head without contrast(Personally reviewed): No acute intracranial process  CT angio Head and Neck with contrast:  1. No intracranial large vessel occlusion. 2. Mild atherosclerotic change at the right ICA bulb but without stenosis. 3. Left PCA takes fetal origin from the anterior circulation. There is some narrowing and irregularity within the PCA branches, worse on the right than the left.  MRI Brain: Ordered, pending radiology report. no acute intracranial process on attending MD review  ASSESSMENT   Ailani Fahim is a 63  y.o. female  has a past medical history of Chicken pox, History of pulmonary embolus (PE) (09/2022), Hypertension, Kidney stones, Osteopenia, and Varicose veins of bilateral lower extremities with pain. On home Eliquis who presented to the ED March 19, 2023 for evaluation of left-sided weakness and sensory disturbances with associated pounding frontal and posterior headache and spotted vision today while in church.  Patient complains of variable degrees of left face, arm, and leg weakness and numbness since onset at 12:30 PM today with resolution on neurology evaluation patient is not a candidate for TNKase as she is compliant with her Eliquis therapy at home.  Low suspicion for LVO with patient's mild left lower extremity weakness as her only neurologic exam finding on evaluation.  Ddx includes stroke versus TIA versus complex migraine headache.  Stroke risk factors include: age, obesity, history of PE on Eliquis, hypertension, family history of stroke (patient's mother), untreated hyperlipidemia.  RECOMMENDATIONS  - HgbA1c, fasting lipid panel  (last assessed 11 months ago based on our EMR but patient reports recently checked by PCP and recommended to start atorvastatin; this can be followed up outpatient)   -Goal LDL less than 70 - MRI of the brain without contrast negative for acute intracranial process on neurology MD review, radiology report pending - Permissive hypertension pending confirmation of negative MRI brain - CTA head and neck to confirm no critical stenosis though she does have some atherosclerotic disease - From a neurological perspective, echocardiogram would not change management as she already has an indication for long-term anticoagulation.  Defer to ED/primary team if this is felt to be needed from a medical perspective - Headache treatment per ED - From a stroke risk perspective if MRI brain is negative it is safe for her to continue Eliquis and she is essentially medically maximized on  Eliquis (though agree with starting cholesterol medication for goal LDL less than 70,  and ensuring A1c is also meeting goal less than 7%) - If her symptoms continue to fluctuate, recommend observation as small vessel disease stroke with fluctuating symptoms is possible and she could need further PT/OT.  However no clear data on benefit of adding aspirin to anticoagulation at this time. - If symptoms have continued to be stable / resolved, patient can ambulate steadily and does not feel she needs PT/OT evaluation, she may be discharged with close follow-up with her PCP from a neurologic perspective ______________________________________________________________________  Signed, Kara Mead, NP [facility]  Attending Neurologist's note:  I personally saw this patient, gathering history, performing a full neurologic examination, reviewing relevant labs, personally reviewing relevant imaging including head CT, CTA head and neck, MRI brain, and formulated the assessment and plan, adding the note above for completeness and clarity to accurately reflect my thoughts   Brooke Dare MD-PhD [facility] [phone]  CRITICAL CARE Performed by: Gordy Councilman   Total critical care time: 35 minutes  Critical care time was exclusive of separately billable procedures and treating other patients.  Critical care was necessary to treat or prevent imminent or life-threatening deterioration -- emergent evaluation for acute neurological changes, consideration of thrombectomies/thrombolytics  Critical care was time spent personally by me on the following activities: development of treatment plan with patient and/or surrogate as well as nursing, discussions with consultants, evaluation of patient's response to treatment, examination of patient, obtaining history from patient or surrogate, ordering and performing treatments and interventions, ordering and review of laboratory  studies, ordering and review of radiographic studies, pulse oximetry and re-evaluation of patient's condition.

## 2023-02-21 NOTE — ED Notes (Signed)
Pt family member came to me and told me that the pt had feel on the floor trying to get in the chair and that she needs help getting her off the floor. I went and helped the pt of the floor. Pt stated that her leg just gave out on her and she lost her balance. I reassessed her vitals pt was crying. Pt family member was upset because her bp went up and that something needs to be done. I told her we are still waiting on the PA to come see her and that I'm going to informed the nurse.     I informed triage RN about what happened with the pt.

## 2023-02-21 NOTE — ED Notes (Signed)
Pt advising symptoms are worsening. NIH reassessed as noted. Secretary notified to call Code Stroke.

## 2023-02-21 NOTE — ED Provider Notes (Signed)
[place] EMERGENCY DEPARTMENT AT [facility] Provider Note   CSN: [id number] Arrival date & time: 02/21/23  1530  An emergency department physician performed an initial assessment on this suspected stroke patient at 1636.  History  Chief Complaint  Patient presents with   Headache   Numbness    Cheyenne Hancock is a 63 y.o. female.  HPI As per Dr. Rollene Fare HPI at code stroke, I have confirmed the following with the patient and this is history: Cheyenne Hancock is a 63 y.o. female  has a past medical history of Chicken pox, History of pulmonary embolus (PE) (09/2022), Hypertension, Kidney stones, Osteopenia, and Varicose veins of bilateral lower extremities with pain. Patient is compliant with her Eliquis at home.  Patient states that she was at church this afternoon at 12:30 PM when she began to feel numbness of her lips on the left side of her mouth and developed a frontal and posterior pounding headache without history of such.  She states that as she was walking out of her church on the way to another church service, she was feeling weak especially on the left leg causing her to walk off balanced and stagger.  While in her second church service at 1:30 PM, her symptoms worsened with development of spotting vision and she went to her daughter's house.  While at her daughter's house, her symptoms progressed and she was brought to the ED for evaluation.  While in the ED, Cheyenne Hancock fell to the floor while transferring into a chair due to left leg weakness and her blood pressure at that time was elevated to 230 mmHg systolic.  Patient's daughter noted that the patient's left arm was weak with patient complaints of left arm numbness and she developed a noticeable left facial droop per her daughter at bedside.  As her symptoms progressed in the ED, patient notified ED staff and a code stroke was activated.  On neurology evaluation, patient states that her symptoms have  since resolved.     Home Medications Prior to Admission medications   Medication Sig Start Date End Date Taking? Authorizing Provider  oxyCODONE-acetaminophen (PERCOCET/ROXICET) 5-325 MG tablet Take 1 tablet by mouth every 6 (six) hours as needed for severe pain (pain score 7-10). 02/21/23  Yes Arby Barrette, MD  apixaban (ELIQUIS) 5 MG TABS tablet Take 1 tablet (5 mg total) by mouth 2 (two) times daily. 09/28/22   Meredeth Ide, MD      Allergies    Patient has no known allergies.    Review of Systems   Review of Systems  Physical Exam Updated Vital Signs BP (!) 148/69   Pulse 69   Temp 99 F (37.2 C)   Resp 10   Ht 5\' 2"  (1.575 m)   Wt 90.8 kg   SpO2 99%   BMI 36.61 kg/m  Physical Exam Constitutional:      Comments: Is examined after first evaluation for code stroke.  At the time of my evaluation, all neurologic symptoms have resolved.  Patient is alert and interactive.  No acute distress.  Nourished well-developed  HENT:     Head: Normocephalic and atraumatic.     Mouth/Throat:     Mouth: Mucous membranes are moist.     Pharynx: Oropharynx is clear.  Eyes:     Extraocular Movements: Extraocular movements intact.     Pupils: Pupils are equal, round, and reactive to light.  Cardiovascular:     Rate and Rhythm: Normal  rate and regular rhythm.  Pulmonary:     Effort: Pulmonary effort is normal.     Breath sounds: Normal breath sounds.  Abdominal:     General: There is no distension.     Palpations: Abdomen is soft.     Tenderness: There is no abdominal tenderness. There is no guarding.  Musculoskeletal:        General: Normal range of motion.     Cervical back: Neck supple.     Right lower leg: No edema.     Left lower leg: No edema.  Skin:    General: Skin is warm and dry.  Neurological:     General: No focal deficit present.     Mental Status: She is oriented to person, place, and time.     Cranial Nerves: No cranial nerve deficit.     Motor: No weakness.      Coordination: Coordination normal.     Gait: Gait normal.  Psychiatric:        Mood and Affect: Mood normal.     ED Results / Procedures / Treatments   Labs (all labs ordered are listed, but only abnormal results are displayed) Labs Reviewed  COMPREHENSIVE METABOLIC PANEL - Abnormal; Notable for the following components:      Result Value   Glucose, Bld 100 (*)    BUN 7 (*)    Anion gap 4 (*)    All other components within normal limits  PROTIME-INR  APTT  CBC  DIFFERENTIAL  ETHANOL  I-STAT CHEM 8, ED  CBG MONITORING, ED    EKG EKG Interpretation Date/Time:  Sunday February 21 2023 15:46:58 EST Ventricular Rate:  65 PR Interval:  130 QRS Duration:  84 QT Interval:  378 QTC Calculation: 393 R Axis:   64  Text Interpretation: Normal sinus rhythm Normal ECG When compared with ECG of 17-Sep-2022 21:10, PREVIOUS ECG IS PRESENT Confirmed by Edwin Dada ([id number]) on 02/23/2023 3:04:01 PM  Radiology MR BRAIN WO CONTRAST Result Date: 02/21/2023 CLINICAL DATA:  Acute neurologic deficit EXAM: MRI HEAD WITHOUT CONTRAST TECHNIQUE: Multiplanar, multiecho pulse sequences of the brain and surrounding structures were obtained without intravenous contrast. COMPARISON:  None Available. FINDINGS: Brain: No acute infarct, mass effect or extra-axial collection. No acute or chronic hemorrhage. There is multifocal hyperintense T2-weighted signal within the white matter. Parenchymal volume and CSF spaces are normal. The midline structures are normal. Vascular: Normal flow voids. Skull and upper cervical spine: Normal calvarium and skull base. Visualized upper cervical spine and soft tissues are normal. Sinuses/Orbits:No paranasal sinus fluid levels or advanced mucosal thickening. No mastoid or middle ear effusion. Normal orbits. IMPRESSION: 1. No acute intracranial abnormality. 2. Findings of chronic small vessel ischemia. Electronically Signed   By: Deatra Robinson M.D.   On: 02/21/2023 19:38   CT  ANGIO HEAD NECK W WO CM Result Date: 02/21/2023 CLINICAL DATA:  Neuro deficit, acute, stroke suspected. Left face, arm and leg numbness. Left leg weakness. EXAM: CT ANGIOGRAPHY HEAD AND NECK WITH AND WITHOUT CONTRAST TECHNIQUE: Multidetector CT imaging of the head and neck was performed using the standard protocol during bolus administration of intravenous contrast. Multiplanar CT image reconstructions and MIPs were obtained to evaluate the vascular anatomy. Carotid stenosis measurements (when applicable) are obtained utilizing NASCET criteria, using the distal internal carotid diameter as the denominator. RADIATION DOSE REDUCTION: This exam was performed according to the departmental dose-optimization program which includes automated exposure control, adjustment of the mA and/or kV according to patient  size and/or use of iterative reconstruction technique. CONTRAST:  75mL OMNIPAQUE IOHEXOL 350 MG/ML SOLN COMPARISON:  Head CT same day FINDINGS: CTA NECK FINDINGS Aortic arch: Minimal atherosclerotic calcification. Branching pattern is normal without origin stenosis. Right carotid system: Common carotid artery widely patent to the bifurcation. Mild calcified plaque in the ICA bulb but no stenosis when compared to the more distal cervical ICA. Left carotid system: Common carotid artery widely patent to the bifurcation. Mild soft plaque in the distal common carotid artery but no stenosis. Carotid bifurcation itself and ICA bulb appear normal an widely patent. Vertebral arteries: Both vertebral artery origins are widely patent. Both vertebral arteries appear normal through the cervical region to the foramen magnum. Skeleton: Normal Other neck: No mass or lymphadenopathy. Upper chest: Normal Review of the MIP images confirms the above findings CTA HEAD FINDINGS Anterior circulation: Both internal carotid arteries are patent through the skull base and siphon regions. The anterior and middle cerebral vessels patent. No  stenosis or occlusion. No aneurysm or vascular malformation. Posterior circulation: Both vertebral arteries widely patent to the basilar artery. No basilar stenosis. Posterior circulation branch vessels show flow. Left PCA takes fetal origin from the anterior circulation. There is some narrowing and irregularity within the PCA branches, worse on the right than the left. Venous sinuses: Patent and normal. Anatomic variants: None significant. Review of the MIP images confirms the above findings IMPRESSION: 1. No intracranial large vessel occlusion. 2. Mild atherosclerotic change at the right ICA bulb but without stenosis. 3. Left PCA takes fetal origin from the anterior circulation. There is some narrowing and irregularity within the PCA branches, worse on the right than the left. Aortic Atherosclerosis (ICD10-I70.0). Electronically Signed   By: Paulina Fusi M.D.   On: 02/21/2023 17:33   CT HEAD WO CONTRAST Result Date: 02/21/2023 CLINICAL DATA:  Headache, left-sided weakness. EXAM: CT HEAD WITHOUT CONTRAST TECHNIQUE: Contiguous axial images were obtained from the base of the skull through the vertex without intravenous contrast. RADIATION DOSE REDUCTION: This exam was performed according to the departmental dose-optimization program which includes automated exposure control, adjustment of the mA and/or kV according to patient size and/or use of iterative reconstruction technique. COMPARISON:  None Available. FINDINGS: Brain: No evidence of acute infarction, hemorrhage, hydrocephalus, extra-axial collection or mass lesion/mass effect. Vascular: No hyperdense vessel or unexpected calcification. Skull: Normal. Negative for fracture or focal lesion. Sinuses/Orbits: No acute finding. Other: None. IMPRESSION: No acute intracranial process. Electronically Signed   By: Romona Curls M.D.   On: 02/21/2023 16:47    Procedures Procedures    Medications Ordered in ED Medications  HYDROmorphone (DILAUDID) injection  0.5 mg (has no administration in time range)  ondansetron (ZOFRAN) injection 4 mg (has no administration in time range)  acetaminophen (TYLENOL) tablet 1,000 mg (has no administration in time range)  sodium chloride flush (NS) 0.9 % injection 3 mL (3 mLs Intravenous Given 02/21/23 1721)  iohexol (OMNIPAQUE) 350 MG/ML injection 75 mL (75 mLs Intravenous Contrast Given 02/21/23 1701)    ED Course/ Medical Decision Making/ A&P                                 Medical Decision Making Amount and/or Complexity of Data Reviewed Labs: ordered. Radiology: ordered.  Risk OTC drugs. Prescription drug management.   21: 20 all neurologic symptoms have completely resolved.  I repeated neurologic examination.  The patient has been up and ambulatory  to the bathroom and around imaging studies without any residual neurologic dysfunction.  She does have some residual headache.  I have ordered treatment with a half a milligram Dilaudid and Zofran.  Due to being on Eliquis patient is not a candidate for nonsteroidals.  At this time I have reviewed all of her diagnostic imaging and notes from neurology.  Dr. Judie Bonus indicated that if the patient had no residual neurologic dysfunction discharge with close follow-up would be reasonable.  At this time patient's blood pressures have normalized, she has no residual neurologic symptoms and she is generally well in appearance.  I have discussed the possibility of complex migraine however with no prior history of such, she will need close follow-up and monitoring.  She and family members are aware of plan and agreeable.  Also extensively discussed the patient's blood pressure.  Her initial blood pressures were very hypertensive.  This is not typical for the patient by her report.  Pressures have normalized over the course of her hospitalization.  We discussed monitoring closely at home with documenting blood pressures and if pressures are starting to escalate, she is to  return immediately for recheck.  Patient voices understanding.  She will be following up with her PCP to discuss management and close monitoring for hypertension        Final Clinical Impression(s) / ED Diagnoses Final diagnoses:  Acute nonintractable headache, unspecified headache type  Bad headache  Acute left-sided weakness    Rx / DC Orders ED Discharge Orders          Ordered    oxyCODONE-acetaminophen (PERCOCET/ROXICET) 5-325 MG tablet  Every 6 hours PRN        02/21/23 2115    Ambulatory referral to Neurology       Comments: An appointment is requested in approximately: 1 week   02/21/23 2117              Arby Barrette, MD 02/28/23 1101

## 2023-02-21 NOTE — Discharge Instructions (Signed)
1.  At this time it appears likely you had a severe migraine headache with strokelike symptoms.  You had an MRI and CT angiograms that did not show any evidence of a stroke or an aneurysm or a tumor.  Sometimes migraine headaches can cause weakness and numbness that looks very similar to a stroke.  At this time, those symptoms have resolved.  You are going to be treated with the pain medication.  It is however very important that you get seen by a neurologist in follow-up.  You have been given an ambulatory referral to [facility].  They can evaluate you for other possible causes for the symptoms as well as get appropriate treatment for migraine if needed.  Since this is a very new diagnosis for you, you need to return to the emergency department immediately if you have any recurrence of the same symptoms for a recheck until it is clearly established that you have a complex migraine diagnosis. 2.  You have been given information about migraine headaches, reviewed these and try to control symptoms.  You have been given a prescription for Percocet to take for a bad headache 1 every 6 hours if needed.  You should try to use this very sparingly and instead use extra strength Tylenol every 6 hours with rest and a darkened room.  Due to being treated with Eliquis you cannot use ibuprofen or aspirin.

## 2023-02-21 NOTE — ED Triage Notes (Addendum)
Pt c.o severe headache and dizziness that started after church around 1pm today with left sided facial numbness that radiates down her left arm. Pt also c.o left arm weakness, no weakness noted on exam. Pt also c.o seeing spots since these symptoms started. Triage PA Sabra Heck made aware of the pt needing screening upon pts arrival to triage

## 2023-02-21 NOTE — ED Notes (Signed)
This pt to CT with this RN to monitor.

## 2023-02-21 NOTE — ED Notes (Signed)
Patient up to bathroom with minimal assist. Tolerated well. Patient denies dizziness.

## 2023-02-21 NOTE — ED Notes (Signed)
Pt now reports numbness to L side of face is resolving. L sided weakness has resolved as well at this time.

## 2023-02-23 ENCOUNTER — Encounter: Payer: Self-pay | Admitting: Family Medicine

## 2023-02-23 ENCOUNTER — Encounter: Payer: Self-pay | Admitting: Internal Medicine

## 2023-02-23 ENCOUNTER — Ambulatory Visit: Payer: Federal, State, Local not specified - PPO | Admitting: Family Medicine

## 2023-02-23 ENCOUNTER — Ambulatory Visit: Payer: Self-pay | Admitting: Family Medicine

## 2023-02-23 ENCOUNTER — Ambulatory Visit: Payer: Federal, State, Local not specified - PPO | Attending: Internal Medicine | Admitting: Internal Medicine

## 2023-02-23 VITALS — BP 132/80 | HR 88 | Resp 16 | Ht 62.0 in | Wt 198.0 lb

## 2023-02-23 VITALS — BP 120/64 | HR 67 | Ht 62.0 in | Wt 198.0 lb

## 2023-02-23 DIAGNOSIS — E785 Hyperlipidemia, unspecified: Secondary | ICD-10-CM | POA: Diagnosis not present

## 2023-02-23 DIAGNOSIS — I2699 Other pulmonary embolism without acute cor pulmonale: Secondary | ICD-10-CM

## 2023-02-23 DIAGNOSIS — R202 Paresthesia of skin: Secondary | ICD-10-CM | POA: Diagnosis not present

## 2023-02-23 DIAGNOSIS — R519 Headache, unspecified: Secondary | ICD-10-CM | POA: Diagnosis not present

## 2023-02-23 DIAGNOSIS — I10 Essential (primary) hypertension: Secondary | ICD-10-CM | POA: Diagnosis not present

## 2023-02-23 DIAGNOSIS — I700 Atherosclerosis of aorta: Secondary | ICD-10-CM

## 2023-02-23 DIAGNOSIS — R0609 Other forms of dyspnea: Secondary | ICD-10-CM

## 2023-02-23 MED ORDER — ROSUVASTATIN CALCIUM 10 MG PO TABS: 10.0000 mg | ORAL_TABLET | Freq: Every day | ORAL | 1 refills | Status: DC

## 2023-02-23 NOTE — Patient Instructions (Addendum)
A few things to remember from today's visit:  Headache, unspecified headache type - Plan: Ambulatory referral to Neurology  Facial paresthesia - Plan: Ambulatory referral to Neurology Stating can be stated after this headache resolves.  If you need refills for medications you take chronically, please call your pharmacy. Do not use My Chart to request refills or for acute issues that need immediate attention. If you send a my chart message, it may take a few days to be addressed, specially if I am not in the office.  Please be sure medication list is accurate. If a new problem present, please set up appointment sooner than planned today.

## 2023-02-23 NOTE — Assessment & Plan Note (Signed)
Seen on abdominal/chest CTA on 09/17/22. Rosuvastatin 10 mg daily recommended.

## 2023-02-23 NOTE — Telephone Encounter (Signed)
Pt's appt moved to today at 4:30 due to worsening headache.

## 2023-02-23 NOTE — Progress Notes (Signed)
ACUTE VISIT Chief Complaint  Patient presents with   Follow-up    ER follow up    Headache    Started on Sunday    HPI: [name] is a 63 y.o. female with a PMHx significant for pulmonary embolism on anticoagulation, HTN, atherosclerosis of aorta, GERD, hypothyroidism, OA, nephrolithiases, and HLD, among others, who is here today with her 2 daughters for ED follow up.  Seen in ED on 02/21/2023 with acute headache and left-sided weakness. In the ED she had a head CT and MRI.   She states she had a headache that started on 1/19 while at church in the top and back of her head, accompanied by some lip and left sided facial numbness and weakness.  She went to her daughter's house for about 30 minutes until the headache dissipated, but when driving home began to feel weak again and started seeing "black spots" on both visual fields.  After this she returned to her daughter's house and then wen to the ED.   In the ED, she says her systolic BP was 200 and she had a fall trying to get into a chair because her left side "gave out."  Additionally her headache returned throughout her ED visit at a level 10/10, and she vomited after being given an IV pain medication.  No prior hx of headaches.  Head CT, head and neck CTA, and brain MRI done. Head and neck CTA: 1. No intracranial large vessel occlusion. 2. Mild atherosclerotic change at the right ICA bulb but without stenosis. 3. Left PCA takes fetal origin from the anterior circulation. There is some narrowing and irregularity within the PCA branches, worse on the right than the left. Aortic Atherosclerosis (ICD10-I70.0)  Brain MRI:  1. No acute intracranial abnormality. 2. Findings of chronic small vessel ischemia. Since returning home, she is still having occasional headaches that last for 30-60 minutes, as well as sometimes seeing spots and feeling "brain fog". When she has the headaches, she also has lip numbness.  No  focal weakness. Lab Results  Component Value Date   NA 139 02/21/2023   CL 105 02/21/2023   K 3.9 02/21/2023   CO2 27 02/21/2023   BUN 9 02/21/2023   CREATININE 0.90 02/21/2023   GFRNONAA >60 02/21/2023   CALCIUM 9.7 02/21/2023   ALBUMIN 4.0 02/21/2023   GLUCOSE 95 02/21/2023   Lab Results  Component Value Date   WBC 5.5 02/21/2023   HGB 12.9 02/21/2023   HCT 38.0 02/21/2023   MCV 90.0 02/21/2023   PLT 295 02/21/2023   Lab Results  Component Value Date   ALT 15 02/21/2023   AST 19 02/21/2023   ALKPHOS 65 02/21/2023   BILITOT 0.9 02/21/2023   Lab Results  Component Value Date   INR 1.0 02/21/2023   She has also had some dizziness and balance issues when standing up. She doe snot need assistance. She says extra strength tylenol helps significantly with the headaches.  No new medications or dietary changes.  She denies associated photophobia, nausea, vomiting, or focal weakness. Her last eye exam 2-3 years ago  She has been checking her BP at home, and says it has been 120-130/60s.   HLD and aortic atherosclerosis: She has already had an appointment with cardiology, who has recommended she start a statin for aortic atherosclerosis. She is inquiring about which medication she should try. Lab Results  Component Value Date   CHOL 225 (H) 03/24/2022   HDL 57.80  03/24/2022   LDLCALC 152 (H) 03/24/2022   TRIG 76.0 03/24/2022   CHOLHDL 4 03/24/2022   Review of Systems  Constitutional:  Negative for appetite change, chills and fever.  HENT:  Negative for facial swelling and sore throat.   Respiratory:  Negative for cough, shortness of breath and wheezing.   Cardiovascular:  Negative for chest pain, palpitations and leg swelling.  Gastrointestinal:  Negative for abdominal pain.  Genitourinary:  Negative for decreased urine volume, dysuria and hematuria.  Skin:  Negative for rash.  Neurological:  Negative for syncope and facial asymmetry.  Psychiatric/Behavioral:   Negative for confusion and hallucinations.   See other pertinent positives and negatives in HPI.  Current Outpatient Medications on File Prior to Visit  Medication Sig Dispense Refill   apixaban (ELIQUIS) 5 MG TABS tablet Take 1 tablet (5 mg total) by mouth 2 (two) times daily. 60 tablet 3   oxyCODONE-acetaminophen (PERCOCET/ROXICET) 5-325 MG tablet Take 1 tablet by mouth every 6 (six) hours as needed for severe pain (pain score 7-10). 15 tablet 0   No current facility-administered medications on file prior to visit.   Past Medical History:  Diagnosis Date   Chicken pox    History of pulmonary embolus (PE) 09/2022   Hypertension    Dx 2009-2010   Kidney stones    Osteopenia    DEXA 11/22/08   Varicose veins of bilateral lower extremities with pain    No Known Allergies  Social History   Socioeconomic History   Marital status: Divorced    Spouse name: Not on file   Number of children: 2   Years of education: Not on file   Highest education level: Not on file  Occupational History   Not on file  Tobacco Use   Smoking status: Never    Passive exposure: Past   Smokeless tobacco: Never  Vaping Use   Vaping status: Never Used  Substance and Sexual Activity   Alcohol use: No   Drug use: No   Sexual activity: Not Currently    Birth control/protection: Post-menopausal  Other Topics Concern   Not on file  Social History Narrative   Not on file   Social Drivers of Health   Financial Resource Strain: Patient Declined (06/12/2022)   Overall Financial Resource Strain (CARDIA)    Difficulty of Paying Living Expenses: Patient declined  Food Insecurity: No Food Insecurity (09/22/2022)   Hunger Vital Sign    Worried About Running Out of Food in the Last Year: Never true    Ran Out of Food in the Last Year: Never true  Transportation Needs: No Transportation Needs (09/22/2022)   PRAPARE - Administrator, Civil Service (Medical): No    Lack of Transportation  (Non-Medical): No  Physical Activity: Sufficiently Active (06/12/2022)   Exercise Vital Sign    Days of Exercise per Week: 5 days    Minutes of Exercise per Session: 150+ min  Stress: No Stress Concern Present (06/12/2022)   [employer] of Occupational Health - Occupational Stress Questionnaire    Feeling of Stress : Not at all  Social Connections: Moderately Integrated (06/12/2022)   Social Connection and Isolation Panel [NHANES]    Frequency of Communication with Friends and Family: More than three times a week    Frequency of Social Gatherings with Friends and Family: Once a week    Attends Religious Services: More than 4 times per year    Active Member of [employer] or Organizations: Yes  Attends Banker Meetings: More than 4 times per year    Marital Status: Divorced   Vitals:   02/23/23 1611  BP: 132/80  Pulse: 88  Resp: 16  SpO2: 98%   Body mass index is 36.21 kg/m.  Physical Exam Vitals and nursing note reviewed.  Constitutional:      General: She is not in acute distress.    Appearance: She is well-developed.  HENT:     Head: Normocephalic and atraumatic.     Mouth/Throat:     Mouth: Mucous membranes are moist.     Pharynx: Oropharynx is clear.  Eyes:     Extraocular Movements: Extraocular movements intact.     Conjunctiva/sclera: Conjunctivae normal.     Pupils: Pupils are equal, round, and reactive to light.     Funduscopic exam:    Right eye: No hemorrhage or exudate.        Left eye: No hemorrhage or exudate.     Comments: Not able to evaluate for papilledema.  Cardiovascular:     Rate and Rhythm: Normal rate and regular rhythm.     Heart sounds: No murmur heard. Pulmonary:     Effort: Pulmonary effort is normal. No respiratory distress.     Breath sounds: Normal breath sounds.  Abdominal:     Palpations: Abdomen is soft. There is no mass.     Tenderness: There is no abdominal tenderness.  Musculoskeletal:     Cervical back: Neck  supple.  Lymphadenopathy:     Cervical: No cervical adenopathy.  Skin:    General: Skin is warm.     Findings: No erythema or rash.  Neurological:     General: No focal deficit present.     Mental Status: She is alert and oriented to person, place, and time.     Cranial Nerves: No cranial nerve deficit.     Motor: No tremor or pronator drift.     Gait: Gait normal.     Deep Tendon Reflexes:     Reflex Scores:      Bicep reflexes are 2+ on the right side and 2+ on the left side.      Patellar reflexes are 2+ on the right side and 2+ on the left side. Psychiatric:        Mood and Affect: Mood and affect normal.    ASSESSMENT AND PLAN:  Ms. Kolin was seen today for ER follow up for ED visit.  Headache, unspecified headache type She is not having headache at this time. Recent head CT and brain MRI negative for acute abnormalities. We discussed possible etiologies. Tylenol helps, so continue 500 gm 3-4 times per day as needed. New referral placed. Associated visual disturbances, recommend arranging an eye exam.  Clearly instructed about warning signs. Excuse note for work provided.  -     Ambulatory referral to Neurology  Facial paresthesia Perioral associated with episodes of headache, no focal weakness. Episode of left-sided weakness while she was been evaluated in the ED, per pt report, that resolved. ? TIA, complex migraine among some to consider..  Neuro referral placed.  -     Ambulatory referral to Neurology  Atherosclerosis of aorta [facility]) Assessment & Plan: Seen on abdominal/chest CTA on 09/17/22. Rosuvastatin 10 mg daily recommended.  Orders: -     Rosuvastatin Calcium; Take 1 tablet (10 mg total) by mouth daily.  Dispense: 90 tablet; Refill: 1  Hyperlipidemia, unspecified hyperlipidemia type Assessment & Plan: Last LDL 152. Rosuvastatin 10  mg daily recommended. She can hold on starting medication until current acute health issues resolved or  stabilized. Continue low fat diet.  Orders: -     Rosuvastatin Calcium; Take 1 tablet (10 mg total) by mouth daily.  Dispense: 90 tablet; Refill: 1   Return if symptoms worsen or fail to improve, for keep next appointment.  I, Rolla Etienne Wierda, acting as a scribe for Sheyenne Konz [place], MD., have documented all relevant documentation on the behalf of Amarrion Pastorino [place], MD, as directed by  Lenoard Helbert [place], MD while in the presence of Emilie Carp [place], MD.   I, Riel Hirschman [place], MD, have reviewed all documentation for this visit. The documentation on 02/23/23 for the exam, diagnosis, procedures, and orders are all accurate and complete.  Hiroshi Krummel G. [place], MD  [facility]. [place] office.

## 2023-02-23 NOTE — Telephone Encounter (Signed)
  Chief Complaint: headache Symptoms: headache, blurry vision Frequency: 3 days Pertinent Negatives: Patient denies slurred speech, difficulty ambulating Disposition: [] ED /[] Urgent Care (no appt availability in office) / [x] Appointment(In office/virtual)/ []  [facility]/ [] Home Care/ [x] Refused Recommended Disposition /[] [facility]/ []  Follow-up with PCP Additional Notes: Patient called reporting headache, numbness around lips and forehead, blurry vision x 3 days. Patient reports she was evaluated in the ED on  .02/21/23 for these symptoms and diagnosed with a migraine and given consult to neuro.  Reports she was hypertensive in the ED, but BP has been WNL at home.  Per protocol, patient to be evaluated within 4 hours.  Patient reports symptoms are still present as they were in the ED and she has a follow up tomorrow, but wanted to see if she could get in today. Advised there are appointments with other providers today, but not PCP. Care advice reviewed with patient, understanding verbalized. Denies further questions at this time. Alerting PCP for review and follow up.    Copied from CRM [id number]. Topic: Clinical - Red Word Triage >> Feb 23, 2023  8:17 AM Desma Mcgregor wrote: Red Word that prompted transfer to Nurse Triage: Blurred vision, weakness, dizziness, sharp headache. Was discharged from ER Sunday night and symptoms have not gone away. Daughter Jonna Clark on the line. Reason for Disposition  [1] SEVERE headache (e.g., excruciating) AND [2] not improved after 2 hours of pain medicine  Answer Assessment - Initial Assessment Questions 1. LOCATION: "Where does it hurt?"      Headache- forehead, numbness in face and lips with headache 2. ONSET: "When did the headache start?" (Minutes, hours or days)      Monday evening began worsening again, initially began Sunday around noon. 3. PATTERN: "Does the pain come and go, or has it been constant since it started?"     Constant since  returning 4. SEVERITY: "How bad is the pain?" and "What does it keep you from doing?"  (e.g., Scale 1-10; mild, moderate, or severe)   - MILD (1-3): doesn't interfere with normal activities    - MODERATE (4-7): interferes with normal activities or awakens from sleep    - SEVERE (8-10): excruciating pain, unable to do any normal activities        8/10 5. RECURRENT SYMPTOM: "Have you ever had headaches before?" If Yes, ask: "When was the last time?" and "What happened that time?"      Yes, 3 days ago.  6. CAUSE: "What do you think is causing the headache?"     Feels like a migraine 7. MIGRAINE: "Have you been diagnosed with migraine headaches?" If Yes, ask: "Is this headache similar?"      This is the first time. 8. HEAD INJURY: "Has there been any recent injury to the head?"      Denies 9. OTHER SYMPTOMS: "Do you have any other symptoms?" (fever, stiff neck, eye pain, sore throat, cold symptoms)     Blurred vision  Protocols used: Headache-A-AH

## 2023-02-23 NOTE — Patient Instructions (Addendum)
Medication Instructions:  Your physician recommends that you continue on your current medications as directed. Please refer to the Current Medication list given to you today.   *If you need a refill on your cardiac medications before your next appointment, please call your pharmacy*   Lab Work: None    If you have labs (blood work) drawn today and your tests are completely normal, you will receive your results only by: MyChart Message (if you have MyChart) OR A paper copy in the mail If you have any lab test that is abnormal or we need to change your treatment, we will call you to review the results.   Testing/Procedures: None    Follow-Up: At [facility], you and your health needs are our priority.  As part of our continuing mission to provide you with exceptional heart care, we have created designated Provider Care Teams.  These Care Teams include your primary Cardiologist (physician) and Advanced Practice Providers (APPs -  Physician Assistants and Nurse Practitioners) who all work together to provide you with the care you need, when you need it.  We recommend signing up for the patient portal called "MyChart".  Sign up information is provided on this After Visit Summary.  MyChart is used to connect with patients for Virtual Visits (Telemedicine).  Patients are able to view lab/test results, encounter notes, upcoming appointments, etc.  Non-urgent messages can be sent to your provider as well.   To learn more about what you can do with MyChart, go to [email or web address].    Your next appointment:   Follow up as needed    Provider:   Parke Poisson, MD    Other Instructions

## 2023-02-23 NOTE — Progress Notes (Signed)
Cardiology Office Note:  .   Date:  02/23/2023  ID:  Cheyenne Hancock, DOB 30-Nov-1960, MRN [id number] PCP: [place], Betty G, MD  [facility] Providers Cardiologist:  Parke Poisson, MD    History of Present Illness: .   Cheyenne Hancock is a 63 y.o. female.  Discussed the use of AI scribe software for clinical note transcription with the patient, who gave verbal consent to proceed.  History of Present Illness   The patient, with a history of pulmonary embolism in August 2024 with evidence of pulmonary infarct, continuing on [facility] for 1 year per pulmonary medicine, atherosclerosis, hypertension, GERD, hypothyroidism, osteoarthritis, obesity, and hyperlipidemia, presents for a cardiology consultation. The patient's pulmonary embolism, diagnosed in August, was treated with Eliquis, which she is still taking. The patient reports feeling better since the treatment. Echocardiogram is stable and RV size and function personally reviewed and looks normal.   The patient also reports experiencing migraines with stroke-like symptoms. The patient had a recent ER visit due to these symptoms, where a CT and MRI were performed. The MRI showed findings of chronic small vessel ischemia, but no evidence of acute stroke or old stroke.  The patient has not started the statin yet and expresses concerns about potential side effects. The patient's cholesterol was last checked in February of the previous year, and the patient believes her cholesterol could be better now due to changes in diet and lifestyle. She will have that checked with her primary doctor, Dr. [place] tomorrow.   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 However, the patient does not report any chest discomfort, pressure, pain, or tightness. The patient also reports occasional fluid or swelling in her ankles.        ROS: negative except per HPI  above.  Studies Reviewed: .        Results   RADIOLOGY CT Chest: Acute PE in the right main pulmonary artery with concern of evolving pulmonary infarct (09/2022) CT Chest: Aortic atherosclerosis with mild plaque and calcification (09/2022), no significant cor cal MRI Brain: Findings of chronic small vessel ischemia (02/21/2023)  DIAGNOSTIC Echocardiogram: Grossly normal function and normal diastolic parameters (09/2022) Repeat Echocardiogram: personally reviewed - normal diastolic function, erroneously interpreted as Grade II ddx.     Risk Assessment/Calculations:             Physical Exam:   VS:  BP 120/64 (BP Location: Left Arm, Patient Position: Sitting, Cuff Size: Normal)   Pulse 67   Ht 5\' 2"  (1.575 m)   Wt 198 lb (89.8 kg)   SpO2 99%   BMI 36.21 kg/m    Wt Readings from Last 3 Encounters:  02/23/23 198 lb (89.8 kg)  02/23/23 198 lb (89.8 kg)  02/21/23 200 lb 2.8 oz (90.8 kg)     Physical Exam   CHEST: Lungs clear to auscultation. CARDIOVASCULAR: Heart sounds normal. EXTREMITIES: No edema present in ankles.     GEN: Well nourished, well developed in no acute distress NECK: No JVD; No carotid bruits CARDIAC: RRR, no murmurs, rubs, gallops RESPIRATORY:  Clear to auscultation without rales, wheezing or rhonchi  ABDOMEN: Soft, non-tender, non-distended EXTREMITIES:  No edema; No deformity   ASSESSMENT AND PLAN: .    Assessment & Plan Acute pulmonary embolism without acute cor pulmonale, unspecified pulmonary embolism type (HCC)  Hypertension, essential, benign  Atherosclerosis of aorta (HCC)  Hyperlipidemia, unspecified hyperlipidemia type  DOE (dyspnea on exertion)   Assessment and Plan    Pulmonary Embolism History of PE in August with subsequent improvement. Currently on Eliquis 5mg  BID with plan to continue until August. -Continue Eliquis 5mg  BID.  Hyperlipidemia Elevated cholesterol LDL  152, with evidence of atherosclerosis on imaging. Discussed  the benefits of statin therapy in reducing cholesterol and stabilizing plaques. Patient to discuss with primary care provider. -Consider starting statin therapy after discussion with primary care provider. - Recommend starting with atorvastatin 10 mg daily with goal LDL <70.   Migraines Recent episode of stroke-like symptoms, MRI showed no evidence of stroke but findings of chronic small vessel ischemia. Patient to follow up with neurology. -Plans to visit with neurology for further evaluation and management.  Diastolic Dysfunction Echocardiogram reported diastolic dysfunction, however, upon personal review, diastolic function appears normal. No sx of heart failure. Medial e' > 9 cm/s, suggesting normal function.  -No action required. No further echo required at this time.   Atherosclerosis Evidence of aortic atherosclerosis on imaging. Discussed the benefits of statin therapy in reducing cholesterol and stabilizing plaques. Patient to discuss with primary care provider. -Consider starting statin therapy after discussion with primary care provider.  Shortness of Breath Occasional shortness of breath with physical activity. No evidence of cardiac cause at this time, however will have a low threshold for stress testing, and I am happy to coordinate if needed. -Monitor symptoms and consider stress test if symptoms worsen.  Follow-up F/u PRN per patient preference.

## 2023-02-23 NOTE — Assessment & Plan Note (Signed)
Last LDL 152. Rosuvastatin 10 mg daily recommended. She can hold on starting medication until current acute health issues resolved or stabilized. Continue low fat diet.

## 2023-02-23 NOTE — Telephone Encounter (Signed)
Tried to reach out to patient but she is currently at her cardiology appt. Will see what cardiology recommends, keep appt for tomorrow. Will keep an eye out for any cancellations to get pt in sooner.

## 2023-02-24 ENCOUNTER — Encounter: Payer: Self-pay | Admitting: Internal Medicine

## 2023-02-24 ENCOUNTER — Telehealth: Payer: Self-pay | Admitting: Internal Medicine

## 2023-02-24 ENCOUNTER — Ambulatory Visit: Payer: Federal, State, Local not specified - PPO | Admitting: Family Medicine

## 2023-02-24 NOTE — Telephone Encounter (Signed)
PT writes on [place] message:  Hi there,   I run out of Eloquis on this coming Friday and the pharmacy is requesting you send a new prescription as soon as possible. Thank you.   NEW Pharm ALERT is: [facility] in [place].

## 2023-02-25 ENCOUNTER — Telehealth: Payer: Self-pay | Admitting: Internal Medicine

## 2023-02-25 ENCOUNTER — Telehealth: Payer: Self-pay | Admitting: Family Medicine

## 2023-02-25 DIAGNOSIS — I2609 Other pulmonary embolism with acute cor pulmonale: Secondary | ICD-10-CM

## 2023-02-25 NOTE — Telephone Encounter (Signed)
Last 2 mychart messages combined and sent to Dr. Marchelle Gearing.

## 2023-02-25 NOTE — Telephone Encounter (Signed)
Pt daughter Jonna Clark was called  and her mother has an appt to see dr Marjory Lies on Tuesday 03-02-2023 and need a referral

## 2023-02-25 NOTE — Telephone Encounter (Signed)
PT reaches out on [facility] for Eliquis refill. Last seen 01/11/23. Did not mention the Pharm.

## 2023-02-25 NOTE — Telephone Encounter (Signed)
PT's daughter left this [facility] message. They usually take too long to get to Dr. So I am cutting and pasting herein for it to be seen.  Also, Triage, please see other open encounter with RX request and new pharmacy.     Hi this is Tykeisha's daughter.    She was admitted to the ER Sunday with stroke like symptoms, confusion, face numbness, seeing spots, dizziness, imbalance and weakness on the whole left side of her body. The symptoms have continued all week.   We are in the process of setting up a Neurology appointment, but I also spoke to a brain injury specialist that mentioned the symptoms sound like a subdermal hematoma, which is a side effect of Eliquis.   Please share any feedback that may help.  Attachments  [id number]

## 2023-02-25 NOTE — Telephone Encounter (Signed)
Good afternoon, Please review patient's daughter's last 2 mychart messages regarding the refill for Eliquis and her recent ER visit and mention of subdural hematoma and advise.  Thank you.

## 2023-02-25 NOTE — Telephone Encounter (Signed)
PT's daughter calling again. I adv her Dr. Marchelle Gearing needs to see this message first to advise her. I stated at that point Dr. or his nurse will call her. In trying to keep her being patient I  told her usually at days end this is done. Then daughter wanted to make a virtual visit for tomorrow. (No appts avail.) Then daughter wanted to come in to "speed up the process" Adv, that would not help.  Her concern mainly is if the mom needs to remain on Eliquis she only has one does left and needs it called in. They do not want to go into the weekend w/o it and with the ER visit Sunday could it have been caused by this. Marland Kitchen

## 2023-02-26 ENCOUNTER — Encounter: Payer: Self-pay | Admitting: Internal Medicine

## 2023-02-26 ENCOUNTER — Telehealth: Payer: Self-pay | Admitting: Internal Medicine

## 2023-02-26 ENCOUNTER — Other Ambulatory Visit: Payer: Self-pay

## 2023-02-26 DIAGNOSIS — R519 Headache, unspecified: Secondary | ICD-10-CM

## 2023-02-26 DIAGNOSIS — R202 Paresthesia of skin: Secondary | ICD-10-CM

## 2023-02-26 MED ORDER — APIXABAN 5 MG PO TABS: 5.0000 mg | ORAL_TABLET | Freq: Two times a day (BID) | ORAL | 5 refills | Status: DC

## 2023-02-26 NOTE — Telephone Encounter (Signed)
I have refilled the Eliquis  I called the pt and left her a detailed msg letting her know that the med was refilled  I advised we have routed her mychart msgs to MR and are still waiting to hear back, and will inform her once we have

## 2023-02-26 NOTE — Telephone Encounter (Signed)
New referral placed with note that pt has appt already.

## 2023-02-26 NOTE — Telephone Encounter (Signed)
PT sent a [facility] Message today as follows:   Hi Tannessa,   Is there a virtual appointment available sooner to discuss the side effects of the medication?   I had stroke like symptoms all week and an ER visit.  Please see if Dr.can do a MYCHART visit sooner.

## 2023-02-26 NOTE — Telephone Encounter (Signed)
I ordered the labs- D dimer has to be STAT to [facility]  Routing back to MR for tracking

## 2023-02-26 NOTE — Telephone Encounter (Signed)
   Called patient and she is worried if the headaches and neuro symptoms are from elqiuis . There is no eviddence of bleed  Indicated to patient that best way to hold eliquis for minimim 1 week and reasess but there is caveat that PE can come back but is a low risk but is a risk they will have to acccept and she and daughter were aligned with it  Plan  - hold eliquids for 1 week - check d-dimer in a few days - I put order pls double check if correct - then she should call 03/04/23 and inform how she Is doing -. Then decide about eliquis v another DOAC   - send message back to me please for tracking   Latest Reference Range & Units 11/14/18 15:22 09/18/22 08:57 09/20/22 03:56 10/12/22 08:55  D-Dimer, Quant <0.50 mcg/mL FEU 1.09 (H) 2.22 (H) 2.23 (H) 0.44  (H): Data is abnormally high

## 2023-02-26 NOTE — Telephone Encounter (Signed)
Is not stat. It is for monitoring for risk for recurrence. Routine is fine when they come nxt week

## 2023-03-02 ENCOUNTER — Ambulatory Visit: Payer: Federal, State, Local not specified - PPO | Admitting: Diagnostic Neuroimaging

## 2023-03-02 ENCOUNTER — Encounter: Payer: Self-pay | Admitting: Diagnostic Neuroimaging

## 2023-03-02 VITALS — BP 118/61 | HR 69 | Ht 62.0 in | Wt 197.2 lb

## 2023-03-02 DIAGNOSIS — G43109 Migraine with aura, not intractable, without status migrainosus: Secondary | ICD-10-CM

## 2023-03-02 DIAGNOSIS — G459 Transient cerebral ischemic attack, unspecified: Secondary | ICD-10-CM

## 2023-03-02 NOTE — Progress Notes (Signed)
[facility]  PATIENT: Cheyenne Hancock DOB: 25-Jan-1961  REFERRING CLINICIAN: Arby Barrette, MD HISTORY FROM: patient  REASON FOR VISIT: new consult   HISTORICAL  CHIEF COMPLAINT:  Chief Complaint  Patient presents with   New Patient (Initial Visit)    Pt in 7 with daughter  Pt states numbness in left side of face arm and leg. Pt states severe headaches on left side of head Daughter states pt had some brain fog Pt states was taking percocet when brain fog occurred     HISTORY OF PRESENT ILLNESS:   63 year old female here for evaluation of left-sided numbness.  02/21/2023 patient was at church when all of a sudden she had numbness in her lips and left face radiating down to left arm and left leg.  In fact patient initially says numbness but then later says this was more of a weakness sensation in the arm and leg, with numbness mainly in the lips.  Left leg was somewhat hip.  She was having difficulty standing.  Went to the hospital for evaluation.  On the way her blood pressure was noted to be over 200-230 systolic.  Had evaluation and testing.  Since that time she had a few more episodes on the 20th, 21st and 22 January.  This time she was having some spots in her vision with pounding and throbbing headaches.  No definite prior history of migraines.  She does recall some nonspecific headaches in the past.   REVIEW OF SYSTEMS: Full 14 system review of systems performed and negative with exception of: as per HPI.  ALLERGIES: No Known Allergies  HOME MEDICATIONS: Outpatient Medications Prior to Visit  Medication Sig Dispense Refill   apixaban (ELIQUIS) 5 MG TABS tablet Take 1 tablet (5 mg total) by mouth 2 (two) times daily. 60 tablet 5   oxyCODONE-acetaminophen (PERCOCET/ROXICET) 5-325 MG tablet Take 1 tablet by mouth every 6 (six) hours as needed for severe pain (pain score 7-10). 15 tablet 0   rosuvastatin (CRESTOR) 10 MG tablet Take 1 tablet (10 mg  total) by mouth daily. (Patient not taking: Reported on 03/02/2023) 90 tablet 1   No facility-administered medications prior to visit.    PAST MEDICAL HISTORY: Past Medical History:  Diagnosis Date   Chicken pox    History of pulmonary embolus (PE) 09/2022   Hypertension    Dx 2009-2010   Kidney stones    Osteopenia    DEXA 11/22/08   Varicose veins of bilateral lower extremities with pain     PAST SURGICAL HISTORY: Past Surgical History:  Procedure Laterality Date   TUBAL LIGATION  11/24/1984    FAMILY HISTORY: Family History  Problem Relation Age of Onset   Arthritis Mother    Stroke Mother    Hypertension Mother    Cancer Sister        breast   Breast cancer Sister    Cancer Paternal Uncle        lung and prostate   Breast cancer Maternal Grandmother    Stomach cancer Maternal Aunt    Colon cancer Neg Hx    Esophageal cancer Neg Hx    Liver cancer Neg Hx    Pancreatic cancer Neg Hx    Rectal cancer Neg Hx     SOCIAL HISTORY: Social History   Socioeconomic History   Marital status: Divorced    Spouse name: Not on file   Number of children: 2   Years of education: Not on file  Highest education level: Not on file  Occupational History   Not on file  Tobacco Use   Smoking status: Never    Passive exposure: Past   Smokeless tobacco: Never  Vaping Use   Vaping status: Never Used  Substance and Sexual Activity   Alcohol use: No   Drug use: No   Sexual activity: Not Currently    Birth control/protection: Post-menopausal  Other Topics Concern   Not on file  Social History Narrative   Pt lives alone    Pt works    Social Drivers of Corporate investment banker Strain: Patient Declined (06/12/2022)   Overall Financial Resource Strain (CARDIA)    Difficulty of Paying Living Expenses: Patient declined  Food Insecurity: No Food Insecurity (09/22/2022)   Hunger Vital Sign    Worried About Running Out of Food in the Last Year: Never true    Ran Out of  Food in the Last Year: Never true  Transportation Needs: No Transportation Needs (09/22/2022)   PRAPARE - Administrator, Civil Service (Medical): No    Lack of Transportation (Non-Medical): No  Physical Activity: Sufficiently Active (06/12/2022)   Exercise Vital Sign    Days of Exercise per Week: 5 days    Minutes of Exercise per Session: 150+ min  Stress: No Stress Concern Present (06/12/2022)   [employer] of Occupational Health - Occupational Stress Questionnaire    Feeling of Stress : Not at all  Social Connections: Moderately Integrated (06/12/2022)   Social Connection and Isolation Panel [NHANES]    Frequency of Communication with Friends and Family: More than three times a week    Frequency of Social Gatherings with Friends and Family: Once a week    Attends Religious Services: More than 4 times per year    Active Member of [employer] or Organizations: Yes    Attends Banker Meetings: More than 4 times per year    Marital Status: Divorced  Intimate Partner Violence: Unknown (09/18/2022)   Humiliation, Afraid, Rape, and Kick questionnaire    Fear of Current or Ex-Partner: No    Emotionally Abused: Patient unable to answer    Physically Abused: Patient unable to answer    Sexually Abused: Patient unable to answer     PHYSICAL EXAM  GENERAL EXAM/CONSTITUTIONAL: Vitals:  Vitals:   03/02/23 0912  BP: 118/61  Pulse: 69  Weight: 197 lb 3.2 oz (89.4 kg)  Height: 5\' 2"  (1.575 m)   Body mass index is 36.07 kg/m. Wt Readings from Last 3 Encounters:  03/02/23 197 lb 3.2 oz (89.4 kg)  02/23/23 198 lb (89.8 kg)  02/23/23 198 lb (89.8 kg)   Patient is in no distress; well developed, nourished and groomed; neck is supple  CARDIOVASCULAR: Examination of carotid arteries is normal; no carotid bruits Regular rate and rhythm, no murmurs Examination of peripheral vascular system by observation and palpation is normal  EYES: Ophthalmoscopic exam of optic  discs and posterior segments is normal; no papilledema or hemorrhages No results found.  MUSCULOSKELETAL: Gait, strength, tone, movements noted in Neurologic exam below  NEUROLOGIC: MENTAL STATUS:      No data to display         awake, alert, oriented to person, place and time recent and remote memory intact normal attention and concentration language fluent, comprehension intact, naming intact fund of knowledge appropriate  CRANIAL NERVE:  2nd - no papilledema on fundoscopic exam 2nd, 3rd, 4th, 6th - pupils equal and reactive  to light, visual fields full to confrontation, extraocular muscles intact, no nystagmus 5th - facial sensation symmetric 7th - facial strength symmetric 8th - hearing intact 9th - palate elevates symmetrically, uvula midline 11th - shoulder shrug symmetric 12th - tongue protrusion midline  MOTOR:  normal bulk and tone, full strength in the BUE, BLE  SENSORY:  normal and symmetric to light touch, temperature, vibration  COORDINATION:  finger-nose-finger, fine finger movements normal  REFLEXES:  deep tendon reflexes TRACE and symmetric  GAIT/STATION:  narrow based gait     DIAGNOSTIC DATA (LABS, IMAGING, TESTING) - I reviewed patient records, labs, notes, testing and imaging myself where available.  Lab Results  Component Value Date   WBC 5.5 02/21/2023   HGB 12.9 02/21/2023   HCT 38.0 02/21/2023   MCV 90.0 02/21/2023   PLT 295 02/21/2023      Component Value Date/Time   NA 139 02/21/2023 1555   K 3.9 02/21/2023 1555   CL 105 02/21/2023 1555   CO2 27 02/21/2023 1544   GLUCOSE 95 02/21/2023 1555   BUN 9 02/21/2023 1555   CREATININE 0.90 02/21/2023 1555   CALCIUM 9.7 02/21/2023 1544   PROT 7.5 02/21/2023 1544   ALBUMIN 4.0 02/21/2023 1544   AST 19 02/21/2023 1544   ALT 15 02/21/2023 1544   ALKPHOS 65 02/21/2023 1544   BILITOT 0.9 02/21/2023 1544   GFRNONAA >60 02/21/2023 1544   GFRAA >60 11/14/2018 1010   Lab Results   Component Value Date   CHOL 225 (H) 03/24/2022   HDL 57.80 03/24/2022   LDLCALC 152 (H) 03/24/2022   TRIG 76.0 03/24/2022   CHOLHDL 4 03/24/2022   Lab Results  Component Value Date   HGBA1C 5.8 03/24/2022   Lab Results  Component Value Date   VITAMINB12 >1526 (H) 01/08/2020   Lab Results  Component Value Date   TSH 3.44 04/01/2020    CTA head / neck [I reviewed images myself and agree with interpretation. Calcification and shallow plaque at right ICA origin, without measurable stenosis. -VRP]  1. No intracranial large vessel occlusion. 2. Mild atherosclerotic change at the right ICA bulb but without stenosis. 3. Left PCA takes fetal origin from the anterior circulation. There is some narrowing and irregularity within the PCA branches, worse on the right than the left.   ASSESSMENT AND PLAN  63 y.o. year old female here with:   Dx:  1. TIA (transient ischemic attack)   2. Complicated migraine     PLAN:  TIA vs complicated migraine (transient left face, arm, leg weakness; left face numbness) - continue eliquis (currently for pulmonary embolism); if this is stopped, then start aspirin 81mg  daily - check cardiac monitor (rule out afib) - recommend carotid u/s testing in 1 year - start rosuvastatin (patient will start tomorrow) - monitor BP at home  MIGRAINE WITH AURA - monitor; if returns, may consider migraine medication (avoid triptan due to TIA history)  Orders Placed This Encounter  Procedures   Cardiac event monitor   Return for pending if symptoms worsen or fail to improve, pending test results.    Suanne Marker, MD 03/02/2023, 10:23 AM Certified in Neurology, Neurophysiology and Neuroimaging  [facility] [address], [address] [place], [place] [address] [phone]

## 2023-03-02 NOTE — Telephone Encounter (Signed)
Eliquis refilled on 02/26/23

## 2023-03-02 NOTE — Patient Instructions (Addendum)
TIA vs complicated migraine (transient left face, arm, leg weakness; left face numbness) - continue eliquis (currently for pulmonary embolism); if this is stopped, then start aspirin 81mg  daily - check cardiac monitor (rule out afib) - recommend carotid u/s testing in 1 year - start rosuvastatin (patient will start tomorrow) - monitor BP at home  MIGRAINE WITH AURA - monitor; if returns, may consider migraine medication (avoid triptan due to TIA history)

## 2023-03-03 NOTE — Telephone Encounter (Signed)
See encounter 02/25/23

## 2023-03-04 NOTE — Telephone Encounter (Signed)
Well  1) is the eliquis on hold? And if so after stopping eliquis did yo ucome and do d-dimer blood work this week? And are the headaches less or gone without eliquis?

## 2023-03-11 NOTE — Telephone Encounter (Signed)
 [facility]N

## 2023-03-12 ENCOUNTER — Ambulatory Visit: Payer: Self-pay | Admitting: Family Medicine

## 2023-03-12 NOTE — Telephone Encounter (Signed)
 Copied from CRM [id number]. Topic: Clinical - Red Word Triage >> Mar 12, 2023  3:22 PM Russell PARAS wrote: Red Word that prompted transfer to Nurse Triage:   Left leg swelling intermittently for the past couple of days.  Thigh, knee and calf are swollen.  No pain, doesnt affect mobility Elevation does not help  Left arm is swollen, with some mild pain in elbow. No fever.  Chief Complaint: swelling Symptoms: Left leg swelling from thigh to calf area and left arm swelling from shoulder to wrist, pain only in left elbow that radiates to left shoulder 3/10 at rest and 6/10 with movement, some SOB w.exertion but patient states this normal Frequency: started Wednesday Pertinent Negatives: Patient denies fever, no pain in calf, redness, streaking Disposition: [x] ED /[] Urgent Care (no appt availability in office) / [] Appointment(In office/virtual)/ []  [place] [facility]/ [] Home Care/ [] Refused Recommended Disposition /[] [place] [facility]/ []  Follow-up with PCP Additional Notes: had this swelling on left arm and leg about 1 month ago- it stayed for a week then went away. Left leg below knee is a big knot (wraps to back of leg/calf area) pt states looks like fluid filled sack. SOB w/ excertion is normal since 09/2022 with blood clot:  has not increased, but is still occurring. Pt has been elevating leg but elevation does not help swelling decrease. Pt recently went to ER for stroke like s/s that effected her left side: pt stated she was told no stroke & cause was due to margarine.   Reason for Disposition  [1] Difficulty breathing with exertion (e.g., walking) AND [2] new-onset or worsening  Answer Assessment - Initial Assessment Questions 1. ONSET: When did the swelling start? (e.g., minutes, hours, days)     Wednesday 2. LOCATION: What part of the leg is swollen?  Are both legs swollen or just one leg?     Left leg from thigh to calf area and left arm from shoulder to wrist  3. SEVERITY:  How bad is the swelling? (e.g., localized; mild, moderate, severe)   - Localized: Small area of swelling localized to one leg.   - MILD pedal edema: Swelling limited to foot and ankle, pitting edema < 1/4 inch (6 mm) deep, rest and elevation eliminate most or all swelling.   - MODERATE edema: Swelling of lower leg to knee, pitting edema > 1/4 inch (6 mm) deep, rest and elevation only partially reduce swelling.   - SEVERE edema: Swelling extends above knee, facial or hand swelling present.      Mild - non pitting edema 4. REDNESS: Does the swelling look red or infected?     no 5. PAIN: Is the swelling painful to touch? If Yes, ask: How painful is it?   (Scale 1-10; mild, moderate or severe)     Swelling does not cause pain  6. FEVER: Do you have a fever? If Yes, ask: What is it, how was it measured, and when did it start?      no 7. CAUSE: What do you think is causing the leg swelling?     N/a 8. MEDICAL HISTORY: Do you have a history of blood clots (e.g., DVT), cancer, heart failure, kidney disease, or liver failure?     Blood clot - takes eliquis  August 2024 9. RECURRENT SYMPTOM: Have you had leg swelling before? If Yes, ask: When was the last time? What happened that time?     Yes about 1 month ago 10. OTHER SYMPTOMS: Do you have  any other symptoms? (e.g., chest pain, difficulty breathing)       Left elbow pain 6/10 with movement and lifting and when lifting pain radiates to shoulder - no movement 3/10 11. PREGNANCY: Is there any chance you are pregnant? When was your last menstrual period?       N/a  Protocols used: Leg Swelling and Edema-A-AH

## 2023-03-13 ENCOUNTER — Other Ambulatory Visit: Payer: Self-pay

## 2023-03-13 ENCOUNTER — Emergency Department (HOSPITAL_COMMUNITY): Payer: Federal, State, Local not specified - PPO

## 2023-03-13 ENCOUNTER — Encounter (HOSPITAL_COMMUNITY): Payer: Self-pay

## 2023-03-13 ENCOUNTER — Emergency Department (HOSPITAL_COMMUNITY)
Admission: EM | Admit: 2023-03-13 | Discharge: 2023-03-13 | Disposition: A | Discharge status: Home / Self Care | Payer: Federal, State, Local not specified - PPO | Attending: Emergency Medicine | Admitting: Emergency Medicine

## 2023-03-13 ENCOUNTER — Emergency Department (HOSPITAL_BASED_OUTPATIENT_CLINIC_OR_DEPARTMENT_OTHER): Payer: Federal, State, Local not specified - PPO

## 2023-03-13 DIAGNOSIS — M7989 Other specified soft tissue disorders: Secondary | ICD-10-CM | POA: Diagnosis not present

## 2023-03-13 DIAGNOSIS — Z7901 Long term (current) use of anticoagulants: Secondary | ICD-10-CM | POA: Diagnosis not present

## 2023-03-13 DIAGNOSIS — R2242 Localized swelling, mass and lump, left lower limb: Secondary | ICD-10-CM | POA: Diagnosis not present

## 2023-03-13 DIAGNOSIS — M1712 Unilateral primary osteoarthritis, left knee: Secondary | ICD-10-CM | POA: Diagnosis not present

## 2023-03-13 LAB — BASIC METABOLIC PANEL
Anion gap: 8 (ref 5–15)
BUN: 8 mg/dL (ref 8–23)
CO2: 24 mmol/L (ref 22–32)
Calcium: 9.2 mg/dL (ref 8.9–10.3)
Chloride: 105 mmol/L (ref 98–111)
Creatinine, Ser: 0.98 mg/dL (ref 0.44–1.00)
GFR, Estimated: >60 mL/min (ref >60)
Glucose, Bld: 109 mg/dL — ABNORMAL HIGH (ref 70–99)
Potassium: 3.7 mmol/L (ref 3.5–5.1)
Sodium: 137 mmol/L (ref 135–145)

## 2023-03-13 LAB — CBC WITH DIFFERENTIAL/PLATELET
Abs Immature Granulocytes: 0.02 10*3/uL (ref 0.00–0.07)
Basophils Absolute: 0 10*3/uL (ref 0.0–0.1)
Basophils Relative: 1 %
Eosinophils Absolute: 0.1 10*3/uL (ref 0.0–0.5)
Eosinophils Relative: 2 %
HCT: 35.7 % — ABNORMAL LOW (ref 36.0–46.0)
Hemoglobin: 11.9 g/dL — ABNORMAL LOW (ref 12.0–15.0)
Immature Granulocytes: 1 %
Lymphocytes Relative: 38 %
Lymphs Abs: 1.6 10*3/uL (ref 0.7–4.0)
MCH: 30 pg (ref 26.0–34.0)
MCHC: 33.3 g/dL (ref 30.0–36.0)
MCV: 89.9 fL (ref 80.0–100.0)
Monocytes Absolute: 0.3 10*3/uL (ref 0.1–1.0)
Monocytes Relative: 7 %
Neutro Abs: 2.2 10*3/uL (ref 1.7–7.7)
Neutrophils Relative %: 51 %
Platelets: 252 10*3/uL (ref 150–400)
RBC: 3.97 MIL/uL (ref 3.87–5.11)
RDW: 13.3 % (ref 11.5–15.5)
WBC: 4.2 10*3/uL (ref 4.0–10.5)
nRBC: 0 % (ref 0.0–0.2)

## 2023-03-13 NOTE — Discharge Instructions (Signed)
 It was our pleasure to provide your ER care today - we hope that you feel better.  Your imaging studies show no blood clot. Note was made of mild arthritis left knee.   Follow up with your doctor in the next couple weeks.   Return to ER if worse, new symptoms, fevers, new/severe pain, severe leg swelling/pain, or other concern.

## 2023-03-13 NOTE — Progress Notes (Signed)
 VASCULAR LAB    Left lower extremity venous duplex has been performed.  See CV proc for preliminary results.  Messaged negative results to Dr. Moses Arenas via secure chat  Carleene Chase, RVT 03/13/2023, 10:23 AM

## 2023-03-13 NOTE — ED Provider Notes (Signed)
 [facility] Provider Note   CSN: [id number] Arrival date & time: 03/13/23  9182     History  Chief Complaint  Patient presents with   Joint Swelling    Kenasia Scheller is a 63 y.o. female.  Pt with recent c/o noticing swelling about left knee and just inferior to knee. Was worse after prolonged standing. ?swelling to left lower leg. Is on eliquis , indicates compliant w rx. No chest pain or discomfort. No sob or unusual doe. No radicular pain. No hip, knee or ankle pain. No skin changes or redness.   The history is provided by the patient and medical records.       Home Medications Prior to Admission medications   Medication Sig Start Date End Date Taking? Authorizing Provider  apixaban  (ELIQUIS ) 5 MG TABS tablet Take 1 tablet (5 mg total) by mouth 2 (two) times daily. 02/26/23   Geronimo Amel, MD  oxyCODONE -acetaminophen  (PERCOCET/ROXICET) 5-325 MG tablet Take 1 tablet by mouth every 6 (six) hours as needed for severe pain (pain score 7-10). 02/21/23   Armenta Canning, MD  rosuvastatin  (CRESTOR ) 10 MG tablet Take 1 tablet (10 mg total) by mouth daily. Patient not taking: Reported on 03/02/2023 02/23/23   Jordan, Betty G, MD      Allergies    Patient has no known allergies.    Review of Systems   Review of Systems  Constitutional:  Negative for chills and fever.  Eyes:  Negative for visual disturbance.  Respiratory:  Negative for shortness of breath.   Cardiovascular:  Negative for chest pain.  Gastrointestinal:  Negative for abdominal pain, nausea and vomiting.  Genitourinary:  Negative for flank pain.  Musculoskeletal:  Negative for back pain.  Skin:  Negative for rash.  Neurological:  Negative for speech difficulty, numbness and headaches.    Physical Exam Updated Vital Signs BP 139/75   Pulse 71   Temp 98.2 F (36.8 C)   Resp 15   Ht 1.575 m (5' 2)   Wt 89.4 kg   SpO2 100%   BMI 36.03 kg/m  Physical  Exam Vitals and nursing note reviewed.  Constitutional:      Appearance: Normal appearance. She is well-developed.  HENT:     Head: Atraumatic.     Nose: Nose normal.     Mouth/Throat:     Mouth: Mucous membranes are moist.  Eyes:     General: No scleral icterus.    Conjunctiva/sclera: Conjunctivae normal.     Pupils: Pupils are equal, round, and reactive to light.  Neck:     Vascular: No carotid bruit.     Trachea: No tracheal deviation.  Cardiovascular:     Rate and Rhythm: Normal rate and regular rhythm.     Pulses: Normal pulses.  Pulmonary:     Effort: Pulmonary effort is normal. No respiratory distress.  Abdominal:     General: There is no distension.     Palpations: Abdomen is soft.     Tenderness: There is no abdominal tenderness.  Musculoskeletal:        General: No swelling.     Cervical back: Normal range of motion and neck supple. No muscular tenderness.     Comments: Good rom left hip, knee and ankle without pain. No gross LLE edema noted. LLE is of normal color and warmth. Distal pulses palp.   Skin:    General: Skin is warm and dry.     Findings: No  rash.  Neurological:     Mental Status: She is alert.     Comments: Alert, speech normal. No dysarthria or aphasia. Motor fxn intact bil, stre 5/5. No pronator drift. Sens grossly intact. Steady gait.   Psychiatric:        Mood and Affect: Mood normal.     ED Results / Procedures / Treatments   Labs (all labs ordered are listed, but only abnormal results are displayed) Results for orders placed or performed during the hospital encounter of 03/13/23  CBC with Differential   Collection Time: 03/13/23  8:37 AM  Result Value Ref Range   WBC 4.2 4.0 - 10.5 K/uL   RBC 3.97 3.87 - 5.11 MIL/uL   Hemoglobin 11.9 (L) 12.0 - 15.0 g/dL   HCT 64.2 (L) 63.9 - 53.9 %   MCV 89.9 80.0 - 100.0 fL   MCH 30.0 26.0 - 34.0 pg   MCHC 33.3 30.0 - 36.0 g/dL   RDW 86.6 88.4 - 84.4 %   Platelets 252 150 - 400 K/uL   nRBC 0.0  0.0 - 0.2 %   Neutrophils Relative % 51 %   Neutro Abs 2.2 1.7 - 7.7 K/uL   Lymphocytes Relative 38 %   Lymphs Abs 1.6 0.7 - 4.0 K/uL   Monocytes Relative 7 %   Monocytes Absolute 0.3 0.1 - 1.0 K/uL   Eosinophils Relative 2 %   Eosinophils Absolute 0.1 0.0 - 0.5 K/uL   Basophils Relative 1 %   Basophils Absolute 0.0 0.0 - 0.1 K/uL   Immature Granulocytes 1 %   Abs Immature Granulocytes 0.02 0.00 - 0.07 K/uL  Basic metabolic panel   Collection Time: 03/13/23  8:37 AM  Result Value Ref Range   Sodium 137 135 - 145 mmol/L   Potassium 3.7 3.5 - 5.1 mmol/L   Chloride 105 98 - 111 mmol/L   CO2 24 22 - 32 mmol/L   Glucose, Bld 109 (H) 70 - 99 mg/dL   BUN 8 8 - 23 mg/dL   Creatinine, Ser 9.01 0.44 - 1.00 mg/dL   Calcium  9.2 8.9 - 10.3 mg/dL   GFR, Estimated >39 >39 mL/min   Anion gap 8 5 - 15   DG Knee Complete 4 Views Left Result Date: 03/13/2023 CLINICAL DATA:  Left knee swelling. EXAM: LEFT KNEE - COMPLETE 4+ VIEW COMPARISON:  Left knee x-rays dated May 05, 2017. FINDINGS: No acute fracture or dislocation. No joint effusion. Mild medial patellofemoral compartment joint space narrowing. Multiple small round dystrophic calcifications overlying the anterior knee. IMPRESSION: 1. Mild medial and patellofemoral compartment osteoarthritis. Electronically Signed   By: Elsie ONEIDA Shoulder M.D.   On: 03/13/2023 11:03   VAS US  LOWER EXTREMITY VENOUS (DVT) (7a-7p) Result Date: 03/13/2023  Lower Venous DVT Study Patient Name:  RAYVN RICKERSON  Date of Exam:   03/13/2023 Medical Rec #: [id number]                  Accession #:    [id number] Date of Birth: May 20, 1960                  Patient Gender: F Patient Age:   73 years Exam Location:  [facility] Procedure:      VAS US  LOWER EXTREMITY VENOUS (DVT) Referring Phys: Zamyia Gowell --------------------------------------------------------------------------------  Indications: Swelling.  Anticoagulation: Eliquis . Comparison Study: Prior negative  bilateral LEV done 09/18/22 Performing Technologist: Alberta Lis RVS  Examination Guidelines: A complete evaluation includes B-mode imaging, spectral  Doppler, color Doppler, and power Doppler as needed of all accessible portions of each vessel. Bilateral testing is considered an integral part of a complete examination. Limited examinations for reoccurring indications may be performed as noted. The reflux portion of the exam is performed with the patient in reverse Trendelenburg.  +-----+---------------+---------+-----------+----------+--------------+ RIGHTCompressibilityPhasicitySpontaneityPropertiesThrombus Aging +-----+---------------+---------+-----------+----------+--------------+ CFV  Full           Yes      Yes                                 +-----+---------------+---------+-----------+----------+--------------+   +---------+---------------+---------+-----------+----------+--------------+ LEFT     CompressibilityPhasicitySpontaneityPropertiesThrombus Aging +---------+---------------+---------+-----------+----------+--------------+ CFV      Full           Yes      Yes                                 +---------+---------------+---------+-----------+----------+--------------+ SFJ      Full                                                        +---------+---------------+---------+-----------+----------+--------------+ FV Prox  Full                                                        +---------+---------------+---------+-----------+----------+--------------+ FV Mid   Full                                                        +---------+---------------+---------+-----------+----------+--------------+ FV DistalFull                                                        +---------+---------------+---------+-----------+----------+--------------+ PFV      Full                                                         +---------+---------------+---------+-----------+----------+--------------+ POP      Full           Yes      Yes                                 +---------+---------------+---------+-----------+----------+--------------+ PTV      Full                                                        +---------+---------------+---------+-----------+----------+--------------+  PERO     Full                                                        +---------+---------------+---------+-----------+----------+--------------+ Gastroc  Full                                                        +---------+---------------+---------+-----------+----------+--------------+    Summary: RIGHT: - No evidence of common femoral vein obstruction.   LEFT: - No evidence of deep vein thrombosis in the lower extremity. No indirect evidence of obstruction proximal to the inguinal ligament.  - No cystic structure found in the popliteal fossa.  *See table(s) above for measurements and observations.    Preliminary    MR BRAIN WO CONTRAST Result Date: 02/21/2023 CLINICAL DATA:  Acute neurologic deficit EXAM: MRI HEAD WITHOUT CONTRAST TECHNIQUE: Multiplanar, multiecho pulse sequences of the brain and surrounding structures were obtained without intravenous contrast. COMPARISON:  None Available. FINDINGS: Brain: No acute infarct, mass effect or extra-axial collection. No acute or chronic hemorrhage. There is multifocal hyperintense T2-weighted signal within the white matter. Parenchymal volume and CSF spaces are normal. The midline structures are normal. Vascular: Normal flow voids. Skull and upper cervical spine: Normal calvarium and skull base. Visualized upper cervical spine and soft tissues are normal. Sinuses/Orbits:No paranasal sinus fluid levels or advanced mucosal thickening. No mastoid or middle ear effusion. Normal orbits. IMPRESSION: 1. No acute intracranial abnormality. 2. Findings of chronic small vessel ischemia.  Electronically Signed   By: Franky Stanford M.D.   On: 02/21/2023 19:38   CT ANGIO HEAD NECK W WO CM Result Date: 02/21/2023 CLINICAL DATA:  Neuro deficit, acute, stroke suspected. Left face, arm and leg numbness. Left leg weakness. EXAM: CT ANGIOGRAPHY HEAD AND NECK WITH AND WITHOUT CONTRAST TECHNIQUE: Multidetector CT imaging of the head and neck was performed using the standard protocol during bolus administration of intravenous contrast. Multiplanar CT image reconstructions and MIPs were obtained to evaluate the vascular anatomy. Carotid stenosis measurements (when applicable) are obtained utilizing NASCET criteria, using the distal internal carotid diameter as the denominator. RADIATION DOSE REDUCTION: This exam was performed according to the departmental dose-optimization program which includes automated exposure control, adjustment of the mA and/or kV according to patient size and/or use of iterative reconstruction technique. CONTRAST:  75mL OMNIPAQUE  IOHEXOL  350 MG/ML SOLN COMPARISON:  Head CT same day FINDINGS: CTA NECK FINDINGS Aortic arch: Minimal atherosclerotic calcification. Branching pattern is normal without origin stenosis. Right carotid system: Common carotid artery widely patent to the bifurcation. Mild calcified plaque in the ICA bulb but no stenosis when compared to the more distal cervical ICA. Left carotid system: Common carotid artery widely patent to the bifurcation. Mild soft plaque in the distal common carotid artery but no stenosis. Carotid bifurcation itself and ICA bulb appear normal an widely patent. Vertebral arteries: Both vertebral artery origins are widely patent. Both vertebral arteries appear normal through the cervical region to the foramen magnum. Skeleton: Normal Other neck: No mass or lymphadenopathy. Upper chest: Normal Review of the MIP images confirms the above findings CTA HEAD FINDINGS Anterior circulation: Both internal carotid arteries are patent through the  skull  base and siphon regions. The anterior and middle cerebral vessels patent. No stenosis or occlusion. No aneurysm or vascular malformation. Posterior circulation: Both vertebral arteries widely patent to the basilar artery. No basilar stenosis. Posterior circulation branch vessels show flow. Left PCA takes fetal origin from the anterior circulation. There is some narrowing and irregularity within the PCA branches, worse on the right than the left. Venous sinuses: Patent and normal. Anatomic variants: None significant. Review of the MIP images confirms the above findings IMPRESSION: 1. No intracranial large vessel occlusion. 2. Mild atherosclerotic change at the right ICA bulb but without stenosis. 3. Left PCA takes fetal origin from the anterior circulation. There is some narrowing and irregularity within the PCA branches, worse on the right than the left. Aortic Atherosclerosis (ICD10-I70.0). Electronically Signed   By: Oneil Officer M.D.   On: 02/21/2023 17:33   CT HEAD WO CONTRAST Result Date: 02/21/2023 CLINICAL DATA:  Headache, left-sided weakness. EXAM: CT HEAD WITHOUT CONTRAST TECHNIQUE: Contiguous axial images were obtained from the base of the skull through the vertex without intravenous contrast. RADIATION DOSE REDUCTION: This exam was performed according to the departmental dose-optimization program which includes automated exposure control, adjustment of the mA and/or kV according to patient size and/or use of iterative reconstruction technique. COMPARISON:  None Available. FINDINGS: Brain: No evidence of acute infarction, hemorrhage, hydrocephalus, extra-axial collection or mass lesion/mass effect. Vascular: No hyperdense vessel or unexpected calcification. Skull: Normal. Negative for fracture or focal lesion. Sinuses/Orbits: No acute finding. Other: None. IMPRESSION: No acute intracranial process. Electronically Signed   By: Norman Hopper M.D.   On: 02/21/2023 16:47    EKG None  Radiology DG  Knee Complete 4 Views Left Result Date: 03/13/2023 CLINICAL DATA:  Left knee swelling. EXAM: LEFT KNEE - COMPLETE 4+ VIEW COMPARISON:  Left knee x-rays dated May 05, 2017. FINDINGS: No acute fracture or dislocation. No joint effusion. Mild medial patellofemoral compartment joint space narrowing. Multiple small round dystrophic calcifications overlying the anterior knee. IMPRESSION: 1. Mild medial and patellofemoral compartment osteoarthritis. Electronically Signed   By: Elsie ONEIDA Shoulder M.D.   On: 03/13/2023 11:03   VAS US  LOWER EXTREMITY VENOUS (DVT) (7a-7p) Result Date: 03/13/2023  Lower Venous DVT Study Patient Name:  ARIAH MOWER  Date of Exam:   03/13/2023 Medical Rec #: [id number]                  Accession #:    [id number] Date of Birth: 1960/10/12                  Patient Gender: F Patient Age:   52 years Exam Location:  [facility] Procedure:      VAS US  LOWER EXTREMITY VENOUS (DVT) Referring Phys: [name] Mcnaught --------------------------------------------------------------------------------  Indications: Swelling.  Anticoagulation: Eliquis . Comparison Study: Prior negative bilateral LEV done 09/18/22 Performing Technologist: Alberta Lis RVS  Examination Guidelines: A complete evaluation includes B-mode imaging, spectral Doppler, color Doppler, and power Doppler as needed of all accessible portions of each vessel. Bilateral testing is considered an integral part of a complete examination. Limited examinations for reoccurring indications may be performed as noted. The reflux portion of the exam is performed with the patient in reverse Trendelenburg.  +-----+---------------+---------+-----------+----------+--------------+ RIGHTCompressibilityPhasicitySpontaneityPropertiesThrombus Aging +-----+---------------+---------+-----------+----------+--------------+ CFV  Full           Yes      Yes                                  +-----+---------------+---------+-----------+----------+--------------+   +---------+---------------+---------+-----------+----------+--------------+  LEFT     CompressibilityPhasicitySpontaneityPropertiesThrombus Aging +---------+---------------+---------+-----------+----------+--------------+ CFV      Full           Yes      Yes                                 +---------+---------------+---------+-----------+----------+--------------+ SFJ      Full                                                        +---------+---------------+---------+-----------+----------+--------------+ FV Prox  Full                                                        +---------+---------------+---------+-----------+----------+--------------+ FV Mid   Full                                                        +---------+---------------+---------+-----------+----------+--------------+ FV DistalFull                                                        +---------+---------------+---------+-----------+----------+--------------+ PFV      Full                                                        +---------+---------------+---------+-----------+----------+--------------+ POP      Full           Yes      Yes                                 +---------+---------------+---------+-----------+----------+--------------+ PTV      Full                                                        +---------+---------------+---------+-----------+----------+--------------+ PERO     Full                                                        +---------+---------------+---------+-----------+----------+--------------+ Gastroc  Full                                                        +---------+---------------+---------+-----------+----------+--------------+  Summary: RIGHT: - No evidence of common femoral vein obstruction.   LEFT: - No evidence of deep vein thrombosis in the lower  extremity. No indirect evidence of obstruction proximal to the inguinal ligament.  - No cystic structure found in the popliteal fossa.  *See table(s) above for measurements and observations.    Preliminary     Procedures Procedures    Medications Ordered in ED Medications - No data to display  ED Course/ Medical Decision Making/ A&P                                 Medical Decision Making Problems Addressed: Left leg swelling: acute illness or injury  Amount and/or Complexity of Data Reviewed External Data Reviewed: notes. Labs: ordered. Decision-making details documented in ED Course. Radiology: ordered and independent interpretation performed. Decision-making details documented in ED Course.   Labs ordered/sent. Imaging ordered.   Differential diagnosis includes DVT, peripheral edema, djd, etc. Dispo decision including potential need for admission considered - will get labs and imaging and reassess.   Reviewed nursing notes and prior charts for additional history. External reports reviewed. Pt with recent extensive eval for left sided neuro c/o, cta and MRI then neg for acute process.   Labs reviewed/interpreted by me - wbc normal. Hct 36. Chem normal.   Xrays reviewed/interpreted by me - no fx. Mild degen changes.   US  reviewed/interpreted by me - no dvt.   Pt currently appears stable for ED d/c.   Rec close pcp f/u.  Return precautions provided.          Final Clinical Impression(s) / ED Diagnoses Final diagnoses:  Left leg swelling    Rx / DC Orders ED Discharge Orders     None         Bernard Drivers, MD 03/13/23 1223

## 2023-03-13 NOTE — ED Triage Notes (Addendum)
 Patient reports on Thursday started having swelling to left leg, left arm.  Reports its getting worse and she feels as though she is dragging her left leg.  Denies numbness tingling or headache.  Significant swelling to left knee and thigh.  Hx of PE on blood thinners and statin.

## 2023-03-18 ENCOUNTER — Telehealth: Payer: Self-pay | Admitting: *Deleted

## 2023-03-18 NOTE — Telephone Encounter (Signed)
I had cancelled the patients cardiac event monitor because patient refused.  I was asked to contact the patient to explain the monitors billing process as she was concerned about the cost.  She also felt the monitor instructions were confusing. I explained the monitor companies were private vendors.  We supply them with the patients insurance information when we enroll them with their company.  They then send the patient a monitor.  The patient would not be bill by the monitor company unless they applied the monitor, turned it on and wore it for 24 hours.  If I enrolled her, she could then call the company for an estimate on her out of pocket costs.  If she agreed to continue with the monitoring process, I could schedule her to come into the office to have the monitor applied.  I would also show her what she would need to do for the remaining 30 days. We also reviewed the purpose of the cardiac event monitor. Patient would like to discuss it with her PCP before making the decision to continue. 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 Etheleen Mayhew.  We discussed enrolling her with the [employer] monitor company because it is slightly easier to use.  Once she received the monitor we would schedule an in office application. Patient given our office number and the monitor department number.

## 2023-03-19 DIAGNOSIS — N281 Cyst of kidney, acquired: Secondary | ICD-10-CM | POA: Diagnosis not present

## 2023-03-19 DIAGNOSIS — S42019A Nondisplaced fracture of sternal end of unspecified clavicle, initial encounter for closed fracture: Secondary | ICD-10-CM | POA: Diagnosis not present

## 2023-03-19 DIAGNOSIS — M50322 Other cervical disc degeneration at C5-C6 level: Secondary | ICD-10-CM | POA: Diagnosis not present

## 2023-03-19 DIAGNOSIS — Z041 Encounter for examination and observation following transport accident: Secondary | ICD-10-CM | POA: Diagnosis not present

## 2023-03-19 DIAGNOSIS — S0990XA Unspecified injury of head, initial encounter: Secondary | ICD-10-CM | POA: Diagnosis not present

## 2023-03-19 DIAGNOSIS — S2220XA Unspecified fracture of sternum, initial encounter for closed fracture: Secondary | ICD-10-CM | POA: Diagnosis not present

## 2023-03-19 DIAGNOSIS — M549 Dorsalgia, unspecified: Secondary | ICD-10-CM | POA: Diagnosis not present

## 2023-03-19 DIAGNOSIS — M545 Low back pain, unspecified: Secondary | ICD-10-CM | POA: Diagnosis not present

## 2023-03-19 DIAGNOSIS — D734 Cyst of spleen: Secondary | ICD-10-CM | POA: Diagnosis not present

## 2023-03-19 DIAGNOSIS — J9811 Atelectasis: Secondary | ICD-10-CM | POA: Diagnosis not present

## 2023-03-19 DIAGNOSIS — R079 Chest pain, unspecified: Secondary | ICD-10-CM | POA: Diagnosis not present

## 2023-03-19 DIAGNOSIS — I10 Essential (primary) hypertension: Secondary | ICD-10-CM | POA: Diagnosis not present

## 2023-03-23 ENCOUNTER — Encounter: Payer: Self-pay | Admitting: Family Medicine

## 2023-03-23 ENCOUNTER — Ambulatory Visit (INDEPENDENT_AMBULATORY_CARE_PROVIDER_SITE_OTHER): Payer: Federal, State, Local not specified - PPO | Admitting: Family Medicine

## 2023-03-23 VITALS — BP 120/70 | HR 85 | Resp 16 | Ht 62.0 in | Wt 195.0 lb

## 2023-03-23 DIAGNOSIS — S2220XD Unspecified fracture of sternum, subsequent encounter for fracture with routine healing: Secondary | ICD-10-CM

## 2023-03-23 DIAGNOSIS — H9190 Unspecified hearing loss, unspecified ear: Secondary | ICD-10-CM
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
## 2023-03-23 NOTE — Patient Instructions (Addendum)
 A few things to remember from today's visit:  Closed fracture of sternum with routine healing, unspecified portion of sternum, subsequent encounter - Plan: CBC, CANCELED: CBC  Hearing loss, unspecified hearing loss type, unspecified laterality  Avoid lifting over 8 Lb at the time. Deep breathing a few times through the day. Continue Tylenol 3-4 times per day.  If you need refills for medications you take chronically, please call your pharmacy. Do not use My Chart to request refills or for acute issues that need immediate attention. If you send a my chart message, it may take a few days to be addressed, specially if I am not in the office.  Please be sure medication list is accurate. If a new problem present, please set up appointment sooner than planned today.

## 2023-03-24 ENCOUNTER — Encounter: Payer: Self-pay | Admitting: Family Medicine

## 2023-03-24 LAB — CBC
Hematocrit: 36.9 % (ref 34.0–46.6)
Hemoglobin: 12.1 g/dL (ref 11.1–15.9)
MCH: 30 pg (ref 26.6–33.0)
MCHC: 32.8 g/dL (ref 31.5–35.7)
MCV: 92 fL (ref 79–97)
Platelets: 273 10*3/uL (ref 150–450)
RBC: 4.03 x10E6/uL (ref 3.77–5.28)
RDW: 13.2 % (ref 11.7–15.4)
WBC: 5.6 10*3/uL (ref 3.4–10.8)

## 2023-03-29 ENCOUNTER — Other Ambulatory Visit: Payer: Federal, State, Local not specified - PPO

## 2023-03-29 DIAGNOSIS — I2609 Other pulmonary embolism with acute cor pulmonale: Secondary | ICD-10-CM

## 2023-03-29 LAB — D-DIMER, QUANTITATIVE: D-Dimer, Quant: 0.41 ug{FEU}/mL (ref <0.50)

## 2023-03-31 ENCOUNTER — Encounter: Payer: Self-pay | Admitting: Family Medicine

## 2023-03-31 DIAGNOSIS — M25522 Pain in left elbow: Secondary | ICD-10-CM | POA: Diagnosis not present

## 2023-03-31 DIAGNOSIS — M79632 Pain in left forearm: Secondary | ICD-10-CM | POA: Diagnosis not present

## 2023-04-05 ENCOUNTER — Telehealth: Payer: Self-pay | Admitting: Family Medicine

## 2023-04-05 NOTE — Telephone Encounter (Signed)
 Patient dropped off document FMLA, to be filled out by provider. Patient requested to send it back via Call Patient to pick up within 7-days. Document is located in providers tray at front office.Please advise at Mobile [phone] (mobile)

## 2023-04-05 NOTE — Telephone Encounter (Signed)
 Completed & in your bin to be signed.

## 2023-04-06 DIAGNOSIS — Z0279 Encounter for issue of other medical certificate: Secondary | ICD-10-CM

## 2023-04-07 NOTE — Telephone Encounter (Signed)
 Patient is aware that paperwork is up front and ready for pick up. Copy sent to scan.

## 2023-04-12 ENCOUNTER — Encounter: Payer: Self-pay | Admitting: Family Medicine

## 2023-04-12 ENCOUNTER — Ambulatory Visit

## 2023-04-12 ENCOUNTER — Ambulatory Visit: Admitting: Family Medicine

## 2023-04-12 VITALS — BP 122/70 | HR 95 | Resp 16 | Ht 62.0 in | Wt 197.0 lb

## 2023-04-12 DIAGNOSIS — S2220XA Unspecified fracture of sternum, initial encounter for closed fracture: Secondary | ICD-10-CM | POA: Diagnosis not present

## 2023-04-12 DIAGNOSIS — R7303 Prediabetes: Secondary | ICD-10-CM | POA: Diagnosis not present

## 2023-04-12 DIAGNOSIS — S2220XD Unspecified fracture of sternum, subsequent encounter for fracture with routine healing: Secondary | ICD-10-CM

## 2023-04-12 DIAGNOSIS — E785 Hyperlipidemia, unspecified: Secondary | ICD-10-CM

## 2023-04-12 NOTE — Patient Instructions (Addendum)
 A few things to remember from today's visit:  Closed fracture of sternum with routine healing, unspecified portion of sternum, subsequent encounter - Plan: DG Chest 2 View  Hyperlipidemia, unspecified hyperlipidemia type - Plan: Lipid panel, Hepatic function panel  Prediabetes - Plan: Hemoglobin A1c  No changes today. Deep breathing a few times per day and shoulder range of motion exercises.  If you need refills for medications you take chronically, please call your pharmacy. Do not use My Chart to request refills or for acute issues that need immediate attention. If you send a my chart message, it may take a few days to be addressed, specially if I am not in the office.  Please be sure medication list is accurate. If a new problem present, please set up appointment sooner than planned today.
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
## 2023-04-12 NOTE — Assessment & Plan Note (Signed)
 Hx of TIA and aortic atherosclerosis seen on chest CT. Continue Rosuvastatin 10 mg daily and low fat diet. She will be back tomorrow for FLP and LFT's.

## 2023-04-12 NOTE — Assessment & Plan Note (Signed)
 HgA1C 5.8 in 03/2022. Encouraged consistency with a healthy life style for diabetes prevention. Further recommendations according to HgA1C result.

## 2023-04-13 ENCOUNTER — Other Ambulatory Visit (INDEPENDENT_AMBULATORY_CARE_PROVIDER_SITE_OTHER)

## 2023-04-13 ENCOUNTER — Encounter: Payer: Self-pay | Admitting: Family Medicine

## 2023-04-13 DIAGNOSIS — R7303 Prediabetes: Secondary | ICD-10-CM

## 2023-04-13 DIAGNOSIS — E785 Hyperlipidemia, unspecified: Secondary | ICD-10-CM | POA: Diagnosis not present

## 2023-04-13 LAB — LIPID PANEL
Cholesterol: 164 mg/dL (ref 0–200)
HDL: 58.4 mg/dL (ref >39.00)
LDL Cholesterol: 91 mg/dL (ref 0–99)
NonHDL: 105.92
Total CHOL/HDL Ratio: 3
Triglycerides: 76 mg/dL (ref 0.0–149.0)
VLDL: 15.2 mg/dL (ref 0.0–40.0)

## 2023-04-13 LAB — HEPATIC FUNCTION PANEL
ALT: 20 U/L (ref 0–35)
AST: 18 U/L (ref 0–37)
Albumin: 4.3 g/dL (ref 3.5–5.2)
Alkaline Phosphatase: 78 U/L (ref 39–117)
Bilirubin, Direct: 0.1 mg/dL (ref 0.0–0.3)
Total Bilirubin: 0.4 mg/dL (ref 0.2–1.2)
Total Protein: 7.5 g/dL (ref 6.0–8.3)

## 2023-04-13 LAB — HEMOGLOBIN A1C: Hgb A1c MFr Bld: 5.7 % (ref 4.6–6.5)

## 2023-04-20 ENCOUNTER — Encounter: Payer: Self-pay | Admitting: Internal Medicine

## 2023-04-20 ENCOUNTER — Ambulatory Visit: Payer: Federal, State, Local not specified - PPO | Admitting: Internal Medicine

## 2023-04-20 VITALS — BP 108/69 | HR 62 | Ht 62.0 in | Wt 194.0 lb

## 2023-04-20 DIAGNOSIS — Z86711 Personal history of pulmonary embolism: Secondary | ICD-10-CM

## 2023-04-20 DIAGNOSIS — Z09 Encounter for follow-up examination after completed treatment for conditions other than malignant neoplasm: Secondary | ICD-10-CM
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
## 2023-04-26 ENCOUNTER — Ambulatory Visit: Payer: Federal, State, Local not specified - PPO | Attending: Family Medicine | Admitting: Audiologist

## 2023-04-26 DIAGNOSIS — H903 Sensorineural hearing loss, bilateral: Secondary | ICD-10-CM | POA: Insufficient documentation

## 2023-04-26 NOTE — Procedures (Signed)
  Outpatient Audiology and [facility] [address] [place], [place]  [address] [phone]  AUDIOLOGICAL  EVALUATION  NAME: Cheyenne Hancock     DOB:   [birth year]      MRN: [id number]                                                                                     DATE: 04/26/2023     REFERENT: [place], Betty G, MD STATUS: Outpatient DIAGNOSIS: Sensorineural Hearing Loss Bilateral    History: [name] was seen for an audiological evaluation due to difficulty hearing in noise and from a distance. Her children feel her hearing has gotten worse. [name] thinks she hears well when people face her.  [name] denies pain, pressure, or tinnitus.  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  Medical history shows no additional risk for hearing loss.    Evaluation:  Otoscopy showed a clear view of the tympanic membranes, bilaterally Tympanometry results were consistent with normal middle ear function, bilaterally   Audiometric testing was completed using Conventional Audiometry techniques with insert earphones and supraural headphones. Test results are consistent with mild sloping to moderate, sensorineural  hearing loss, rising to normal hearing bilaterally. Speech Recognition Thresholds were obtained at  45dB HL in the right ear and at 35  dB HL in the left ear. Word Recognition Testing was completed at 75dB and [name] scored 80% in the right ear and 84% in the left ear.   Results:  The test results were reviewed with [name]. She has a mostly mild sensorineural  hearing loss bilaterally  except for a moderate loss at 1kHz only bilaterally. She does not need hearing aids but should monitor her hearing due to noise exposure history.  Audiogram printed and provided to [place].    Recommendations: Audiometric testing recommended to monitor hearing loss for progression again in two  years, or sooner if concerns arise.   33 minutes spent testing and counseling on results.   If you have any questions please feel free to contact me at [phone].  Ammie Ferrier Stalnaker Au.D.  Audiologist   04/26/2023  2:29 PM  Cc: [place], Betty G, MD

## 2023-04-27 ENCOUNTER — Encounter: Payer: Self-pay | Admitting: Family Medicine

## 2023-04-28 ENCOUNTER — Telehealth: Payer: Self-pay

## 2023-04-28 NOTE — Telephone Encounter (Signed)
 Form faxed

## 2023-04-28 NOTE — Telephone Encounter (Signed)
 Copied from CRM [id number]. Topic: General - Other >> Apr 28, 2023 10:19 AM Almira Coaster wrote: Reason for CRM: Trinna Post from [facility] is calling to verify that a clearance for a tooth extraction has been received. Original fax date 04/08/2023 and then resent on 04/27/2023. Patient is scheduled for the appointment on Monday March 31,2025.

## 2023-05-14 ENCOUNTER — Ambulatory Visit: Admitting: Family Medicine

## 2023-05-14 VITALS — BP 110/70 | HR 65 | Temp 97.7°F | Ht 62.0 in | Wt 194.0 lb

## 2023-05-14 DIAGNOSIS — S2220XD Unspecified fracture of sternum, subsequent encounter for fracture with routine healing: Secondary | ICD-10-CM

## 2023-05-14 NOTE — Patient Instructions (Addendum)
-  Provided a 2 week work letter for Cheyenne Hancock duty: No heavy lifting, nothing above 15 pounds, limited activities that involve overhead lifting.  -Continue to take Tylenol as needed for pain. -If pain changes, become worse, or think you need additional time off, please make an appointment with Dr. [place]iland.
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
## 2023-05-29 ENCOUNTER — Other Ambulatory Visit: Payer: Self-pay | Admitting: Family Medicine

## 2023-05-29 DIAGNOSIS — I700 Atherosclerosis of aorta: Secondary | ICD-10-CM

## 2023-05-29 DIAGNOSIS — E785 Hyperlipidemia, unspecified: Secondary | ICD-10-CM

## 2023-07-12 DIAGNOSIS — Z789 Other specified health status: Secondary | ICD-10-CM | POA: Diagnosis not present

## 2023-07-12 DIAGNOSIS — Z6835 Body mass index (BMI) 35.0-35.9, adult: Secondary | ICD-10-CM | POA: Diagnosis not present

## 2023-07-12 DIAGNOSIS — E663 Overweight: Secondary | ICD-10-CM | POA: Diagnosis not present

## 2023-07-19 ENCOUNTER — Telehealth: Payer: Self-pay

## 2023-07-19 NOTE — Telephone Encounter (Signed)
 Copied from CRM [id number]. Topic: General - Other >> Jul 19, 2023 11:07 AM Aisha D wrote: Reason for CRM: Pt stated that she is wanting to get a weight loss shot and they are requesting that the pt has a consent form from the provider. Pt is wanting to know if she can bring the form to the office and have it signed.

## 2023-07-19 NOTE — Telephone Encounter (Signed)
 I cannot provide such letter. Provider managing wt and prescribing medication should be able to determine if it is safe to do so based on co morbilities. Thanks, BJ

## 2023-07-19 NOTE — Telephone Encounter (Signed)
 Spoke with pt, they are needing a letter from you saying that is okay for her to use semaglutide while on Eliquis .

## 2023-07-20 ENCOUNTER — Telehealth: Payer: Self-pay

## 2023-07-20 NOTE — Telephone Encounter (Signed)
 Copied from CRM [id number]. Topic: Clinical - Medication Question >> Jul 19, 2023 10:57 AM Cheyenne Hancock wrote: Reason for CRM: Patient would like to know if she can get the glo-1 shot while on eliquis , she says if yes, please can she be provided with a consent form so she can take it to the bariatric clinic to get the shot  Dr.Ramaswamy can you please advise.  Thank you

## 2023-07-20 NOTE — Telephone Encounter (Signed)
 Generally speaking while on Eliquis  people can take subcutaneous injections.  There is no contraindication for that.  However if there is a specific interaction between the medication of GLP-1 and Eliquis  I do not believe there is 1 but she needs to also double check with primary care of the person was prescribing the GLP-1.  That particular responsibility lies on the prescriber

## 2023-07-21 NOTE — Telephone Encounter (Signed)
 I called the pt and there was no answer- LMTCB. ?

## 2023-07-22 NOTE — Telephone Encounter (Signed)
 Copied from CRM [id number]. Topic: Clinical - Medical Advice >> Jul 21, 2023 10:03 AM Tyronne Galloway wrote: Reason for CRM: Pt missed a call from [facility] regarding previous encounter Patient would like to know if she can get the glo-1 shot while on eliquis , she says if yes, please can she be provided with a consent form so she can take it to the bariatric clinic to get the shot. Please call the pt back at [phone].     Spoke with patient clinic need written consent from MD before  proceeding with injection told patient I will notified Md & nurse and someone should get by to her by Monday or so.

## 2023-07-22 NOTE — Telephone Encounter (Signed)
 I sent her the response from MR via mychart.

## 2023-08-05 NOTE — Progress Notes (Signed)
 [facility] Quality Team Note  Name: Joliene Salvador Date of Birth: 1960/11/15 MRN: [id number] Date: 08/05/2023  [facility] Quality Team has reviewed this patient's chart, please see recommendations below:  [facility] Quality Other; (CHART REVIEWED, ABSTRACTED MOST RECENT BLOOD PRESSURE FOR GAP CLOSURE.)

## 2023-08-10 ENCOUNTER — Other Ambulatory Visit: Payer: Self-pay | Admitting: Internal Medicine

## 2023-08-23 ENCOUNTER — Other Ambulatory Visit

## 2023-08-23 ENCOUNTER — Other Ambulatory Visit (INDEPENDENT_AMBULATORY_CARE_PROVIDER_SITE_OTHER)

## 2023-08-23 DIAGNOSIS — Z86711 Personal history of pulmonary embolism: Secondary | ICD-10-CM | POA: Diagnosis not present

## 2023-08-23 LAB — D-DIMER, QUANTITATIVE: D-Dimer, Quant: 0.49 ug{FEU}/mL (ref <0.50)

## 2023-08-24 ENCOUNTER — Encounter: Payer: Self-pay | Admitting: Nurse Practitioner

## 2023-08-24 ENCOUNTER — Ambulatory Visit: Admitting: Nurse Practitioner

## 2023-08-24 VITALS — BP 126/78 | HR 56 | Temp 97.1°F | Ht 62.0 in | Wt 200.2 lb

## 2023-08-24 DIAGNOSIS — I2699 Other pulmonary embolism without acute cor pulmonale: Secondary | ICD-10-CM | POA: Diagnosis not present

## 2023-08-24 DIAGNOSIS — Z86711 Personal history of pulmonary embolism: Secondary | ICD-10-CM | POA: Diagnosis not present

## 2023-08-24 MED ORDER — APIXABAN 2.5 MG PO TABS: 2.5000 mg | ORAL_TABLET | Freq: Two times a day (BID) | ORAL | 11 refills | Status: AC
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
## 2023-08-24 NOTE — Patient Instructions (Addendum)
 Finish your Eliquis  5 mg Twice daily. After you finish this bottle, switch to Eliquis  2.5 mg Twice daily. We will keep you on this until August 2026 and then consider stopping If you develop any severe, sudden headaches or you are in an accident/have an injury, you need to go to the ED to be evaluated while on this medication.  Monitor for any signs of bleeding or abnormal bruising. You will bruise more easily while taking this.   Ok to start semaglutide injections. No known contraindication with Eliquis     Follow up in 6 months with Katie Sherrod Toothman,NP and Dr. Geronimo in August 2026. If symptoms do not improve or worsen, please contact office for sooner follow up or seek emergency care.

## 2023-08-24 NOTE — Assessment & Plan Note (Addendum)
 Acute PE diagnosed in August 2024 in setting of COVID 19 infection. Transitioned to Eliquis  upon discharge, which she admits to adherence to. No associated side effects. Possible pulmonary infarct on imaging from 09/2022. Repeat CT chest with resolution. She did also have evidence of right heart strain on echocardiogram; however, repeat echo reassuring.  Plan from Dr. Geronimo was to continue full dose anticoagulation for 1 year, ending August 2025, then transition to low dose for 1 year, ending in August 2026. Will de-escalate therapy today to 2.5 mg bid. Recent d dimer normalized. Aware of red flag symptoms and ED precautions. Symptoms of DOE have resolved.  No documented contraindication between DOAC and GLP-1. Recommended continuing with therapy, if recommended by weight management provider.  Reviewed s/s of recurrent clot. ED precautions.   Patient Instructions  Cheyenne Hancock your Eliquis  5 mg Twice daily. After you finish this bottle, switch to Eliquis  2.5 mg Twice daily. We will keep you on this until August 2026 and then consider stopping If you develop any severe, sudden headaches or you are in an accident/have an injury, you need to go to the ED to be evaluated while on this medication.  Monitor for any signs of bleeding or abnormal bruising. You will bruise more easily while taking this.   Ok to start semaglutide injections. No known contraindication with Eliquis     Follow up in 6 months with Cheyenne Carlean Crowl,NP and Dr. Geronimo in August 2026. If symptoms do not improve or worsen, please contact office for sooner follow up or seek emergency care.

## 2023-08-31 ENCOUNTER — Telehealth: Payer: Self-pay

## 2023-08-31 ENCOUNTER — Telehealth: Payer: Self-pay | Admitting: Family Medicine

## 2023-08-31 DIAGNOSIS — E785 Hyperlipidemia, unspecified: Secondary | ICD-10-CM

## 2023-08-31 DIAGNOSIS — I700 Atherosclerosis of aorta: Secondary | ICD-10-CM

## 2023-08-31 MED ORDER — ROSUVASTATIN CALCIUM 10 MG PO TABS: 10.0000 mg | ORAL_TABLET | Freq: Every day | ORAL | 3 refills | Status: AC

## 2023-08-31 NOTE — Telephone Encounter (Signed)
 Copied from CRM [id number]. Topic: Clinical - Medication Refill >> Aug 31, 2023 10:07 AM Chasity T wrote: Medication: rosuvastatin  (CRESTOR ) 10 MG tablet  Has the patient contacted their pharmacy? Yes  This is the patient's preferred pharmacy:  [id number] [place], [place] - [address] AT [facility] & [address] [place] [address] Phone: [phone] Fax: [phone]  Is this the correct pharmacy for this prescription? Yes If no, delete pharmacy and type the correct one.   Has the prescription been filled recently? No  Is the patient out of the medication? Yes  Has the patient been seen for an appointment in the last year OR does the patient have an upcoming appointment? Yes  Can we respond through MyChart? Yes  Agent: Please be advised that Rx refills may take up to 3 business days. We ask that you follow-up with your pharmacy.

## 2023-08-31 NOTE — Telephone Encounter (Unsigned)
 Copied from CRM [id number]. Topic: Clinical - Medication Refill >> Aug 31, 2023 10:07 AM Chasity T wrote: Medication: rosuvastatin  (CRESTOR ) 10 MG tablet  Has the patient contacted their pharmacy? Yes  This is the patient's preferred pharmacy:  [id number] [place], [place] - [address] AT [facility] & [address] [place] [address] Phone: [phone] Fax: [phone]  Is this the correct pharmacy for this prescription? Yes If no, delete pharmacy and type the correct one.   Has the prescription been filled recently? No  Is the patient out of the medication? Yes  Has the patient been seen for an appointment in the last year OR does the patient have an upcoming appointment? Yes  Can we respond through MyChart? Yes  Agent: Please be advised that Rx refills may take up to 3 business days. We ask that you follow-up with your pharmacy.

## 2023-08-31 NOTE — Telephone Encounter (Signed)
 Copied from CRM [id number]. Topic: Clinical - Medical Advice >> Aug 31, 2023 10:09 AM Chasity T wrote: Reason for CRM: Patient is wanting to know if it is okay to take a digestive supplements while taking apixaban  (ELIQUIS ) 2.5 MG TABS due to bloating in stomach. Please call her back at the number on file. Patient did mention that she has to be to work at 3 pm but if she does not answer a voicemail can be left.

## 2023-09-01 NOTE — Telephone Encounter (Signed)
 I spoke with pt, she is aware of message below and verbalized understanding.

## 2023-09-01 NOTE — Telephone Encounter (Signed)
 I do not see any major contraindication to take digestive supplements while on Eliquis . Read labels and avoid it if it has aspirin or NSAID's. Thanks, BJ

## 2023-09-15 ENCOUNTER — Other Ambulatory Visit: Payer: Self-pay | Admitting: Family Medicine

## 2023-09-15 DIAGNOSIS — Z1231 Encounter for screening mammogram for malignant neoplasm of breast: Secondary | ICD-10-CM

## 2023-09-30 ENCOUNTER — Ambulatory Visit

## 2023-10-07 ENCOUNTER — Ambulatory Visit
Admission: RE | Admit: 2023-10-07 | Discharge: 2023-10-07 | Disposition: A | Discharge status: Home / Self Care | Source: Ambulatory Visit | Attending: Family Medicine | Admitting: Family Medicine

## 2023-10-07 DIAGNOSIS — Z1231 Encounter for screening mammogram for malignant neoplasm of breast: Secondary | ICD-10-CM

## 2023-10-13 ENCOUNTER — Other Ambulatory Visit: Payer: Self-pay | Admitting: Family Medicine

## 2023-10-13 DIAGNOSIS — R928 Other abnormal and inconclusive findings on diagnostic imaging of breast: Secondary | ICD-10-CM

## 2023-10-19 ENCOUNTER — Ambulatory Visit
Admission: RE | Admit: 2023-10-19 | Discharge: 2023-10-19 | Disposition: A | Discharge status: Home / Self Care | Source: Ambulatory Visit | Attending: Family Medicine | Admitting: Family Medicine

## 2023-10-19 DIAGNOSIS — R928 Other abnormal and inconclusive findings on diagnostic imaging of breast: Secondary | ICD-10-CM

## 2023-10-19 DIAGNOSIS — N6489 Other specified disorders of breast: Secondary | ICD-10-CM | POA: Diagnosis not present

## 2023-10-19 DIAGNOSIS — R92322 Mammographic fibroglandular density, left breast: Secondary | ICD-10-CM | POA: Diagnosis not present

## 2023-11-04 ENCOUNTER — Ambulatory Visit: Payer: Self-pay

## 2023-11-04 ENCOUNTER — Ambulatory Visit: Admitting: Family Medicine

## 2023-11-04 VITALS — BP 134/74 | HR 68 | Temp 98.4°F | Ht 62.0 in | Wt 194.0 lb

## 2023-11-04 DIAGNOSIS — M545 Low back pain, unspecified: Secondary | ICD-10-CM | POA: Diagnosis not present

## 2023-11-04 DIAGNOSIS — S46811A Strain of other muscles, fascia and tendons at shoulder and upper arm level, right arm, initial encounter: Secondary | ICD-10-CM

## 2023-11-04 DIAGNOSIS — M549 Dorsalgia, unspecified: Secondary | ICD-10-CM | POA: Diagnosis not present

## 2023-11-04 DIAGNOSIS — S46812A Strain of other muscles, fascia and tendons at shoulder and upper arm level, left arm, initial encounter: Secondary | ICD-10-CM

## 2023-11-04 MED ORDER — CYCLOBENZAPRINE HCL 5 MG PO TABS: 5.0000 mg | ORAL_TABLET | Freq: Three times a day (TID) | ORAL | 0 refills | Status: DC | PRN

## 2023-11-04 MED ORDER — PREDNISONE 10 MG PO TABS: ORAL_TABLET | ORAL | 0 refills | Status: DC

## 2023-11-04 NOTE — Progress Notes (Signed)
 Established Patient Office Visit   Subjective  Patient ID: Cheyenne Hancock, female    DOB: [birth year]  Age: 63 y.o. MRN: [id number]  Chief Complaint  Patient presents with   Acute Visit    Patient came in today for upper and Lower back pain and upper body(shoulders) pain when lifting at work on thur 9/25 pain started 9/26, rate of pain 7/10    Pt is a 63 yo female followed by Dr. [place] and seen for acute concern.  Patient endorses b/l shoulder, upper back and lower back pain x 1 wk.  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  Pt states the new machine goes through 300,000 pieces where the old machine processed 100,000 pieces of mail.  Pt endorses increased lifting of 50-60 lbs due to this.  Pt stayed out of work a few days, but had to leave early yesterday due to pain.  Pain is a 7/10, soreness at baseline, but a sharp sensation with bending and lifting in midline low back.  Tried tylenol  but did not help.  Unable to take NSAIDs due to Eliquis .    Patient Active Problem List   Diagnosis Date Noted   Prediabetes 04/12/2023   DOE (dyspnea on exertion) 01/25/2023   Nephrolithiasis 09/23/2022   Atherosclerosis of aorta 09/23/2022   Pulmonary embolism (HCC) 09/18/2022   Gastroesophageal reflux disease 07/22/2022   Ganglion cyst of volar aspect of left wrist 07/22/2022   High grade squamous intraepithelial lesion (HGSIL), grade 2 CIN, on biopsy of cervix 07/20/2022   Routine general medical examination at a health care facility 03/23/2022   Hyperlipidemia 01/08/2020   Subclinical hypothyroidism 04/07/2019   Generalized osteoarthritis of multiple sites 05/10/2017   Varicose veins of both lower extremities 04/14/2017   Hypertension, essential, benign 10/06/2016   Class 2 obesity with body mass index (BMI) of 37.0 to 37.9 in adult 10/06/2016   Past Medical History:  Diagnosis Date   Arthritis 07/2020   Chicken pox    Coronary artery disease  03/2022   Doctor suggested taking statin med.  The list of side effects was unsettling, so l went on a low fat diet instead.   History of pulmonary embolus (PE) 09/2022   Hypertension    Dx 2009-2010   Kidney stones    Osteopenia    DEXA 11/22/08   Varicose veins of bilateral lower extremities with pain    Past Surgical History:  Procedure Laterality Date   TUBAL LIGATION  11/24/1984   Social History   Tobacco Use   Smoking status: Never    Passive exposure: Past   Smokeless tobacco: Never  Vaping Use   Vaping status: Never Used  Substance Use Topics   Alcohol use: No   Drug use: No   Family History  Problem Relation Age of Onset   Arthritis Mother    Stroke Mother    Hypertension Mother    Cancer Sister        breast   Breast cancer Sister    Cancer Paternal Uncle        lung and prostate   Breast cancer Maternal Grandmother    Cancer Maternal Grandmother    Stomach cancer Maternal Aunt    Cancer Maternal Aunt    Cancer Maternal Aunt    Cancer Maternal Uncle    Cancer Maternal Uncle    Colon cancer Neg Hx    Esophageal cancer Neg Hx    Liver cancer  Neg Hx    Pancreatic cancer Neg Hx    Rectal cancer Neg Hx    No Known Allergies  ROS Negative unless stated above    Objective:     BP 134/74 (BP Location: Left Arm, Patient Position: Sitting, Cuff Size: Large)   Pulse 68   Temp 98.4 F (36.9 C) (Oral)   Ht 5' 2 (1.575 m)   Wt 194 lb (88 kg)   SpO2 100%   BMI 35.48 kg/m  BP Readings from Last 3 Encounters:  11/04/23 134/74  08/24/23 126/78  05/14/23 110/70   Wt Readings from Last 3 Encounters:  11/04/23 194 lb (88 kg)  08/24/23 200 lb 3.2 oz (90.8 kg)  05/14/23 194 lb (88 kg)      Physical Exam Constitutional:      General: She is not in acute distress.    Appearance: Normal appearance.  HENT:     Head: Normocephalic and atraumatic.     Nose: Nose normal.     Mouth/Throat:     Mouth: Mucous membranes are moist.  Cardiovascular:      Rate and Rhythm: Normal rate and regular rhythm.     Heart sounds: Normal heart sounds. No murmur heard.    No gallop.  Pulmonary:     Effort: Pulmonary effort is normal. No respiratory distress.     Breath sounds: Normal breath sounds. No wheezing, rhonchi or rales.  Musculoskeletal:     Comments: No TTP of cervical, thoracic, or lumbar spine midline or paraspinal muscles.  No TTP of b/l shoulders.  Increased muscle tension in lateral trapezius muscles b/l, deltoids, and rotator cuff.  Skin:    General: Skin is warm and dry.  Neurological:     Mental Status: She is alert and oriented to person, place, and time.        06/12/2022    8:05 AM 03/23/2022    2:00 PM 06/02/2019    8:37 AM  Depression screen PHQ 2/9  Decreased Interest 0 0 0  Down, Depressed, Hopeless 0 0 0  PHQ - 2 Score 0 0 0  Altered sleeping   0  Tired, decreased energy   0  Change in appetite   0  Feeling bad or failure about yourself    0  Trouble concentrating   0  Moving slowly or fidgety/restless   0  Suicidal thoughts   0  PHQ-9 Score   0      06/02/2019    8:37 AM  GAD 7 : Generalized Anxiety Score  Nervous, Anxious, on Edge 0  Control/stop worrying 0  Worry too much - different things 0  Trouble relaxing 0  Restless 0  Easily annoyed or irritable 0  Afraid - awful might happen 0  Total GAD 7 Score 0     No results found for any visits on 11/04/23.    Assessment & Plan:   Acute upper back pain -     predniSONE; Take 4 tabs every morning for 3 days, 3 tabs for 2 days, 2 tabs for 2 days, 1 tab for 1 day.  Dispense: 23 tablet; Refill: 0 -     Cyclobenzaprine  HCl; Take 1 tablet (5 mg total) by mouth 3 (three) times daily as needed for muscle spasms.  Dispense: 15 tablet; Refill: 0  Acute midline low back pain without sciatica -     predniSONE; Take 4 tabs every morning for 3 days, 3 tabs for 2 days, 2 tabs for  2 days, 1 tab for 1 day.  Dispense: 23 tablet; Refill: 0 -     Cyclobenzaprine  HCl;  Take 1 tablet (5 mg total) by mouth 3 (three) times daily as needed for muscle spasms.  Dispense: 15 tablet; Refill: 0  Strain of left trapezius muscle, initial encounter -     predniSONE; Take 4 tabs every morning for 3 days, 3 tabs for 2 days, 2 tabs for 2 days, 1 tab for 1 day.  Dispense: 23 tablet; Refill: 0 -     Cyclobenzaprine  HCl; Take 1 tablet (5 mg total) by mouth 3 (three) times daily as needed for muscle spasms.  Dispense: 15 tablet; Refill: 0  Strain of right trapezius muscle, initial encounter -     predniSONE; Take 4 tabs every morning for 3 days, 3 tabs for 2 days, 2 tabs for 2 days, 1 tab for 1 day.  Dispense: 23 tablet; Refill: 0 -     Cyclobenzaprine  HCl; Take 1 tablet (5 mg total) by mouth 3 (three) times daily as needed for muscle spasms.  Dispense: 15 tablet; Refill: 0  Acute muscle strain of upper back and lumbar spine likely 2/2 overuse from increased heavy lifting at work.  No red flag sx.  Discussed supportive care including heat, ice, stretching, rest, topical analgesics.  As Tylenol  has not been effective and unable to take NSAIDs due to Eliquis  use, prednisone taper.  Muscle relaxer if needed.  Discussed r/b/a of both medications.  Advised on likely duration of symptoms.  Given a note for work as pt request to be out for the next few days.  F/u with PCP prn for continued or worsened symptoms.  Return if symptoms worsen or fail to improve.   Clotilda JONELLE Single, MD

## 2023-11-04 NOTE — Patient Instructions (Addendum)
 Okay to continue taking Tylenol .  You can also use over-the-counter topical creams such as Aspercreme, Biofreeze, IcyHot, etc.  Try using heat or ice on your back.

## 2023-11-04 NOTE — Telephone Encounter (Addendum)
 Appointment made for today 11/04/2023 at 9:30am with Dr Clotilda Single   FYI Only or Action Required?: FYI only for provider.  Patient was last seen in primary care on 05/14/2023 by Billy Philippe SAUNDERS, NP.  Called Nurse Triage reporting Back Pain.  Symptoms began 6 days ago.  Interventions attempted: OTC medications: Tylenol  and Rest, hydration, or home remedies.  Symptoms are: unchanged.  Triage Disposition: See PCP When Office is Open (Within 3 Days)  Patient/caregiver understands and will follow disposition?: Yes           Copied from CRM #[id number]. Topic: Clinical - Red Word Triage >> Nov 04, 2023  7:58 AM Pinkey ORN wrote: Red Word that prompted transfer to Nurse Triage: Pain In Shoulders + Lower Back Reason for Disposition  [1] MODERATE back pain (e.g., interferes with normal activities) AND [2] present > 3 days  Answer Assessment - Initial Assessment Questions Patient is advised that if anything worsens to go to the Emergency Room. Patient verbalized understanding.     1. ONSET: When did the pain begin? (e.g., minutes, hours, days)     6 days ago at work 2. LOCATION: Where does it hurt? (upper, mid or lower back)     Back---lower and shoulders when lifting things 3. SEVERITY: How bad is the pain?  (e.g., Scale 1-10; mild, moderate, or severe)     7----sore but worse with lifting things 4. PATTERN: Is the pain constant? (e.g., yes, no; constant, intermittent)      Constant soreness 5. RADIATION: Does the pain shoot into your legs or somewhere else?     Upper body mostly shoulders when lifting 6. CAUSE:  What do you think is causing the back pain?      Busy day at work--lifting a lot of boxes 7. BACK OVERUSE:  Any recent lifting of heavy objects, strenuous work or exercise?     Busy day at work lifting a lot of boxes 8. MEDICINES: What have you taken so far for the pain? (e.g., nothing, acetaminophen , NSAIDS)     Tylenol  9. NEUROLOGIC  SYMPTOMS: Do you have any weakness, numbness, or problems with bowel/bladder control?     denies 10. OTHER SYMPTOMS: Do you have any other symptoms? (e.g., fever, abdomen pain, burning with urination, blood in urine)       denies  Protocols used: Back Pain-A-AH

## 2024-02-15 ENCOUNTER — Ambulatory Visit: Payer: Self-pay

## 2024-02-15 NOTE — Telephone Encounter (Signed)
 FYI Only or Action Required?: FYI only for provider: appointment scheduled on 02/16/24.  Patient was last seen in primary care on 11/04/2023 by Mercer Clotilda SAUNDERS, MD.  Called Nurse Triage reporting Back Pain.  Symptoms began a week ago.  Interventions attempted: Nothing.  Symptoms are: stable.  Triage Disposition: See PCP When Office is Open (Within 3 Days)  Patient/caregiver understands and will follow disposition?: Yes                                  1. ONSET: When did the pain begin? (e.g., minutes, hours, days)     A week ago 2. LOCATION: Where does it hurt? (upper, mid or lower back)     Middle lower back  3. SEVERITY: How bad is the pain?  (e.g., Scale 1-10; mild, moderate, or severe)     Rates pain an 8 4. PATTERN: Is the pain constant? (e.g., yes, no; constant, intermittent)      Intensity is intermittent, states pain intensifies upon movement  5. RADIATION: Does the pain shoot into your legs or somewhere else?     Lower right side 6. CAUSE:  What do you think is causing the back pain?      Unsure 7. BACK OVERUSE:  Any recent lifting of heavy objects, strenuous work or exercise?     Yes, states she does heavy lifting at her place of work 8. MEDICINES: What have you taken so far for the pain? (e.g., nothing, acetaminophen , NSAIDS)     Nothing 9. NEUROLOGIC SYMPTOMS: Do you have any weakness, numbness, or problems with bowel/bladder control?     Denies 10. OTHER SYMPTOMS: Do you have any other symptoms? (e.g., fever, abdomen pain, burning with urination, blood in urine)     Urinary frequency (states this is due to increased water intake Red blood spot on side of right eye, reports vision is normal Mild headache past 1-2 days (states she gave up caffeine recently), denies dizziness  Reason for Disposition  [1] MODERATE back pain (e.g., interferes with normal activities) AND [2] present > 3 days  Protocols used: Back  Pain-A-AH  Copied from CRM #[id number]. Topic: Clinical - Red Word Triage >> Feb 15, 2024  3:53 PM Macario HERO wrote: Red Word that prompted transfer to Nurse Triage: Patient called requesting an appointment. Stated she's been experiencing lower back pain, side pain and blood spot in her eye.

## 2024-02-16 ENCOUNTER — Ambulatory Visit: Admitting: Family Medicine

## 2024-02-16 ENCOUNTER — Other Ambulatory Visit

## 2024-02-16 ENCOUNTER — Encounter: Payer: Self-pay | Admitting: Family Medicine

## 2024-02-16 VITALS — BP 130/80 | HR 81 | Temp 98.0°F | Resp 16 | Ht 62.0 in | Wt 198.6 lb

## 2024-02-16 DIAGNOSIS — R202 Paresthesia of skin: Secondary | ICD-10-CM | POA: Diagnosis not present

## 2024-02-16 DIAGNOSIS — E785 Hyperlipidemia, unspecified: Secondary | ICD-10-CM

## 2024-02-16 DIAGNOSIS — I10 Essential (primary) hypertension: Secondary | ICD-10-CM

## 2024-02-16 DIAGNOSIS — E038 Other specified hypothyroidism: Secondary | ICD-10-CM

## 2024-02-16 DIAGNOSIS — M545 Low back pain, unspecified: Secondary | ICD-10-CM

## 2024-02-16 DIAGNOSIS — Z23 Encounter for immunization: Secondary | ICD-10-CM | POA: Diagnosis not present

## 2024-02-16 DIAGNOSIS — R7303 Prediabetes: Secondary | ICD-10-CM

## 2024-02-16 DIAGNOSIS — G4762 Sleep related leg cramps: Secondary | ICD-10-CM | POA: Diagnosis not present

## 2024-02-16 LAB — COMPREHENSIVE METABOLIC PANEL WITH GFR
ALT: 16 U/L (ref 3–35)
AST: 20 U/L (ref 5–37)
Albumin: 4.5 g/dL (ref 3.5–5.2)
Alkaline Phosphatase: 72 U/L (ref 39–117)
BUN: 9 mg/dL (ref 6–23)
CO2: 27 meq/L (ref 19–32)
Calcium: 9.7 mg/dL (ref 8.4–10.5)
Chloride: 102 meq/L (ref 96–112)
Creatinine, Ser: 0.81 mg/dL (ref 0.40–1.20)
GFR: 76.08 mL/min
Glucose, Bld: 81 mg/dL (ref 70–99)
Potassium: 4.2 meq/L (ref 3.5–5.1)
Sodium: 138 meq/L (ref 135–145)
Total Bilirubin: 0.6 mg/dL (ref 0.2–1.2)
Total Protein: 7.9 g/dL (ref 6.0–8.3)

## 2024-02-16 LAB — C-REACTIVE PROTEIN: CRP: 0.6 mg/dL — ABNORMAL LOW (ref 1.0–20.0)

## 2024-02-16 LAB — LIPID PANEL
Cholesterol: 204 mg/dL — ABNORMAL HIGH (ref 28–200)
HDL: 65.7 mg/dL
LDL Cholesterol: 125 mg/dL — ABNORMAL HIGH (ref 10–99)
NonHDL: 138.03
Total CHOL/HDL Ratio: 3
Triglycerides: 65 mg/dL (ref 10.0–149.0)
VLDL: 13 mg/dL (ref 0.0–40.0)

## 2024-02-16 LAB — CBC
HCT: 40.6 % (ref 36.0–46.0)
Hemoglobin: 13.5 g/dL (ref 12.0–15.0)
MCHC: 33.1 g/dL (ref 30.0–36.0)
MCV: 87.4 fl (ref 78.0–100.0)
Platelets: 260 K/uL (ref 150.0–400.0)
RBC: 4.65 Mil/uL (ref 3.87–5.11)
RDW: 13.6 % (ref 11.5–15.5)
WBC: 4.7 K/uL (ref 4.0–10.5)

## 2024-02-16 LAB — VITAMIN B12: Vitamin B-12: 907 pg/mL (ref 211–911)

## 2024-02-16 LAB — HEMOGLOBIN A1C: Hgb A1c MFr Bld: 5.7 % (ref 4.6–6.5)

## 2024-02-16 LAB — TSH: TSH: 1.68 u[IU]/mL (ref 0.35–5.50)

## 2024-02-16 MED ORDER — CYCLOBENZAPRINE HCL 5 MG PO TABS: 5.0000 mg | ORAL_TABLET | Freq: Every evening | ORAL | 1 refills | Status: AC | PRN

## 2024-02-16 NOTE — Patient Instructions (Addendum)
 A few things to remember from today's visit:  Hypertension, essential, benign - Plan: Comprehensive metabolic panel with GFR, CBC  Subclinical hypothyroidism  Prediabetes - Plan: Hemoglobin A1c  Hyperlipidemia, unspecified hyperlipidemia type - Plan: Lipid panel, Comprehensive metabolic panel with GFR  Paresthesias - Plan: TSH, Vitamin B12, CBC, C-reactive protein, Protein Electrophoresis, Urine Rflx.  Nocturnal leg cramps - Plan: TSH, CBC No changes today. If new symptoms present we may need to have neuro consultation.  If you need refills for medications you take chronically, please call your pharmacy. Do not use My Chart to request refills or for acute issues that need immediate attention. If you send a my chart message, it may take a few days to be addressed, specially if I am not in the office.  Please be sure medication list is accurate. If a new problem present, please set up appointment sooner than planned today.

## 2024-02-16 NOTE — Progress Notes (Signed)
 "  ACUTE VISIT Chief Complaint  Patient presents with   Back Pain    Lower back and right side pain x 1 week - feeling burning/tingling sensation on her right arm and both legs - sometimes seem off balance.   leg cramps    Mostly happens at night - patient wants labs - fasting    Discussed the use of AI scribe software for clinical note transcription with the patient, who gave verbal consent to proceed.  History of Present Illness Cheyenne Hancock is a 64 year old female PMHx significant for pulmonary embolism, HTN, atherosclerosis of aorta, GERD, subclinical hypothyroidism, generalized OA, and HLD here today with above concerns. She was last seen on 04/12/2023, since then she has seen pulmonologist and cardiologist  She has been experiencing lower back pain primarily in the middle of the back, sometimes radiating upwards to the thoracic region, for the past week. The pain is described as soreness rather than sharp pain, with a severity of 8 out of 10. 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 She has not taken any medication for the pain due to being on blood thinners (Eliquis ).  For the past month or so, she has experienced a burning and tingling sensation in her right arm, elbow, and hand, as well as in both legs. These symptoms occur primarily at night when she is in bed. She also experiences cramps in both legs, causing her feet to turn inwards and her toes to contract. She has not taken any medication for these symptoms recently, but previously used Flexeril , prescribed in 11/2023,which she found helpful.  No associated weakness, neck pain, or severe headache.  Brain MRI done in 02/2023 due to acute neurologic deficit.  He showed no acute intracranial abnormality, findings of chronic small vessel ischemia. Head and neck CTA in 02/2023 showed1. No intracranial large vessel occlusion. 2. Mild  atherosclerotic change at the right ICA bulb but without stenosis. 3. Left PCA takes fetal origin from the anterior circulation. There is some narrowing and irregularity within the PCA branches, worse on the right than the left.   Aortic Atherosclerosis (ICD10-I70.0) She noticed a blood spot in her eye yesterday morning. She also reports a tightness in her head and a mild frontal headache that started after noticing the eye issue. She stopped caffeine intake on Sunday due to a church fast, which she suspects might be contributing to her headache.  No associated fever, chills, cough, difficulty breathing, wheezing, or visual changes.  HLD: She is inquiring about being able to stop statin. Hx of TIA. She is on Rosuvastatin 10 mg daily.  Component     Latest Ref Rng 04/13/2023  Cholesterol     28 - 200 mg/dL 164   Triglycerides     10.0 - 149.0 mg/dL 76.0   HDL Cholesterol     >39.00 mg/dL 58.40   VLDL     0.0 - 40.0 mg/dL 15.2   LDL (calc)     10  - 99 mg/dL 91   Total CHOL/HDL Ratio 3   NonHDL 105.92    On Eliquis  5 mg daily for PE, follows with pulmonologist.  HTN on non pharmacologic treatment.  Review of Systems  Constitutional:  Negative for activity change, appetite change, chills and fever.  HENT:  Negative for sore throat and trouble swallowing.   Respiratory:  Negative for cough and wheezing.   Cardiovascular:  Negative for chest pain, palpitations and leg swelling.  Gastrointestinal:  Negative for abdominal pain, nausea and vomiting.  Endocrine: Negative for cold intolerance and heat intolerance.  Genitourinary:  Negative for decreased urine volume, dysuria and hematuria.  Musculoskeletal:  Positive for back pain.  Skin:  Negative for rash.  Neurological:  Negative for syncope, facial asymmetry and weakness.  See other pertinent positives and negatives in HPI.  Medications Ordered Prior to Encounter[1]  Past Medical History:  Diagnosis Date   Arthritis 07/2020    Chicken pox    Coronary artery disease 03/2022   Doctor suggested taking statin med.  The list of side effects was unsettling, so l went on a low fat diet instead.   History of pulmonary embolus (PE) 09/2022   Hypertension    Dx 2009-2010   Kidney stones    Osteopenia    DEXA 11/22/08   Varicose veins of bilateral lower extremities with pain    Allergies[2]  Social History   Socioeconomic History   Marital status: Divorced    Spouse name: Not on file   Number of children: 2   Years of education: Not on file   Highest education level: Some college, no degree  Occupational History   Not on file  Tobacco Use   Smoking status: Never    Passive exposure: Past   Smokeless tobacco: Never  Vaping Use   Vaping status: Never Used  Substance and Sexual Activity   Alcohol use: No   Drug use: No   Sexual activity: Not Currently    Birth control/protection: Post-menopausal  Other Topics Concern   Not on file  Social History Narrative   Pt lives alone    Pt works    Social Drivers of Health   Tobacco Use: Low Risk (02/16/2024)   Patient History    Smoking Tobacco Use: Never    Smokeless Tobacco Use: Never    Passive Exposure: Past  Financial Resource Strain: Low Risk (02/16/2024)   Overall Financial Resource Strain (CARDIA)    Difficulty of Paying Living Expenses: Not hard at all  Food Insecurity: No Food Insecurity (02/16/2024)   Epic    Worried About Programme Researcher, Broadcasting/film/video in the Last Year: Never true    Ran Out of Food in the Last Year: Never true  Transportation Needs: Patient Declined (02/16/2024)   Epic    Lack of Transportation (Medical): Patient declined    Lack of Transportation (Non-Medical): Patient declined  Physical Activity: Sufficiently Active (02/16/2024)   Exercise Vital Sign    Days of Exercise per Week: 4 days    Minutes of Exercise per Session: 150+ min  Stress: No Stress Concern Present (02/16/2024)   [employer] of Occupational Health - Occupational  Stress Questionnaire    Feeling of Stress: Not at all  Social Connections: Moderately Integrated (02/16/2024)   Social Connection and Isolation Panel    Frequency of Communication with Friends and Family: More than three times a week    Frequency of Social Gatherings with Friends and Family: Once a week    Attends Religious Services: More than 4 times per year    Active Member of Clubs or Organizations: Yes    Attends Banker Meetings: More than 4 times per year    Marital Status: Divorced  Depression (PHQ2-9): Low Risk (02/16/2024)   Depression (PHQ2-9)    PHQ-2 Score: 0  Alcohol Screen: Not on file  Housing: Patient Declined (02/16/2024)   Epic  Unable to Pay for Housing in the Last Year: Patient declined    Number of Times Moved in the Last Year: Not on file    Homeless in the Last Year: Patient declined  Utilities: Not At Risk (09/18/2022)   [facility] Utilities    Threatened with loss of utilities: No  Health Literacy: Not on file    Vitals:   02/16/24 1408  BP: 130/80  Pulse: 81  Resp: 16  Temp: 98 F (36.7 C)  SpO2: 96%   Body mass index is 36.32 kg/m.  Physical Exam Vitals and nursing note reviewed.  Constitutional:      General: She is not in acute distress.    Appearance: She is well-developed.  HENT:     Head: Normocephalic and atraumatic.     Mouth/Throat:     Mouth: Mucous membranes are moist.     Pharynx: Oropharynx is clear.  Eyes:     Conjunctiva/sclera: Conjunctivae normal.  Cardiovascular:     Rate and Rhythm: Normal rate and regular rhythm.     Pulses:          Dorsalis pedis pulses are 2+ on the right side and 2+ on the left side.     Heart sounds: No murmur heard. Pulmonary:     Effort: Pulmonary effort is normal. No respiratory distress.     Breath sounds: Normal breath sounds.  Abdominal:     Palpations: Abdomen is soft. There is no hepatomegaly or mass.     Tenderness: There is no abdominal tenderness.  Musculoskeletal:      Lumbar back: No tenderness or bony tenderness. Negative right straight leg raise test and negative left straight leg raise test.  Lymphadenopathy:     Cervical: No cervical adenopathy.  Skin:    General: Skin is warm.     Findings: No erythema or rash.  Neurological:     General: No focal deficit present.     Mental Status: She is alert and oriented to person, place, and time.     Cranial Nerves: No cranial nerve deficit.     Gait: Gait normal.     Deep Tendon Reflexes:     Reflex Scores:      Patellar reflexes are 2+ on the right side and 2+ on the left side. Psychiatric:        Mood and Affect: Mood and affect normal.   ASSESSMENT AND PLAN:  Ms. Annelle Behrendt was seen today for back pain and leg cramps.  Diagnoses and all orders for this visit: Orders Placed This Encounter  Procedures   TSH   Vitamin B12   Lipid panel   Comprehensive metabolic panel with GFR   Hemoglobin A1c   CBC   C-reactive protein   Protein Electrophoresis, Urine Rflx.   Lab Results  Component Value Date   VITAMINB12 907 02/16/2024   Lab Results  Component Value Date   TSH 1.68 02/16/2024   Lab Results  Component Value Date   NA 138 02/16/2024   CL 102 02/16/2024   K 4.2 02/16/2024   CO2 27 02/16/2024   BUN 9 02/16/2024   CREATININE 0.81 02/16/2024   GFR 76.08 02/16/2024   CALCIUM  9.7 02/16/2024   ALBUMIN 4.5 02/16/2024   GLUCOSE 81 02/16/2024   Lab Results  Component Value Date   ALT 16 02/16/2024   AST 20 02/16/2024   ALKPHOS 72 02/16/2024   BILITOT 0.6 02/16/2024   Lab Results  Component Value Date  HGBA1C 5.7 02/16/2024   Lab Results  Component Value Date   CRP 0.6 (L) 02/16/2024   Hypertension, essential, benign Assessment & Plan: BP otherwise adequately controlled on non pharmacologic treatment. Monitor BP at home. Eye exam is current. Continue low salt diet.  Orders: -     Comprehensive metabolic panel with GFR; Future -     CBC;  Future  Subclinical hypothyroidism Assessment & Plan: She is not on pharmacologic treatment. Further recommendation will be given according to TSH results.   Prediabetes Assessment & Plan: HgA1C 5.7 in 04/2023. Encouraged consistency with a healthy life style for diabetes prevention. Further recommendations according to HgA1C result.  Orders: -     Hemoglobin A1c; Future  Hyperlipidemia, unspecified hyperlipidemia type Assessment & Plan: Last LDL 91 in 04/2023. We discussed pharmacologic recommendations in regard to pharmacologic treatment and LDL goals. Hx of TIA, LDL goal < 70. Continue Rosuvastatin  10 mg daily and low fat diet. She is fasting today, would like lipid panel done today.  Orders: -     Lipid panel; Future -     Comprehensive metabolic panel with GFR; Future  Paresthesias Problem has been going on for some time, usually at night and stable, no associated weakness. Possible etiologies discussed. Monitor for new symptoms. May consider neuro evaluation if worsening or new symptoms present. Further recommendations according to lab results.  -     TSH; Future -     Vitamin B12; Future -     CBC; Future -     C-reactive protein; Future -     Protein Electrophoresis, Urine Rflx.; Future  Nocturnal leg cramps Possible causes dicussed. Reports that Flexeril  5 mg helped, so she can continue at bedtime as needed. We discussed some side effects of med.  -     TSH; Future -     CBC; Future -     Cyclobenzaprine  HCl; Take 1 tablet (5 mg total) by mouth at bedtime as needed for muscle spasms.  Dispense: 30 tablet; Refill: 1  Acute midline low back pain without sciatica Tylenol  500 mg 3-4 times per day prn, topical icy hot or asper cream. I do not think imaging is needed. Monitor for new symptoms. Flexeril  5 mg at bedtime as needed. Light duty at work x 4 weeks.  -     Cyclobenzaprine  HCl; Take 1 tablet (5 mg total) by mouth at bedtime as needed for muscle spasms.   Dispense: 30 tablet; Refill: 1  Immunization due -     Pneumococcal conjugate vaccine 20-valent  Return if symptoms worsen or fail to improve, for keep next appointment.  Tayia Stonesifer G. Seraphine Gudiel, MD  [facility]. [place] office.     [1]  Current Outpatient Medications on File Prior to Visit  Medication Sig Dispense Refill   apixaban  (ELIQUIS ) 2.5 MG TABS tablet Take 1 tablet (2.5 mg total) by mouth 2 (two) times daily. 60 tablet 11   rosuvastatin  (CRESTOR ) 10 MG tablet Take 1 tablet (10 mg total) by mouth daily. 90 tablet 3   No current facility-administered medications on file prior to visit.  [2] No Known Allergies  "

## 2024-02-17 ENCOUNTER — Ambulatory Visit: Payer: Self-pay | Admitting: Family Medicine

## 2024-02-17 ENCOUNTER — Telehealth: Payer: Self-pay | Admitting: *Deleted

## 2024-02-17 NOTE — Telephone Encounter (Signed)
 No documentation,if patient calls back will address

## 2024-02-17 NOTE — Telephone Encounter (Signed)
 LMTCB x 1

## 2024-02-17 NOTE — Assessment & Plan Note (Signed)
 She is not on pharmacologic treatment. Further recommendation will be given according to TSH results.

## 2024-02-17 NOTE — Assessment & Plan Note (Signed)
 Last LDL 91 in 04/2023. We discussed pharmacologic recommendations in regard to pharmacologic treatment and LDL goals. Hx of TIA, LDL goal < 70. Continue Rosuvastatin  10 mg daily and low fat diet. She is fasting today, would like lipid panel done today.

## 2024-02-17 NOTE — Assessment & Plan Note (Signed)
 BP otherwise adequately controlled on non pharmacologic treatment. Monitor BP at home. Eye exam is current. Continue low salt diet.

## 2024-02-17 NOTE — Assessment & Plan Note (Signed)
 HgA1C 5.7 in 04/2023. Encouraged consistency with a healthy life style for diabetes prevention. Further recommendations according to HgA1C result.

## 2024-02-18 ENCOUNTER — Other Ambulatory Visit: Payer: Self-pay

## 2024-02-18 ENCOUNTER — Telehealth: Payer: Self-pay

## 2024-02-18 DIAGNOSIS — I700 Atherosclerosis of aorta: Secondary | ICD-10-CM

## 2024-02-18 DIAGNOSIS — E785 Hyperlipidemia, unspecified: Secondary | ICD-10-CM

## 2024-02-18 MED ORDER — ROSUVASTATIN CALCIUM 20 MG PO TABS: 20.0000 mg | ORAL_TABLET | Freq: Every day | ORAL | 2 refills | Status: AC

## 2024-02-18 NOTE — Telephone Encounter (Signed)
 Patient has been informed and voiced understanding of her lab results.   Copied from CRM [id number]. Topic: Clinical - Lab/Test Results >> Feb 18, 2024  8:13 AM Willma SAUNDERS wrote: Reason for CRM: Relayed lab results, patient has additional question would like to speak to a nurse.   Patient can be reached at [phone]

## 2024-02-18 NOTE — Telephone Encounter (Signed)
 Spoke with the patient and she has been informed of her lab results and she has voiced understanding and her new medication has been sent to the pharmacy.

## 2024-02-20 LAB — PROTEIN ELECTROPHORESIS, URINE REFLEX
Albumin ELP, Urine: 100 %
Alpha-1-Globulin, U: 0 %
Alpha-2-Globulin, U: 0 %
Beta Globulin, U: 0 %
Gamma Globulin, U: 0 %
Protein, Ur: 5.8 mg/dL

## 2024-02-24 ENCOUNTER — Encounter: Payer: Self-pay | Admitting: Family Medicine

## 2024-03-22 ENCOUNTER — Ambulatory Visit: Payer: Self-pay | Admitting: Obstetrics and Gynecology

## 2024-06-02 ENCOUNTER — Ambulatory Visit: Admitting: Internal Medicine

## 2024-08-15 ENCOUNTER — Encounter: Admitting: Family Medicine
